# Patient Record
Sex: Female | Born: 1940
Health system: Southern US, Community
[De-identification: ages and names within clinical notes are randomized; demographics above are authoritative.]

## PROBLEM LIST (undated history)

## (undated) DIAGNOSIS — R42 Dizziness and giddiness: Secondary | ICD-10-CM

## (undated) DIAGNOSIS — R634 Abnormal weight loss: Secondary | ICD-10-CM

## (undated) DIAGNOSIS — E875 Hyperkalemia: Secondary | ICD-10-CM

## (undated) DIAGNOSIS — J019 Acute sinusitis, unspecified: Secondary | ICD-10-CM

## (undated) DIAGNOSIS — H9311 Tinnitus, right ear: Secondary | ICD-10-CM

## (undated) DIAGNOSIS — I10 Essential (primary) hypertension: Secondary | ICD-10-CM

## (undated) DIAGNOSIS — D649 Anemia, unspecified: Secondary | ICD-10-CM

## (undated) DIAGNOSIS — N182 Chronic kidney disease, stage 2 (mild): Secondary | ICD-10-CM

## (undated) DIAGNOSIS — Z634 Disappearance and death of family member: Secondary | ICD-10-CM

## (undated) DIAGNOSIS — J189 Pneumonia, unspecified organism: Secondary | ICD-10-CM

## (undated) DIAGNOSIS — I129 Hypertensive chronic kidney disease with stage 1 through stage 4 chronic kidney disease, or unspecified chronic kidney disease: Secondary | ICD-10-CM

## (undated) DIAGNOSIS — T7840XA Allergy, unspecified, initial encounter: Secondary | ICD-10-CM

## (undated) HISTORY — DX: Anemia, unspecified: D64.9

## (undated) HISTORY — DX: Essential (primary) hypertension: I10

## (undated) HISTORY — DX: Abnormal weight loss: R63.4

## (undated) HISTORY — DX: Disappearance and death of family member: Z63.4

## (undated) HISTORY — DX: Tinnitus, right ear: H93.11

## (undated) HISTORY — DX: Chronic kidney disease, stage 2 (mild): N18.2

## (undated) HISTORY — DX: Hypertensive chronic kidney disease with stage 1 through stage 4 chronic kidney disease, or unspecified chronic kidney disease: I12.9

## (undated) HISTORY — DX: Hyperkalemia: E87.5

## (undated) HISTORY — DX: Dizziness and giddiness: R42

## (undated) HISTORY — DX: Acute sinusitis, unspecified: J01.90

## (undated) HISTORY — PX: NO PAST SURGERIES: SHX2092

## (undated) HISTORY — DX: Allergy, unspecified, initial encounter: T78.40XA

## (undated) HISTORY — DX: Pneumonia, unspecified organism: J18.9

---

## 2001-10-22 ENCOUNTER — Emergency Department (HOSPITAL_COMMUNITY): Admission: EM | Admit: 2001-10-22 | Discharge: 2001-10-22 | Payer: Self-pay | Admitting: *Deleted

## 2001-10-22 ENCOUNTER — Encounter: Payer: Self-pay | Admitting: *Deleted

## 2005-01-28 ENCOUNTER — Emergency Department (HOSPITAL_COMMUNITY): Admission: EM | Admit: 2005-01-28 | Discharge: 2005-01-28 | Payer: Self-pay | Admitting: Emergency Medicine

## 2013-03-08 ENCOUNTER — Emergency Department (HOSPITAL_COMMUNITY)
Admission: EM | Admit: 2013-03-08 | Discharge: 2013-03-08 | Disposition: A | Discharge status: Home / Self Care | Payer: Medicare Other | Attending: Emergency Medicine | Admitting: Emergency Medicine

## 2013-03-08 ENCOUNTER — Encounter (HOSPITAL_COMMUNITY): Payer: Self-pay | Admitting: Emergency Medicine

## 2013-03-08 DIAGNOSIS — Z87891 Personal history of nicotine dependence: Secondary | ICD-10-CM | POA: Insufficient documentation

## 2013-03-08 DIAGNOSIS — I10 Essential (primary) hypertension: Secondary | ICD-10-CM | POA: Insufficient documentation

## 2013-03-08 DIAGNOSIS — R42 Dizziness and giddiness: Secondary | ICD-10-CM

## 2013-03-08 HISTORY — DX: Dizziness and giddiness: R42

## 2013-03-08 HISTORY — DX: Essential (primary) hypertension: I10

## 2013-03-08 NOTE — Discharge Instructions (Signed)
Your exam shows you have had an episode of vertigo, which causes a false sense of movement such as a spinning feeling or walls that seem to move.  Most vertigo is caused by a (usually temporary) problem in the inner ear. Rarely, the back part of the brain can cause vertigo (some mini-strokes / TIA's / strokes), but it appears to be a low risk cause for you at this time. It is important to follow-up with your doctor however, to see if you need further testing.  °Do not drive or participate in potentially dangerous activities requiring balance unless off meds (not drowsy) and the vertigo has resolved. °Most of the time benign vertigo is much better after a few days. However, mild unsteadiness may last for up to 3 months in some patients. An MRI scan or other special tests to evaluate your hearing and balance may be needed if the vertigo does not improve or returns in the future. RETURN IMMEDIATELY IF YOU HAVE ANY OF THE FOLLOWING (call 911): °Increasing vertigo, earache, ear drainage, or loss of hearing.  °Severe headache, blurred or double vision, or trouble walking.  °Fainting or poorly responsive, extreme weakness, chest pain, or palpitations.  °Fever, persistent vomiting, or dehydration.  °Numbness, tingling, incoordination, or weakness of the limbs.  °Change in speech, vision, swallowing, understanding, or other concerns. °

## 2013-03-08 NOTE — ED Notes (Signed)
Patient states she had a "lump" behind her right ear that her PCP stated was a swollen gland on 02/10/13. She was given antibiotics for the infection. Patient states she has felt occasional dizziness since this date. Patient has a history of vertigo and hypertension, and states, "It feels the same as my vertigo did before." Patient denies nausea, vomiting, diarrhea, and chest pain.

## 2013-03-08 NOTE — ED Provider Notes (Signed)
CSN: [id number]     Arrival date & time 03/08/13  [id number] History  This chart was scribed for Hurman HornJohn M Negar Sieler, MD by Shari HeritageAisha Amuda, ED Scribe. The patient was seen in room [facility]. Patient's care was started at 9:12 AM.    Chief Complaint  Patient presents with  . Dizziness    The history is provided by the patient. No language interpreter was used.    HPI Comments: Joanne White is a 73 y.o. female with history of vertigo who presents to the Emergency Department complaining of intermittent episodes of sensation of motion for the past 3 weeks. She reports that she has had about 1 episode per week and they typically last a few seconds. Her most recent episode was last night and was accompanied by a rushing sensation in her ears. There was no ear pain or hearing loss. She denies dizziness or lightheadedness, headache, change in speech, vision, swallowing or understanding with these episodes. She denies numbness or weakness on one side of the body and denies incoordination. She has taken meclizine at home with some improvement. Patient states that she was seen by her PCP on 02/10/13 for a "lump" in her right upper neck. She was diagnosed with an infected gland and given antibiotics. She feels that these episodes began after her antibiotic course. She only has an intermittent history of vertigo and this is not a chronic problem. She also has a history of HTN and is treated with lisinopril and atenolol.    Past Medical History  Diagnosis Date  . Vertigo   . Hypertension    History reviewed. No pertinent past surgical history. Family History  Problem Relation Age of Onset  . Hypertension Father   . Heart attack Brother   . Hypertension Brother    History  Substance Use Topics  . Smoking status: Former Games developermoker  . Smokeless tobacco: Never Used  . Alcohol Use: No   OB History   Grav Para Term Preterm Abortions TAB SAB Ect Mult Living                 Review of Systems 10 Systems reviewed  and all are negative for acute change except as noted in the HPI.  Allergies  Review of patient's allergies indicates no known allergies.  Home Medications  No current outpatient prescriptions on file. Triage Vitals: BP 179/65  Pulse 65  Temp(Src) 97.6 F (36.4 C) (Oral)  Resp 16  SpO2 100% Physical Exam  Nursing note and vitals reviewed. Constitutional:  Awake, alert, nontoxic appearance with baseline speech for patient.  HENT:  Head: Atraumatic.  Right Ear: Tympanic membrane normal.  Left Ear: Tympanic membrane normal.  Mouth/Throat: No oropharyngeal exudate.  Eyes: EOM are normal. Pupils are equal, round, and reactive to light. Right eye exhibits no discharge. Left eye exhibits no discharge.  Neck: Neck supple.  Cardiovascular: Normal rate and regular rhythm.   No murmur heard. Pulmonary/Chest: Effort normal and breath sounds normal. No stridor. No respiratory distress. She has no wheezes. She has no rales. She exhibits no tenderness.  Abdominal: Soft. Bowel sounds are normal. She exhibits no mass. There is no tenderness. There is no rebound.  Musculoskeletal: She exhibits no tenderness.  Baseline ROM, moves extremities with no obvious new focal weakness.  Lymphadenopathy:    She has no cervical adenopathy.  Neurological:  Awake, alert, cooperative and aware of situation; motor strength bilaterally; sensation normal to light touch bilaterally; peripheral visual fields full to confrontation; no facial  asymmetry; tongue midline; major cranial nerves appear intact; no pronator drift, normal finger to nose bilaterally, baseline gait without new ataxia. No nystagmus. Negative test of skew.  Skin: No rash noted.  Psychiatric: She has a normal mood and affect.    ED Course  Procedures (including critical care time) ECG: Normal sinus rhythm, ventricular rate 64, normal axis, normal intervals, nonspecific ST abnormality, no comparison ECG immediately available: Muse interface not  working. DIAGNOSTIC STUDIES: Oxygen Saturation is 100% on room air, normal by my interpretation.    COORDINATION OF CARE: 9:23 AM- Pt stable in ED with no significant deterioration in condition. Patient informed of clinical course, understand medical decision-making process, and agree with plan.   MDM   1. Vertigo      I doubt any other EMC precluding discharge at this time including, but not necessarily limited to the following: Stroke  I personally performed the services described in this documentation, which was scribed in my presence. The recorded information has been reviewed and is accurate.    Hurman Horn, MD 03/08/13 [id number]

## 2014-02-24 ENCOUNTER — Encounter (HOSPITAL_COMMUNITY): Payer: Self-pay | Admitting: Cardiology

## 2014-02-24 ENCOUNTER — Emergency Department (HOSPITAL_COMMUNITY)
Admission: EM | Admit: 2014-02-24 | Discharge: 2014-02-24 | Disposition: A | Discharge status: Home / Self Care | Payer: Medicare Other | Attending: Emergency Medicine | Admitting: Emergency Medicine

## 2014-02-24 ENCOUNTER — Emergency Department (HOSPITAL_COMMUNITY): Payer: Medicare Other

## 2014-02-24 DIAGNOSIS — R1011 Right upper quadrant pain: Secondary | ICD-10-CM | POA: Insufficient documentation

## 2014-02-24 DIAGNOSIS — I10 Essential (primary) hypertension: Secondary | ICD-10-CM | POA: Diagnosis not present

## 2014-02-24 DIAGNOSIS — Z87891 Personal history of nicotine dependence: Secondary | ICD-10-CM | POA: Diagnosis not present

## 2014-02-24 DIAGNOSIS — K8020 Calculus of gallbladder without cholecystitis without obstruction: Secondary | ICD-10-CM | POA: Diagnosis not present

## 2014-02-24 DIAGNOSIS — K8080 Other cholelithiasis without obstruction: Secondary | ICD-10-CM | POA: Diagnosis not present

## 2014-02-24 LAB — URINALYSIS, ROUTINE W REFLEX MICROSCOPIC
Glucose, UA: NEGATIVE mg/dL
Leukocytes, UA: NEGATIVE
Nitrite: NEGATIVE
Protein, ur: NEGATIVE mg/dL
Specific Gravity, Urine: 1.025 (ref 1.005–1.030)
Urobilinogen, UA: 0.2 mg/dL (ref 0.0–1.0)
pH: 5.5 (ref 5.0–8.0)

## 2014-02-24 LAB — LIPASE, BLOOD: Lipase: 24 U/L (ref 11–59)

## 2014-02-24 LAB — CBC WITH DIFFERENTIAL/PLATELET
Basophils Absolute: 0 10*3/uL (ref 0.0–0.1)
Basophils Relative: 0 % (ref 0–1)
Eosinophils Absolute: 0.1 10*3/uL (ref 0.0–0.7)
Eosinophils Relative: 2 % (ref 0–5)
HCT: 37 % (ref 36.0–46.0)
Hemoglobin: 13.3 g/dL (ref 12.0–15.0)
Lymphocytes Relative: 24 % (ref 12–46)
Lymphs Abs: 1.1 10*3/uL (ref 0.7–4.0)
MCH: 34.9 pg — ABNORMAL HIGH (ref 26.0–34.0)
MCHC: 35.9 g/dL (ref 30.0–36.0)
MCV: 97.1 fL (ref 78.0–100.0)
Monocytes Absolute: 0.4 10*3/uL (ref 0.1–1.0)
Monocytes Relative: 9 % (ref 3–12)
Neutro Abs: 3.1 10*3/uL (ref 1.7–7.7)
Neutrophils Relative %: 65 % (ref 43–77)
Platelets: 301 10*3/uL (ref 150–400)
RBC: 3.81 MIL/uL — ABNORMAL LOW (ref 3.87–5.11)
RDW: 12.4 % (ref 11.5–15.5)
WBC: 4.8 10*3/uL (ref 4.0–10.5)

## 2014-02-24 LAB — COMPREHENSIVE METABOLIC PANEL
ALT: 17 U/L (ref 0–35)
AST: 17 U/L (ref 0–37)
Albumin: 4.2 g/dL (ref 3.5–5.2)
Alkaline Phosphatase: 48 U/L (ref 39–117)
Anion gap: 7 (ref 5–15)
BUN: 15 mg/dL (ref 6–23)
CO2: 28 mmol/L (ref 19–32)
Calcium: 9.4 mg/dL (ref 8.4–10.5)
Chloride: 95 mEq/L — ABNORMAL LOW (ref 96–112)
Creatinine, Ser: 1.15 mg/dL — ABNORMAL HIGH (ref 0.50–1.10)
GFR calc Af Amer: 54 mL/min — ABNORMAL LOW (ref >90)
GFR calc non Af Amer: 46 mL/min — ABNORMAL LOW (ref >90)
Glucose, Bld: 115 mg/dL — ABNORMAL HIGH (ref 70–99)
Potassium: 4.8 mmol/L (ref 3.5–5.1)
Sodium: 130 mmol/L — ABNORMAL LOW (ref 135–145)
Total Bilirubin: 0.6 mg/dL (ref 0.3–1.2)
Total Protein: 6.7 g/dL (ref 6.0–8.3)

## 2014-02-24 LAB — URINE MICROSCOPIC-ADD ON

## 2014-02-24 NOTE — ED Notes (Signed)
Right sided abdominal  pain off and on times 2 days.  Worse this morning.  Some nausea.

## 2014-02-24 NOTE — ED Provider Notes (Signed)
CSN: [id number]     Arrival date & time 02/24/14  0914 History   First MD Initiated Contact with Patient 02/24/14 [phone]     Chief Complaint  Patient presents with  . Abdominal Pain     (Consider location/radiation/quality/duration/timing/severity/associated sxs/prior Treatment) HPI Comments: Pt c/o right upper abdominal pain that has been intermittent for the last 2 days. Pain was worse this morning. She states that she had a lot of gas and the pain resolved. Has had some nausea, no v/d, no fever.hasn't taken anything for symptoms. No cp or sob. Is not having any symptoms at this time. Didn't have breakfast this morning because of nausea, hasn't noticed it being associated with food. No cough or uri symptoms  The history is provided by the patient.    Past Medical History  Diagnosis Date  . Vertigo   . Hypertension    History reviewed. No pertinent past surgical history. Family History  Problem Relation Age of Onset  . Hypertension Father   . Heart attack Brother   . Hypertension Brother    History  Substance Use Topics  . Smoking status: Former Games developermoker  . Smokeless tobacco: Never Used  . Alcohol Use: No   OB History    No data available     Review of Systems  All other systems reviewed and are negative.     Allergies  Codeine  Home Medications   Prior to Admission medications   Not on File   BP 142/64 mmHg  Pulse 67  Temp(Src) 97.7 F (36.5 C) (Oral)  Resp 16  Ht 5\' 3"  (1.6 m)  Wt 136 lb (61.689 kg)  BMI 24.10 kg/m2  SpO2 100% Physical Exam  Constitutional: She is oriented to person, place, and time. She appears well-developed and well-nourished.  HENT:  Head: Atraumatic.  Neck: Normal range of motion. Neck supple.  Cardiovascular: Normal rate and regular rhythm.   Pulmonary/Chest: Effort normal and breath sounds normal.  Abdominal: Soft. Bowel sounds are normal. There is no tenderness.  Musculoskeletal: Normal range of motion.  Neurological: She is  oriented to person, place, and time.  Skin: Skin is warm and dry.  Psychiatric: She has a normal mood and affect.  Nursing note and vitals reviewed.   ED Course  Procedures (including critical care time) Labs Review Labs Reviewed  COMPREHENSIVE METABOLIC PANEL - Abnormal; Notable for the following:    Sodium 130 (*)    Chloride 95 (*)    Glucose, Bld 115 (*)    Creatinine, Ser 1.15 (*)    GFR calc non Af Amer 46 (*)    GFR calc Af Amer 54 (*)    All other components within normal limits  CBC WITH DIFFERENTIAL - Abnormal; Notable for the following:    RBC 3.81 (*)    MCH 34.9 (*)    All other components within normal limits  URINALYSIS, ROUTINE W REFLEX MICROSCOPIC - Abnormal; Notable for the following:    Hgb urine dipstick TRACE (*)    Bilirubin Urine SMALL (*)    Ketones, ur TRACE (*)    All other components within normal limits  URINE MICROSCOPIC-ADD ON - Abnormal; Notable for the following:    Bacteria, UA FEW (*)    All other components within normal limits  LIPASE, BLOOD    Imaging Review [place] Abdomen Complete  02/24/2014   CLINICAL DATA:  Right upper quadrant pain off and on for 2 days with nausea.  EXAM: ULTRASOUND ABDOMEN COMPLETE  COMPARISON:  None.  FINDINGS: Gallbladder: 2 small mobile gallstones are noted within the gallbladder, the largest measuring 11 mm. No wall thickening. Negative sonographic Murphy's.  Common bile duct: Diameter: Normal caliber, 6 mm.  Liver: No focal lesion identified. Within normal limits in parenchymal echogenicity.  IVC: No abnormality visualized.  Pancreas: Visualized portion unremarkable.  Spleen: Size and appearance within normal limits.  Right Kidney: Length: 10.4 cm. Echogenicity within normal limits. No mass or hydronephrosis visualized.  Left Kidney: Length: 10.1 cm. Echogenicity within normal limits. No mass or hydronephrosis visualized.  Abdominal aorta: No aneurysm visualized.  Other findings: None.  IMPRESSION: Cholelithiasis.  No  sonographic evidence of acute cholecystitis.   Electronically Signed   By: Charlett Nose M.D.   On: 02/24/2014 11:57     EKG Interpretation None      MDM   Final diagnoses:  Right upper quadrant abdominal pain  Cholelithiasis without cholecystitis    Pt continues to be comfortable at this time. Discussed with pt follow up. Pt requesting [facility].     Teressa Lower, NP 02/24/14 1217  Samuel Jester, DO 02/26/14 1347

## 2014-02-24 NOTE — Discharge Instructions (Signed)
Cholelithiasis °Cholelithiasis (also called gallstones) is a form of gallbladder disease in which gallstones form in your gallbladder. The gallbladder is an organ that stores bile made in the liver, which helps digest fats. Gallstones begin as small crystals and slowly grow into stones. Gallstone pain occurs when the gallbladder spasms and a gallstone is blocking the duct. Pain can also occur when a stone passes out of the duct.  °RISK FACTORS °· Being female.   °· Having multiple pregnancies. Health care providers sometimes advise removing diseased gallbladders before future pregnancies.   °· Being obese. °· Eating a diet heavy in fried foods and fat.   °· Being older than 60 years and increasing age.   °· Prolonged use of medicines containing female hormones.   °· Having diabetes mellitus.   °· Rapidly losing weight.   °· Having a family history of gallstones (heredity).   °SYMPTOMS °· Nausea.   °· Vomiting. °· Abdominal pain.   °· Yellowing of the skin (jaundice).   °· Sudden pain. It may persist from several minutes to several hours. °· Fever.   °· Tenderness to the touch.  °In some cases, when gallstones do not move into the bile duct, people have no pain or symptoms. These are called "silent" gallstones.  °TREATMENT °Silent gallstones do not need treatment. In severe cases, emergency surgery may be required. Options for treatment include: °· Surgery to remove the gallbladder. This is the most common treatment. °· Medicines. These do not always work and may take 6-12 months or more to work. °· Shock wave treatment (extracorporeal biliary lithotripsy). In this treatment an ultrasound machine sends shock waves to the gallbladder to break gallstones into smaller pieces that can pass into the intestines or be dissolved by medicine. °HOME CARE INSTRUCTIONS  °· Only take over-the-counter or prescription medicines for pain, discomfort, or fever as directed by your health care provider.   °· Follow a low-fat diet until  seen again by your health care provider. Fat causes the gallbladder to contract, which can result in pain.   °· Follow up with your health care provider as directed. Attacks are almost always recurrent and surgery is usually required for permanent treatment.   °SEEK IMMEDIATE MEDICAL CARE IF:  °· Your pain increases and is not controlled by medicines.   °· You have a fever or persistent symptoms for more than 2-3 days.   °· You have a fever and your symptoms suddenly get worse.   °· You have persistent nausea and vomiting.   °MAKE SURE YOU:  °· Understand these instructions. °· Will watch your condition. °· Will get help right away if you are not doing well or get worse. °Document Released: 01/26/2005 Document Revised: 10/02/2012 Document Reviewed: 07/24/2012 °[employer]® Patient Information ©2015 [employer]. This information is not intended to replace advice given to you by your health care provider. Make sure you discuss any questions you have with your health care provider. ° °

## 2014-03-02 ENCOUNTER — Inpatient Hospital Stay (HOSPITAL_COMMUNITY)
Admission: EM | Admit: 2014-03-02 | Discharge: 2014-03-05 | DRG: 445 | Disposition: A | Discharge status: Home / Self Care | Payer: Medicare Other | Attending: Family Medicine | Admitting: Family Medicine

## 2014-03-02 ENCOUNTER — Encounter (HOSPITAL_COMMUNITY): Payer: Self-pay | Admitting: Emergency Medicine

## 2014-03-02 DIAGNOSIS — R109 Unspecified abdominal pain: Secondary | ICD-10-CM | POA: Diagnosis present

## 2014-03-02 DIAGNOSIS — I10 Essential (primary) hypertension: Secondary | ICD-10-CM

## 2014-03-02 DIAGNOSIS — Z79899 Other long term (current) drug therapy: Secondary | ICD-10-CM

## 2014-03-02 DIAGNOSIS — N182 Chronic kidney disease, stage 2 (mild): Secondary | ICD-10-CM | POA: Diagnosis present

## 2014-03-02 DIAGNOSIS — Z7982 Long term (current) use of aspirin: Secondary | ICD-10-CM | POA: Diagnosis not present

## 2014-03-02 DIAGNOSIS — K8020 Calculus of gallbladder without cholecystitis without obstruction: Secondary | ICD-10-CM | POA: Diagnosis not present

## 2014-03-02 DIAGNOSIS — K8000 Calculus of gallbladder with acute cholecystitis without obstruction: Secondary | ICD-10-CM | POA: Diagnosis not present

## 2014-03-02 DIAGNOSIS — R1011 Right upper quadrant pain: Secondary | ICD-10-CM | POA: Diagnosis present

## 2014-03-02 DIAGNOSIS — K8010 Calculus of gallbladder with chronic cholecystitis without obstruction: Secondary | ICD-10-CM | POA: Diagnosis not present

## 2014-03-02 DIAGNOSIS — I129 Hypertensive chronic kidney disease with stage 1 through stage 4 chronic kidney disease, or unspecified chronic kidney disease: Secondary | ICD-10-CM | POA: Diagnosis not present

## 2014-03-02 DIAGNOSIS — Z87891 Personal history of nicotine dependence: Secondary | ICD-10-CM

## 2014-03-02 DIAGNOSIS — E871 Hypo-osmolality and hyponatremia: Secondary | ICD-10-CM | POA: Diagnosis not present

## 2014-03-02 HISTORY — DX: Essential (primary) hypertension: I10

## 2014-03-02 LAB — COMPREHENSIVE METABOLIC PANEL
ALT: 14 U/L (ref 0–35)
AST: 18 U/L (ref 0–37)
Albumin: 4.4 g/dL (ref 3.5–5.2)
Alkaline Phosphatase: 52 U/L (ref 39–117)
Anion gap: 10 (ref 5–15)
BUN: 16 mg/dL (ref 6–23)
CO2: 24 mmol/L (ref 19–32)
Calcium: 9.4 mg/dL (ref 8.4–10.5)
Chloride: 90 mEq/L — ABNORMAL LOW (ref 96–112)
Creatinine, Ser: 1.07 mg/dL (ref 0.50–1.10)
GFR calc Af Amer: 59 mL/min — ABNORMAL LOW (ref >90)
GFR calc non Af Amer: 51 mL/min — ABNORMAL LOW (ref >90)
Glucose, Bld: 92 mg/dL (ref 70–99)
Potassium: 3.9 mmol/L (ref 3.5–5.1)
Sodium: 124 mmol/L — ABNORMAL LOW (ref 135–145)
Total Bilirubin: 0.7 mg/dL (ref 0.3–1.2)
Total Protein: 7.1 g/dL (ref 6.0–8.3)

## 2014-03-02 LAB — CBC WITH DIFFERENTIAL/PLATELET
Basophils Absolute: 0 10*3/uL (ref 0.0–0.1)
Basophils Relative: 0 % (ref 0–1)
Eosinophils Absolute: 0 10*3/uL (ref 0.0–0.7)
Eosinophils Relative: 1 % (ref 0–5)
HCT: 37.7 % (ref 36.0–46.0)
Hemoglobin: 13.6 g/dL (ref 12.0–15.0)
Lymphocytes Relative: 25 % (ref 12–46)
Lymphs Abs: 1.4 10*3/uL (ref 0.7–4.0)
MCH: 34.2 pg — ABNORMAL HIGH (ref 26.0–34.0)
MCHC: 36.1 g/dL — ABNORMAL HIGH (ref 30.0–36.0)
MCV: 94.7 fL (ref 78.0–100.0)
Monocytes Absolute: 0.5 10*3/uL (ref 0.1–1.0)
Monocytes Relative: 8 % (ref 3–12)
Neutro Abs: 3.8 10*3/uL (ref 1.7–7.7)
Neutrophils Relative %: 66 % (ref 43–77)
Platelets: 307 10*3/uL (ref 150–400)
RBC: 3.98 MIL/uL (ref 3.87–5.11)
RDW: 12 % (ref 11.5–15.5)
WBC: 5.7 10*3/uL (ref 4.0–10.5)

## 2014-03-02 LAB — BASIC METABOLIC PANEL
Anion gap: 8 (ref 5–15)
BUN: 17 mg/dL (ref 6–23)
CO2: 28 mmol/L (ref 19–32)
Calcium: 8.9 mg/dL (ref 8.4–10.5)
Chloride: 89 mEq/L — ABNORMAL LOW (ref 96–112)
Creatinine, Ser: 1.23 mg/dL — ABNORMAL HIGH (ref 0.50–1.10)
GFR calc Af Amer: 50 mL/min — ABNORMAL LOW (ref >90)
GFR calc non Af Amer: 43 mL/min — ABNORMAL LOW (ref >90)
Glucose, Bld: 94 mg/dL (ref 70–99)
Potassium: 3.9 mmol/L (ref 3.5–5.1)
Sodium: 125 mmol/L — ABNORMAL LOW (ref 135–145)

## 2014-03-02 LAB — LIPASE, BLOOD: Lipase: 29 U/L (ref 11–59)

## 2014-03-02 MED ORDER — ONDANSETRON 8 MG PO TBDP
8.0000 mg | ORAL_TABLET | Freq: Once | ORAL | Status: AC
  Administered 2014-03-02: 8 mg via ORAL
  Filled 2014-03-02: qty 1

## 2014-03-02 MED ORDER — HEPARIN SODIUM (PORCINE) 5000 UNIT/ML IJ SOLN
5000.0000 [IU] | Freq: Three times a day (TID) | INTRAMUSCULAR | Status: DC
  Administered 2014-03-02 – 2014-03-05 (×7): 5000 [IU] via SUBCUTANEOUS
  Filled 2014-03-02 (×12): qty 1

## 2014-03-02 MED ORDER — OXYCODONE HCL 5 MG PO TABS
5.0000 mg | ORAL_TABLET | ORAL | Status: DC | PRN
  Administered 2014-03-04 – 2014-03-05 (×3): 5 mg via ORAL
  Filled 2014-03-02 (×3): qty 1

## 2014-03-02 MED ORDER — ONDANSETRON HCL 4 MG PO TABS
4.0000 mg | ORAL_TABLET | Freq: Four times a day (QID) | ORAL | Status: DC | PRN
  Administered 2014-03-05: 4 mg via ORAL
  Filled 2014-03-02: qty 1

## 2014-03-02 MED ORDER — ASPIRIN EC 81 MG PO TBEC
81.0000 mg | DELAYED_RELEASE_TABLET | Freq: Every day | ORAL | Status: DC
  Administered 2014-03-03 – 2014-03-05 (×2): 81 mg via ORAL
  Filled 2014-03-02 (×5): qty 1

## 2014-03-02 MED ORDER — LISINOPRIL 40 MG PO TABS
40.0000 mg | ORAL_TABLET | Freq: Every day | ORAL | Status: DC
  Administered 2014-03-03 – 2014-03-05 (×2): 40 mg via ORAL
  Filled 2014-03-02 (×5): qty 1

## 2014-03-02 MED ORDER — ONDANSETRON HCL 4 MG/2ML IJ SOLN: 4.0000 mg | Freq: Four times a day (QID) | INTRAMUSCULAR | Status: DC | PRN

## 2014-03-02 MED ORDER — SODIUM CHLORIDE 0.9 % IV SOLN
INTRAVENOUS | Status: DC
  Administered 2014-03-02 – 2014-03-03 (×2): via INTRAVENOUS
  Administered 2014-03-03 – 2014-03-04 (×2): 1000 mL via INTRAVENOUS

## 2014-03-02 MED ORDER — OXYCODONE-ACETAMINOPHEN 5-325 MG PO TABS
2.0000 | ORAL_TABLET | Freq: Once | ORAL | Status: AC
Start: 2014-03-02 — End: 2014-03-02
  Administered 2014-03-02: 2 via ORAL
  Filled 2014-03-02: qty 2

## 2014-03-02 NOTE — ED Provider Notes (Signed)
CSN: [id number]     Arrival date & time 03/02/14  1009 History   First MD Initiated Contact with Patient 03/02/14 1014     Chief Complaint  Patient presents with  . Abdominal Pain     (Consider location/radiation/quality/duration/timing/severity/associated sxs/prior Treatment) HPI   74 year old female with abdominal pain and nausea. Patient was seen for similar symptoms in the emergency room on January 12. Diagnosed with cholelithiasis. She reports upcoming surgical appointment with [facility] this Friday. She's had increased nausea and anorexia. No vomiting. Her pain has not significantly changed with regards to character or location. She reports that she was not prescribed any pain or nausea medication. No fever. Urinary complaints. No respiratory complaints. No sick contacts. No diarrhea.   Past Medical History  Diagnosis Date  . Vertigo   . Hypertension    History reviewed. No pertinent past surgical history. Family History  Problem Relation Age of Onset  . Hypertension Father   . Heart attack Brother   . Hypertension Brother    History  Substance Use Topics  . Smoking status: Former Games developermoker  . Smokeless tobacco: Never Used  . Alcohol Use: No   OB History    No data available     Review of Systems  All systems reviewed and negative, other than as noted in HPI.   Allergies  Codeine  Home Medications   Prior to Admission medications   Medication Sig Start Date End Date Taking? Authorizing Provider  aspirin EC 81 MG tablet Take 81 mg by mouth daily.    Historical Provider, MD  hydrochlorothiazide (HYDRODIURIL) 25 MG tablet Take 25 mg by mouth daily. 02/09/14   Historical Provider, MD  lisinopril (PRINIVIL,ZESTRIL) 40 MG tablet Take 40 mg by mouth daily. 02/09/14   Historical Provider, MD  Omega-3 Fatty Acids (FISH OIL) 1000 MG CAPS Take 1,000 mg by mouth 2 (two) times daily.    Historical Provider, MD   BP 150/70 mmHg  Pulse 78  Temp(Src) 97.5 F  (36.4 C) (Oral)  Resp 15  SpO2 99% Physical Exam  Constitutional: She appears well-developed and well-nourished. No distress.  HENT:  Head: Normocephalic and atraumatic.  Eyes: Conjunctivae are normal. Right eye exhibits no discharge. Left eye exhibits no discharge.  Neck: Neck supple.  Cardiovascular: Normal rate, regular rhythm and normal heart sounds.  Exam reveals no gallop and no friction rub.   No murmur heard. Pulmonary/Chest: Effort normal and breath sounds normal. No respiratory distress.  Abdominal: Soft. She exhibits no distension. There is tenderness.  Mild epigastric tenderness and to lesser degree in the right upper quadrant. No rebound or guarding. No distention.  Genitourinary:  No cva tenderness  Musculoskeletal: She exhibits no edema or tenderness.  Neurological: She is alert.  Skin: Skin is warm and dry.  Psychiatric: She has a normal mood and affect. Her behavior is normal. Thought content normal.  Nursing note and vitals reviewed.   ED Course  Procedures (including critical care time) Labs Review Labs Reviewed  COMPREHENSIVE METABOLIC PANEL - Abnormal; Notable for the following:    Sodium 124 (*)    Chloride 90 (*)    GFR calc non Af Amer 51 (*)    GFR calc Af Amer 59 (*)    All other components within normal limits  CBC WITH DIFFERENTIAL - Abnormal; Notable for the following:    MCH 34.2 (*)    MCHC 36.1 (*)    All other components within normal limits  BASIC METABOLIC  PANEL - Abnormal; Notable for the following:    Sodium 125 (*)    Chloride 89 (*)    Creatinine, Ser 1.23 (*)    GFR calc non Af Amer 43 (*)    GFR calc Af Amer 50 (*)    All other components within normal limits  BASIC METABOLIC PANEL - Abnormal; Notable for the following:    Sodium 130 (*)    Creatinine, Ser 1.21 (*)    GFR calc non Af Amer 44 (*)    GFR calc Af Amer 51 (*)    All other components within normal limits  CBC - Abnormal; Notable for the following:    RBC 3.71 (*)     HCT 35.7 (*)    MCH 34.8 (*)    MCHC 36.1 (*)    All other components within normal limits  BASIC METABOLIC PANEL - Abnormal; Notable for the following:    Sodium 132 (*)    Creatinine, Ser 1.21 (*)    GFR calc non Af Amer 44 (*)    GFR calc Af Amer 51 (*)    All other components within normal limits  BASIC METABOLIC PANEL - Abnormal; Notable for the following:    Sodium 132 (*)    Potassium 3.4 (*)    Glucose, Bld 164 (*)    Creatinine, Ser 1.15 (*)    GFR calc non Af Amer 46 (*)    GFR calc Af Amer 54 (*)    All other components within normal limits  LIPASE, BLOOD    Imaging Review No results found.   EKG Interpretation None      MDM   Final diagnoses:  Hyponatremia   74yf with abdominal pain and nausea/anorexia. Recently seen for similar symptoms at [facility] and had US showing cholelithiasis w/o [place] evidence of cholecystitis. Lipase/LFTs were normal. Continued symptoms. Reports upcoming appointment with [facility] on Friday. Has not been taking anything for pain/nausea.   Her exam is fairly benign. Mild epigastric/RUQ tenderness w/o peritoneal signs. Afebrile. Appears well. Hyponatremic on last ED evaluation. Worsened today. Symptoms may be from biliary colic. Hyponatremia can cause nausea/anorexia as well though.    Raeford Razor, MD 03/03/14 1344

## 2014-03-02 NOTE — ED Notes (Signed)
Pt with Dx of gall stones, has appointment with presurgery on Friday, reports to ED for nausea and pain to RUQ.

## 2014-03-02 NOTE — Consult Note (Signed)
Reason for Consult:  Cholelithiasis with abdominal pain, nausea/anorexia Referring Physician: [name] is an 74 y.o. female.  HPI: this is a normally healthy 74 y/o female.  Last week she started having pain in the RUQ, and was seen at [facility].  Work up there showed an NA of 130 and a creatinine of 1.15. Other labs were normal.  US showed 2 mobile gallstones within the GB, the largest was 62m, there was no GB wall thickening or fluid, around the GB.  She was discharged home with recommendations to follow up with surgery for her GB.  She is set up to see Dr. RRosendo Groson Friday 03/06/14.  [facility] she has been doing well until Saturday she had some pain and nausea.  She went to bed and did fine.  She had an egg for breakfast the following Sunday AM, and did well initially then got some pain and nausea again.  No vomiting.  She was able to sleep last PM, but had pain again this AM so her son brought her to the ED for evaluation again.  Work up here now shows her Na down to 124, she has been eating mostly crackers since she got sick on Sunday after breakfast.  She says her pain is better now after medicine.  She is really asking for some food now and she is to be admitted for her hyponatremia.  We are ask to see and discuss need for surgery.  Past Medical History  Diagnosis Date  . Vertigo    Tobacco use Quit 2 years ago  44 years use prior to quitting.  <1PPD  . Hypertension     History reviewed. No pertinent past surgical history.  No surgical history  Family History  Problem Relation Age of Onset  . Hypertension Father   . Heart attack Brother   . Hypertension Brother     Social History:  reports that she has quit smoking. She has never used smokeless tobacco. She reports that she does not drink alcohol. Her drug history is not on file. Etoh:  Social Drugs:  None Tobacco:  < 1 PPD for 44 years, quit 2 years ago. Retired - lives alone     Allergies:   Allergies  Allergen Reactions  . Codeine Nausea And Vomiting   Prior to Admission medications   Medication Sig Start Date End Date Taking? Authorizing Provider  aspirin EC 81 MG tablet Take 81 mg by mouth daily.   Yes Historical Provider, MD  hydrochlorothiazide (HYDRODIURIL) 25 MG tablet Take 25 mg by mouth daily. 02/09/14  Yes Historical Provider, MD  lisinopril (PRINIVIL,ZESTRIL) 40 MG tablet Take 40 mg by mouth daily. 02/09/14  Yes Historical Provider, MD  Omega-3 Fatty Acids (FISH OIL) 1000 MG CAPS Take 1,000 mg by mouth 2 (two) times daily.   Yes Historical Provider, MD     Anti-infectives    None      Results for orders placed or performed during the hospital encounter of 03/02/14 (from the past 48 hour(s))  Comprehensive metabolic panel     Status: Abnormal   Collection Time: 03/02/14 11:25 AM  Result Value Ref Range   Sodium 124 (L) 135 - 145 mmol/L    Comment: Please note change in reference range.   Potassium 3.9 3.5 - 5.1 mmol/L    Comment: Please note change in reference range.   Chloride 90 (L) 96 - 112 mEq/L   CO2 24 19 - 32  mmol/L   Glucose, Bld 92 70 - 99 mg/dL   BUN 16 6 - 23 mg/dL   Creatinine, Ser 1.07 0.50 - 1.10 mg/dL   Calcium 9.4 8.4 - 10.5 mg/dL   Total Protein 7.1 6.0 - 8.3 g/dL   Albumin 4.4 3.5 - 5.2 g/dL   AST 18 0 - 37 U/L   ALT 14 0 - 35 U/L   Alkaline Phosphatase 52 39 - 117 U/L   Total Bilirubin 0.7 0.3 - 1.2 mg/dL   GFR calc non Af Amer 51 (L) >90 mL/min   GFR calc Af Amer 59 (L) >90 mL/min    Comment: (NOTE) The eGFR has been calculated using the CKD EPI equation. This calculation has not been validated in all clinical situations. eGFR's persistently <90 mL/min signify possible Chronic Kidney Disease.    Anion gap 10 5 - 15  Lipase, blood     Status: None   Collection Time: 03/02/14 11:25 AM  Result Value Ref Range   Lipase 29 11 - 59 U/L  CBC with Differential     Status: Abnormal   Collection Time: 03/02/14 11:25 AM  Result  Value Ref Range   WBC 5.7 4.0 - 10.5 K/uL   RBC 3.98 3.87 - 5.11 MIL/uL   Hemoglobin 13.6 12.0 - 15.0 g/dL   HCT 37.7 36.0 - 46.0 %   MCV 94.7 78.0 - 100.0 fL   MCH 34.2 (H) 26.0 - 34.0 pg   MCHC 36.1 (H) 30.0 - 36.0 g/dL   RDW 12.0 11.5 - 15.5 %   Platelets 307 150 - 400 K/uL   Neutrophils Relative % 66 43 - 77 %   Neutro Abs 3.8 1.7 - 7.7 K/uL   Lymphocytes Relative 25 12 - 46 %   Lymphs Abs 1.4 0.7 - 4.0 K/uL   Monocytes Relative 8 3 - 12 %   Monocytes Absolute 0.5 0.1 - 1.0 K/uL   Eosinophils Relative 1 0 - 5 %   Eosinophils Absolute 0.0 0.0 - 0.7 K/uL   Basophils Relative 0 0 - 1 %   Basophils Absolute 0.0 0.0 - 0.1 K/uL    No results found.  Review of Systems  Constitutional: Negative for fever, chills, weight loss, malaise/fatigue and diaphoresis.  HENT: Negative.   Eyes: Negative.   Respiratory: Negative.   Gastrointestinal: Positive for nausea and abdominal pain (ruq ). Negative for heartburn, vomiting, diarrhea, constipation, blood in stool and melena.  Genitourinary: Negative.   Musculoskeletal: Negative.   Neurological: Negative.   Endo/Heme/Allergies: Negative.   Psychiatric/Behavioral: Negative.    Blood pressure 150/70, pulse 78, temperature 97.5 F (36.4 C), temperature source Oral, resp. rate 15, SpO2 99 %. Physical Exam  Constitutional: She is oriented to person, place, and time. She appears well-developed and well-nourished. No distress.  HENT:  Head: Normocephalic and atraumatic.  Nose: Nose normal.  Eyes: Conjunctivae and EOM are normal. Pupils are equal, round, and reactive to light. Right eye exhibits no discharge. Left eye exhibits no discharge. No scleral icterus.  Neck: Normal range of motion. Neck supple. No JVD present. No tracheal deviation present. No thyromegaly present.  Cardiovascular: Normal rate, regular rhythm, normal heart sounds and intact distal pulses.   No murmur heard. Respiratory: Effort normal and breath sounds normal. No  respiratory distress. She has no wheezes. She has no rales. She exhibits no tenderness.  GI: Soft. Bowel sounds are normal. She exhibits no distension and no mass. There is tenderness (some mild tenderness RUQ).  There is no rebound and no guarding.  Musculoskeletal: She exhibits no edema or tenderness.  Lymphadenopathy:    She has no cervical adenopathy.  Neurological: She is alert and oriented to person, place, and time. No cranial nerve deficit.  Skin: Skin is warm and dry. No rash noted. She is not diaphoretic. No erythema. No pallor.  Psychiatric: She has a normal mood and affect. Her behavior is normal. Judgment and thought content normal.    Assessment/Plan: 1.  Hyponatremia 2.  Cholelithiasis with some possible biliary colic 3.  Hypertension. 4.  Hx of tobacco use  Plan:  Currently she is not symptomatic, work up is unremarkable right now.  She is being admitted for hyponatremia, and I will discuss with Dr. Marcello Moores.  Once her hyponatremia is better it may be easier to do her GB while she is here.  Will look at labs, workup and schedule to make final decision.  Ousmane Seeman 03/02/2014, 1:38 PM

## 2014-03-02 NOTE — H&P (Signed)
[facility] History and Physical  Joanne White ZOX:[id number] DOB: [birth year] DOA: 03/02/2014  Referring physician: Dr. Juleen China PCP: Catalina Pizza, MD   Chief Complaint: Abdominal discomfort and low sodium levels  HPI: Joanne White is a 74 y.o. female  Patient states that since last week she has developed abdominal discomfort. Was seen in the emergency department a few days ago and was told she had cholelithiasis. Was discharged home and since then has had abdominal discomfort on and off. Presented again today because of her abdominal discomfort. General surgery contacted and will follow-up on patient while she is in house. The patient reports that within those several days she has not been eating well. She denies any bright red blood per rectum or fevers. Given persistence symptoms were consulted for further medical evaluation recommendations.   Review of Systems:  Constitutional:  No weight loss, night sweats, Fevers, chills, fatigue.  HEENT:  No headaches, Difficulty swallowing,Tooth/dental problems,Sore throat,  No sneezing, itching, ear ache, nasal congestion, post nasal drip,  Cardio-vascular:  No chest pain, Orthopnea, PND, swelling in lower extremities, anasarca, dizziness, palpitations  GI:  No heartburn, indigestion, + abdominal pain, + nausea, vomiting, diarrhea, change in bowel habits, + loss of appetite  Resp:  No shortness of breath with exertion or at rest. No excess mucus, no productive cough, No non-productive cough, No coughing up of blood.No change in color of mucus.No wheezing.No chest wall deformity  Skin:  no rash or lesions.  GU:  no dysuria, change in color of urine, no urgency or frequency. No flank pain.  Musculoskeletal:  No joint pain or swelling. No decreased range of motion. No back pain.  Psych:  No change in mood or affect. No depression or anxiety. No memory loss.   Past Medical History  Diagnosis Date  . Vertigo   . Hypertension    Past  Surgical History  Procedure Laterality Date  . No past surgeries     Social History:  reports that she quit smoking about 3 years ago. She has never used smokeless tobacco. She reports that she drinks alcohol. She reports that she does not use illicit drugs.  Allergies  Allergen Reactions  . Codeine Nausea And Vomiting    Family History  Problem Relation Age of Onset  . Hypertension Father   . Heart attack Brother   . Hypertension Brother      Prior to Admission medications   Medication Sig Start Date End Date Taking? Authorizing Provider  aspirin EC 81 MG tablet Take 81 mg by mouth daily.   Yes Historical Provider, MD  hydrochlorothiazide (HYDRODIURIL) 25 MG tablet Take 25 mg by mouth daily. 02/09/14  Yes Historical Provider, MD  lisinopril (PRINIVIL,ZESTRIL) 40 MG tablet Take 40 mg by mouth daily. 02/09/14  Yes Historical Provider, MD  Omega-3 Fatty Acids (FISH OIL) 1000 MG CAPS Take 1,000 mg by mouth 2 (two) times daily.   Yes Historical Provider, MD   Physical Exam: Filed Vitals:   03/02/14 1018 03/02/14 1339 03/02/14 1506  BP: 150/70 150/75 137/63  Pulse: 78 78 69  Temp: 97.5 F (36.4 C)  98 F (36.7 C)  TempSrc: Oral  Oral  Resp: Height:    (1.6 m)  SpO2: 99% 99% 100%    Wt Readings from Last 3 Encounters:  02/24/14 61.689 kg (136 lb)    General:  Appears calm and comfortable Eyes: PERRL, normal lids, irises & conjunctiva ENT: grossly normal hearing, lips &  tongue Neck: no LAD, masses or thyromegaly Cardiovascular: RRR, no m/r/g. No LE edema. Respiratory: CTA bilaterally, no w/r/r. Normal respiratory effort. Abdomen: soft, nt, nd, negative murphys sign Skin: no rash or induration seen on limited exam Musculoskeletal: grossly normal tone BUE/BLE Psychiatric: grossly normal mood and affect, speech fluent and appropriate Neurologic: No new focal neurological symptoms, moves extremities equally          Labs on Admission:  Basic Metabolic  Panel:  Recent Labs Lab 02/24/14 0950 03/02/14 1125  NA 130* 124*  K 4.8 3.9  CL 95* 90*  CO2 28 24  GLUCOSE 115* 92  BUN 15 16  CREATININE 1.15* 1.07  CALCIUM 9.4 9.4   Liver Function Tests:  Recent Labs Lab 02/24/14 0950 03/02/14 1125  AST 17 18  ALT 17 14  ALKPHOS 48 52  BILITOT 0.6 0.7  PROT 6.7 7.1  ALBUMIN 4.2 4.4    Recent Labs Lab 02/24/14 0950 03/02/14 1125  LIPASE 24 29   No results for input(s): AMMONIA in the last 168 hours. CBC:  Recent Labs Lab 02/24/14 0950 03/02/14 1125  WBC 4.8 5.7  NEUTROABS 3.1 3.8  HGB 13.3 13.6  HCT 37.0 37.7  MCV 97.1 94.7  PLT 301 307   Cardiac Enzymes: No results for input(s): CKTOTAL, CKMB, CKMBINDEX, TROPONINI in the last 168 hours.  BNP (last 3 results) No results for input(s): PROBNP in the last 8760 hours. CBG: No results for input(s): GLUCAP in the last 168 hours.  Radiological Exams on Admission: No results found.   Assessment/Plan Active Problems: Hyponatremia - We'll provide normal saline rehydration and allow patient to eat. We'll hold off on oral intake and make patient nothing by mouth at midnight. - Assess sodium levels every 6 hours  Colicky RUQ abdominal pain - Most likely secondary to cholelithiasis - Low-fat diet and nothing by mouth past midnight  Cholelithiasis - General surgery consulted - Supportive care with antiemetics and opioids  Hypertension - Will hold hctz  - continue lisinopril  Code Status: full DVT Prophylaxis: Heparin Family Communication: discussed with patient and family at bedside Disposition Plan: pending improvement in condition  Time spent: > 45 minutes  Penny PiaVEGA, Manvi Guilliams [facility] Pager [phone]

## 2014-03-03 ENCOUNTER — Encounter (HOSPITAL_COMMUNITY): Payer: Self-pay | Admitting: Internal Medicine

## 2014-03-03 DIAGNOSIS — I10 Essential (primary) hypertension: Secondary | ICD-10-CM

## 2014-03-03 DIAGNOSIS — K8020 Calculus of gallbladder without cholecystitis without obstruction: Secondary | ICD-10-CM | POA: Diagnosis present

## 2014-03-03 HISTORY — DX: Calculus of gallbladder without cholecystitis without obstruction: K80.20

## 2014-03-03 HISTORY — DX: Essential (primary) hypertension: I10

## 2014-03-03 LAB — BASIC METABOLIC PANEL
Anion gap: 7 (ref 5–15)
Anion gap: 8 (ref 5–15)
Anion gap: 8 (ref 5–15)
BUN: 18 mg/dL (ref 6–23)
BUN: 16 mg/dL (ref 6–23)
BUN: 16 mg/dL (ref 6–23)
CO2: 27 mmol/L (ref 19–32)
CO2: 27 mmol/L (ref 19–32)
CO2: 27 mmol/L (ref 19–32)
Calcium: 8.8 mg/dL (ref 8.4–10.5)
Calcium: 9.2 mg/dL (ref 8.4–10.5)
Calcium: 9 mg/dL (ref 8.4–10.5)
Chloride: 96 mEq/L (ref 96–112)
Chloride: 97 mEq/L (ref 96–112)
Chloride: 97 mEq/L (ref 96–112)
Creatinine, Ser: 1.21 mg/dL — ABNORMAL HIGH (ref 0.50–1.10)
Creatinine, Ser: 1.21 mg/dL — ABNORMAL HIGH (ref 0.50–1.10)
Creatinine, Ser: 1.15 mg/dL — ABNORMAL HIGH (ref 0.50–1.10)
GFR calc Af Amer: 51 mL/min — ABNORMAL LOW (ref >90)
GFR calc Af Amer: 51 mL/min — ABNORMAL LOW (ref >90)
GFR calc Af Amer: 54 mL/min — ABNORMAL LOW (ref >90)
GFR calc non Af Amer: 44 mL/min — ABNORMAL LOW (ref >90)
GFR calc non Af Amer: 44 mL/min — ABNORMAL LOW (ref >90)
GFR calc non Af Amer: 46 mL/min — ABNORMAL LOW (ref >90)
Glucose, Bld: 83 mg/dL (ref 70–99)
Glucose, Bld: 89 mg/dL (ref 70–99)
Glucose, Bld: 164 mg/dL — ABNORMAL HIGH (ref 70–99)
Potassium: 3.9 mmol/L (ref 3.5–5.1)
Potassium: 4.5 mmol/L (ref 3.5–5.1)
Potassium: 3.4 mmol/L — ABNORMAL LOW (ref 3.5–5.1)
Sodium: 130 mmol/L — ABNORMAL LOW (ref 135–145)
Sodium: 132 mmol/L — ABNORMAL LOW (ref 135–145)
Sodium: 132 mmol/L — ABNORMAL LOW (ref 135–145)

## 2014-03-03 LAB — CBC
HCT: 35.7 % — ABNORMAL LOW (ref 36.0–46.0)
Hemoglobin: 12.9 g/dL (ref 12.0–15.0)
MCH: 34.8 pg — ABNORMAL HIGH (ref 26.0–34.0)
MCHC: 36.1 g/dL — ABNORMAL HIGH (ref 30.0–36.0)
MCV: 96.2 fL (ref 78.0–100.0)
Platelets: 262 10*3/uL (ref 150–400)
RBC: 3.71 MIL/uL — ABNORMAL LOW (ref 3.87–5.11)
RDW: 12.2 % (ref 11.5–15.5)
WBC: 4.1 10*3/uL (ref 4.0–10.5)

## 2014-03-03 LAB — SURGICAL PCR SCREEN
MRSA, PCR: NEGATIVE
Staphylococcus aureus: NEGATIVE

## 2014-03-03 MED ORDER — POTASSIUM CHLORIDE CRYS ER 20 MEQ PO TBCR
40.0000 meq | EXTENDED_RELEASE_TABLET | Freq: Once | ORAL | Status: AC
  Administered 2014-03-03: 40 meq via ORAL
  Filled 2014-03-03: qty 2

## 2014-03-03 MED ORDER — DEXTROSE 5 % IV SOLN
2.0000 g | INTRAVENOUS | Status: AC
  Administered 2014-03-04: 2 g via INTRAVENOUS
  Filled 2014-03-03: qty 2

## 2014-03-03 NOTE — Progress Notes (Signed)
[facility] PROGRESS NOTE  Eppie GibsonBrenda L Bart ZOX:[id number] DOB: [birth year] DOA: 03/02/2014 PCP: Catalina PizzaHALL, ZACH, MD  Assessment/Plan: #1 hyponatremia Secondary to hypovolemic hyponatremia secondary to poor oral intake in the setting of diuretics. Patient's hyponatremia has improved on IV fluids. Diuretics on hold. Continue hydration. Follow.  #2 cholelithiasis/colicky abdominal pain Currently asymptomatic. Patient has been seen by general surgery and patient for probable laparoscopic cholecystectomy tomorrow. Will make patient nothing by mouth after midnight. General surgery following and appreciate the recommendations.  #3 hypertension Stable. Continue lisinopril. HCTZ on hold.  #4 prophylaxis Heparin for DVT prophylaxis.   Code Status: full Family Communication: updated patient and brother at bedside. Disposition Plan: Remain inpatient.   Consultants:  Gen. surgery: Dr. Maisie Fushomas 03/02/2014  Procedures:  None  Antibiotics:  None  HPI/Subjective: Patient denies any abdominal pain. No nausea, no vomiting.  Objective: Filed Vitals:   03/03/14 1004  BP: 146/64  Pulse: 64  Temp:   Resp:     Intake/Output Summary (Last 24 hours) at 03/03/14 1054 Last data filed at 03/03/14 0528  Gross per 24 hour  Intake 1216.25 ml  Output      0 ml  Net 1216.25 ml   There were no vitals filed for this visit.  Exam:   General:  NAD  Cardiovascular: RRR  Respiratory: CTAB  Abdomen: Soft, nontender, nondistended, positive bowel sounds.  Musculoskeletal: No clubbing cyanosis or edema.  Data Reviewed: Basic Metabolic Panel:  Recent Labs Lab 03/02/14 1125 03/02/14 1843 03/03/14 03/03/14 0705  NA 124* 125* 130* 132*  K 3.9 3.9 3.9 4.5  CL 90* 89* 96 97  CO2 24 28 27 27   GLUCOSE 92 94 83 89  BUN 16 17 18 16   CREATININE 1.07 1.23* 1.21* 1.21*  CALCIUM 9.4 8.9 8.8 9.2   Liver Function Tests:  Recent Labs Lab 03/02/14 1125  AST 18  ALT 14  ALKPHOS 52   BILITOT 0.7  PROT 7.1  ALBUMIN 4.4    Recent Labs Lab 03/02/14 1125  LIPASE 29   No results for input(s): AMMONIA in the last 168 hours. CBC:  Recent Labs Lab 03/02/14 1125 03/03/14 0705  WBC 5.7 4.1  NEUTROABS 3.8  --   HGB 13.6 12.9  HCT 37.7 35.7*  MCV 94.7 96.2  PLT 307 262   Cardiac Enzymes: No results for input(s): CKTOTAL, CKMB, CKMBINDEX, TROPONINI in the last 168 hours. BNP (last 3 results) No results for input(s): PROBNP in the last 8760 hours. CBG: No results for input(s): GLUCAP in the last 168 hours.  No results found for this or any previous visit (from the past 240 hour(s)).   Studies: No results found.  Scheduled Meds: . aspirin EC  81 mg Oral Daily  . heparin  5,000 Units Subcutaneous 3 times per day  . lisinopril  40 mg Oral Daily   Continuous Infusions: . sodium chloride 75 mL/hr at 03/03/14 0515    Principal Problem:   Hyponatremia Active Problems:   Colicky RUQ abdominal pain   Cholelithiasis   HTN (hypertension), benign    Time spent: 35 mins    [facility] Center LtdHOMPSON,Kalayna Noy MD [facility] Pager [phone]. If 7PM-7AM, please contact night-coverage at [email or web address], password [email or web address] 03/03/2014, 10:54 AM  LOS: 1 day

## 2014-03-03 NOTE — Progress Notes (Signed)
Patient's potassium level dropped to 3.4 from 4.5 this am,Dr. Janee Mornhompson notified,ordered potassium 40meq po once.Hulda Marin- Mervil Wacker RN

## 2014-03-03 NOTE — Progress Notes (Signed)
  Subjective: She feels better, no acute issue with GB.  Objective: Vital signs in last 24 hours: Temp:  [97.5 F (36.4 C)-98.2 F (36.8 C)] 98.2 F (36.8 C) (01/19 0528) Pulse Rate:  [65-78] 71 (01/19 0528) Resp:  [15-18] 16 (01/19 0528) BP: (113-150)/(55-80) 142/55 mmHg (01/19 0528) SpO2:  [98 %-100 %] 98 % (01/19 0528) Last BM Date: 03/02/14 Afebrile, VSS Na better, creatinine is up to 1.21 WBC is normal Intake/Output from previous day: 01/18 0701 - 01/19 0700 In: 1216.3 [P.O.:240; I.V.:976.3] Out: -  Intake/Output this shift:    General appearance: alert, cooperative and no distress GI: soft, non-tender; bowel sounds normal; no masses,  no organomegaly  Lab Results:   Recent Labs  03/02/14 1125 03/03/14 0705  WBC 5.7 4.1  HGB 13.6 12.9  HCT 37.7 35.7*  PLT 307 262    BMET  Recent Labs  03/03/14 03/03/14 0705  NA 130* 132*  K 3.9 4.5  CL 96 97  CO2 27 27  GLUCOSE 83 89  BUN 18 16  CREATININE 1.21* 1.21*  CALCIUM 8.8 9.2   PT/INR No results for input(s): LABPROT, INR in the last 72 hours.   Recent Labs Lab 02/24/14 0950 03/02/14 1125  AST 17 18  ALT 17 14  ALKPHOS 48 52  BILITOT 0.6 0.7  PROT 6.7 7.1  ALBUMIN 4.2 4.4     Lipase     Component Value Date/Time   LIPASE 29 03/02/2014 1125     Studies/Results: No results found.  Medications: . aspirin EC  81 mg Oral Daily  . heparin  5,000 Units Subcutaneous 3 times per day  . lisinopril  40 mg Oral Daily    Assessment/Plan 1. Hyponatremia 2. Cholelithiasis with some possible biliary colic 3. Hypertension. 4. Hx of tobacco use  5. Mild renal insuffiencey  Plan:  We cannot get her in the schedule today and would prefer her renal function and Na be normal before we do surgery.  i have talked with Dr. Janee Mornhompson, and he will feed her today and make her NPO after MN for possible surgery tomorrow.  LOS: 1 day    Sonja Manseau 03/03/2014

## 2014-03-04 ENCOUNTER — Encounter (HOSPITAL_COMMUNITY)
Admission: EM | Disposition: A | Discharge status: Home / Self Care | Payer: Medicare Other | Source: Home / Self Care | Attending: Family Medicine

## 2014-03-04 ENCOUNTER — Inpatient Hospital Stay (HOSPITAL_COMMUNITY): Payer: Medicare Other | Admitting: Anesthesiology

## 2014-03-04 ENCOUNTER — Encounter (HOSPITAL_COMMUNITY): Payer: Self-pay

## 2014-03-04 HISTORY — PX: CHOLECYSTECTOMY: SHX55

## 2014-03-04 LAB — CBC
HCT: 33.2 % — ABNORMAL LOW (ref 36.0–46.0)
Hemoglobin: 11.8 g/dL — ABNORMAL LOW (ref 12.0–15.0)
MCH: 34.6 pg — ABNORMAL HIGH (ref 26.0–34.0)
MCHC: 35.5 g/dL (ref 30.0–36.0)
MCV: 97.4 fL (ref 78.0–100.0)
Platelets: 245 10*3/uL (ref 150–400)
RBC: 3.41 MIL/uL — ABNORMAL LOW (ref 3.87–5.11)
RDW: 12.3 % (ref 11.5–15.5)
WBC: 4.6 10*3/uL (ref 4.0–10.5)

## 2014-03-04 LAB — COMPREHENSIVE METABOLIC PANEL
ALT: 11 U/L (ref 0–35)
AST: 14 U/L (ref 0–37)
Albumin: 3.4 g/dL — ABNORMAL LOW (ref 3.5–5.2)
Alkaline Phosphatase: 40 U/L (ref 39–117)
Anion gap: 7 (ref 5–15)
BUN: 14 mg/dL (ref 6–23)
CO2: 24 mmol/L (ref 19–32)
Calcium: 8.7 mg/dL (ref 8.4–10.5)
Chloride: 103 mEq/L (ref 96–112)
Creatinine, Ser: 1.07 mg/dL (ref 0.50–1.10)
GFR calc Af Amer: 59 mL/min — ABNORMAL LOW (ref >90)
GFR calc non Af Amer: 51 mL/min — ABNORMAL LOW (ref >90)
Glucose, Bld: 93 mg/dL (ref 70–99)
Potassium: 4.1 mmol/L (ref 3.5–5.1)
Sodium: 134 mmol/L — ABNORMAL LOW (ref 135–145)
Total Bilirubin: 0.7 mg/dL (ref 0.3–1.2)
Total Protein: 5.5 g/dL — ABNORMAL LOW (ref 6.0–8.3)

## 2014-03-04 LAB — PROTIME-INR
INR: 1.06 (ref 0.00–1.49)
Prothrombin Time: 13.9 seconds (ref 11.6–15.2)

## 2014-03-04 SURGERY — LAPAROSCOPIC CHOLECYSTECTOMY
Anesthesia: General

## 2014-03-04 MED ORDER — LIDOCAINE HCL (CARDIAC) 20 MG/ML IV SOLN
INTRAVENOUS | Status: DC | PRN
  Administered 2014-03-04: 50 mg via INTRAVENOUS

## 2014-03-04 MED ORDER — ROCURONIUM BROMIDE 100 MG/10ML IV SOLN
INTRAVENOUS | Status: AC
  Filled 2014-03-04: qty 1

## 2014-03-04 MED ORDER — LIDOCAINE HCL (CARDIAC) 20 MG/ML IV SOLN
INTRAVENOUS | Status: AC
  Filled 2014-03-04: qty 5

## 2014-03-04 MED ORDER — FENTANYL CITRATE 0.05 MG/ML IJ SOLN
INTRAMUSCULAR | Status: DC | PRN
  Administered 2014-03-04 (×4): 50 ug via INTRAVENOUS

## 2014-03-04 MED ORDER — ONDANSETRON HCL 4 MG/2ML IJ SOLN
INTRAMUSCULAR | Status: DC | PRN
  Administered 2014-03-04: 4 mg via INTRAVENOUS

## 2014-03-04 MED ORDER — CEFOXITIN SODIUM 2 G IV SOLR
INTRAVENOUS | Status: AC
  Filled 2014-03-04: qty 2

## 2014-03-04 MED ORDER — BUPIVACAINE-EPINEPHRINE 0.25% -1:200000 IJ SOLN
INTRAMUSCULAR | Status: AC
  Filled 2014-03-04: qty 1

## 2014-03-04 MED ORDER — MIDAZOLAM HCL 2 MG/2ML IJ SOLN
INTRAMUSCULAR | Status: AC
  Filled 2014-03-04: qty 2

## 2014-03-04 MED ORDER — FENTANYL CITRATE 0.05 MG/ML IJ SOLN
INTRAMUSCULAR | Status: AC
  Filled 2014-03-04: qty 5

## 2014-03-04 MED ORDER — PROPOFOL 10 MG/ML IV BOLUS
INTRAVENOUS | Status: AC
  Filled 2014-03-04: qty 20

## 2014-03-04 MED ORDER — LACTATED RINGERS IV SOLN
INTRAVENOUS | Status: DC | PRN
  Administered 2014-03-04: 10:00:00 via INTRAVENOUS

## 2014-03-04 MED ORDER — ONDANSETRON HCL 4 MG/2ML IJ SOLN
INTRAMUSCULAR | Status: AC
  Filled 2014-03-04: qty 2

## 2014-03-04 MED ORDER — LACTATED RINGERS IV SOLN
INTRAVENOUS | Status: DC | PRN
  Administered 2014-03-04: 1000 mL via INTRAVENOUS

## 2014-03-04 MED ORDER — NEOSTIGMINE METHYLSULFATE 10 MG/10ML IV SOLN
INTRAVENOUS | Status: DC | PRN
Start: 2014-03-04 — End: 2014-03-04
  Administered 2014-03-04: 3 mg via INTRAVENOUS

## 2014-03-04 MED ORDER — GLYCOPYRROLATE 0.2 MG/ML IJ SOLN
INTRAMUSCULAR | Status: DC | PRN
  Administered 2014-03-04: 0.4 mg via INTRAVENOUS

## 2014-03-04 MED ORDER — MORPHINE SULFATE 10 MG/ML IJ SOLN: 2.0000 mg | INTRAMUSCULAR | Status: DC | PRN

## 2014-03-04 MED ORDER — NEOSTIGMINE METHYLSULFATE 10 MG/10ML IV SOLN
INTRAVENOUS | Status: AC
  Filled 2014-03-04: qty 1

## 2014-03-04 MED ORDER — PROPOFOL 10 MG/ML IV BOLUS
INTRAVENOUS | Status: DC | PRN
  Administered 2014-03-04: 110 mg via INTRAVENOUS

## 2014-03-04 MED ORDER — MIDAZOLAM HCL 5 MG/5ML IJ SOLN
INTRAMUSCULAR | Status: DC | PRN
  Administered 2014-03-04: 1 mg via INTRAVENOUS

## 2014-03-04 MED ORDER — LACTATED RINGERS IV SOLN
INTRAVENOUS | Status: DC
  Administered 2014-03-04: 1000 mL via INTRAVENOUS

## 2014-03-04 MED ORDER — LABETALOL HCL 5 MG/ML IV SOLN
INTRAVENOUS | Status: DC | PRN
  Administered 2014-03-04: 2.5 mg via INTRAVENOUS

## 2014-03-04 MED ORDER — BUPIVACAINE-EPINEPHRINE 0.25% -1:200000 IJ SOLN
INTRAMUSCULAR | Status: DC | PRN
  Administered 2014-03-04: 15 mL

## 2014-03-04 MED ORDER — HYDROMORPHONE HCL 1 MG/ML IJ SOLN: 0.2500 mg | INTRAMUSCULAR | Status: DC | PRN

## 2014-03-04 MED ORDER — METOCLOPRAMIDE HCL 5 MG/ML IJ SOLN
INTRAMUSCULAR | Status: DC | PRN
  Administered 2014-03-04: 5 mg via INTRAVENOUS

## 2014-03-04 MED ORDER — ROCURONIUM BROMIDE 100 MG/10ML IV SOLN
INTRAVENOUS | Status: DC | PRN
  Administered 2014-03-04: 40 mg via INTRAVENOUS

## 2014-03-04 MED ORDER — LABETALOL HCL 5 MG/ML IV SOLN
INTRAVENOUS | Status: AC
  Filled 2014-03-04: qty 4

## 2014-03-04 MED ORDER — GLYCOPYRROLATE 0.2 MG/ML IJ SOLN
INTRAMUSCULAR | Status: AC
  Filled 2014-03-04: qty 2

## 2014-03-04 SURGICAL SUPPLY — 29 items
APPLIER CLIP 5 13 M/L LIGAMAX5 (MISCELLANEOUS) ×3
APR CLP MED LRG 5 ANG JAW (MISCELLANEOUS) ×2
BAG SPEC RTRVL LRG 6X4 10 (ENDOMECHANICALS) ×2
CABLE HIGH FREQUENCY MONO STRZ (ELECTRODE) ×3 IMPLANT
CHLORAPREP W/TINT 26ML (MISCELLANEOUS) ×3 IMPLANT
CLIP APPLIE 5 13 M/L LIGAMAX5 (MISCELLANEOUS) ×2 IMPLANT
COVER MAYO STAND STRL (DRAPES) IMPLANT
DRAPE C-ARM 42X120 X-RAY (DRAPES) IMPLANT
DRAPE LAPAROSCOPIC ABDOMINAL (DRAPES) ×3 IMPLANT
ELECT REM PT RETURN 9FT ADLT (ELECTROSURGICAL) ×3
ELECTRODE REM PT RTRN 9FT ADLT (ELECTROSURGICAL) ×2 IMPLANT
GLOVE BIO SURGEON STRL SZ 6.5 (GLOVE) ×3 IMPLANT
GLOVE BIOGEL PI IND STRL 7.0 (GLOVE) ×2 IMPLANT
GLOVE BIOGEL PI INDICATOR 7.0 (GLOVE) ×1
GOWN L4 XXLG W/PAP TWL (GOWN DISPOSABLE) ×3 IMPLANT
GOWN SPEC L4 XLG W/TWL (GOWN DISPOSABLE) ×6 IMPLANT
KIT BASIN OR (CUSTOM PROCEDURE TRAY) ×3 IMPLANT
LIQUID BAND (GAUZE/BANDAGES/DRESSINGS) ×3 IMPLANT
POUCH SPECIMEN RETRIEVAL 10MM (ENDOMECHANICALS) ×3 IMPLANT
SCISSORS LAP 5X35 DISP (ENDOMECHANICALS) ×3 IMPLANT
SET CHOLANGIOGRAPH MIX (MISCELLANEOUS) IMPLANT
SET IRRIG TUBING LAPAROSCOPIC (IRRIGATION / IRRIGATOR) ×3 IMPLANT
SUT VIC AB 4-0 PS2 18 (SUTURE) ×3 IMPLANT
TOWEL OR 17X26 10 PK STRL BLUE (TOWEL DISPOSABLE) ×3 IMPLANT
TOWEL OR NON WOVEN STRL DISP B (DISPOSABLE) ×3 IMPLANT
TRAY LAPAROSCOPIC (CUSTOM PROCEDURE TRAY) ×3 IMPLANT
TROCAR BLADELESS OPT 5 75 (ENDOMECHANICALS) ×3 IMPLANT
TROCAR SLEEVE XCEL 5X75 (ENDOMECHANICALS) ×6 IMPLANT
TROCAR XCEL BLUNT TIP 100MML (ENDOMECHANICALS) ×3 IMPLANT

## 2014-03-04 NOTE — Progress Notes (Signed)
Joanne White ZOX:[id number] DOB: 29-Dec-1940 DOA: 03/02/2014 PCP: Catalina Pizza, MD  Brief narrative: 74 y/o ? 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 sodium/potassium.    Past medical history-As per Problem list Chart reviewed as below-   Consultants:  Gen surg  Procedures:  Lap Chole 1/20  Antibiotics:   none   Subjective   well Sore in abd No pain No n/v Tol Ginger ale so far   Objective    Interim History:   Telemetry:   Objective: Filed Vitals:   03/04/14 1215 03/04/14 1225 03/04/14 1341 03/04/14 1500  BP: 155/59 158/69 140/56 115/54  Pulse: 60 71 60 60  Temp: 97.6 F (36.4 C) 97.8 F (36.6 C) 98 F (36.7 C) 98.4 F (36.9 C)  TempSrc:   Oral Oral  Resp: Height:      SpO2: 100% 100% 100% 100%    Intake/Output Summary (Last 24 hours) at 03/04/14 1513 Last data filed at 03/04/14 1342  Gross per 24 hour  Intake 2502.5 ml  Output     10 ml  Net 2492.5 ml    Exam:  General: eomi, ncat pleasant Cardiovascular: s1 s 2no m/r/g Respiratory: clear no added sound Abdomen: surgical scar, abd scaphoid Skin no ;e edema Neuro none  Data Reviewed: Basic Metabolic Panel:  Recent Labs Lab 03/02/14 1843 03/03/14 03/03/14 0705 03/03/14 1149 03/04/14 0542  NA 125* 130* 132* 132* 134*  K 3.9 3.9 4.5 3.4* 4.1  CL 89* 96 97 97 103  CO2 GLUCOSE 94 83 89 164* 93  BUN CREATININE 1.23* 1.21* 1.21* 1.15* 1.07  CALCIUM 8.9 8.8 9.2 9.0 8.7   Liver Function Tests:  Recent Labs Lab 03/02/14 1125 03/04/14 0542  AST 18 14  ALT 14 11  ALKPHOS 52 40  BILITOT 0.7 0.7  PROT 7.1 5.5*  ALBUMIN 4.4 3.4*    Recent Labs Lab 03/02/14 1125  LIPASE 29   No results for input(s): AMMONIA in the last 168 hours. CBC:  Recent Labs Lab 03/02/14 1125 03/03/14 0705 03/04/14 0542  WBC 5.7 4.1 4.6  NEUTROABS 3.8  --   --   HGB 13.6 12.9  11.8*  HCT 37.7 35.7* 33.2*  MCV 94.7 96.2 97.4  PLT 307 262 245   Cardiac Enzymes: No results for input(s): CKTOTAL, CKMB, CKMBINDEX, TROPONINI in the last 168 hours. BNP: Invalid input(s): POCBNP CBG: No results for input(s): GLUCAP in the last 168 hours.  Recent Results (from the past 240 hour(s))  Surgical pcr screen     Status: None   Collection Time: 03/03/14  5:50 PM  Result Value Ref Range Status   MRSA, PCR NEGATIVE NEGATIVE Final   Staphylococcus aureus NEGATIVE NEGATIVE Final    Comment:        The Xpert SA Assay (FDA approved for NASAL specimens in patients over 63 years of age), is one component of a comprehensive surveillance program.  Test performance has been validated by [facility] for patients greater than or equal to 77 year old. It is not intended to diagnose infection nor to guide or monitor treatment.      Studies:              All Imaging reviewed and is as per above notation   Scheduled Meds: . aspirin EC  81 mg Oral Daily  . heparin  5,000 Units Subcutaneous 3 times per day  . lisinopril  40 mg Oral Daily   Continuous Infusions: . sodium chloride 1,000 mL (03/04/14 0724)     Assessment/Plan: 1. 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 NSL iv 3. Prior tob abuse-quit in 2013 4. Htn-cont Lisinopril 40  And monitor trends  Code Status:  full Family Communication:  None bedside Disposition Plan: likely d/c in am 1/21   Pleas KochJai Lachele Lievanos, MD  [facility] Pager [phone] 03/04/2014, 3:13 PM    LOS: 2 days

## 2014-03-04 NOTE — Anesthesia Postprocedure Evaluation (Signed)
  Anesthesia Post-op Note  Patient: Joanne White  Procedure(s) Performed: Procedure(s): LAPAROSCOPIC CHOLECYSTECTOMY  Patient Location: PACU  Anesthesia Type:General  Level of Consciousness: awake and alert   Airway and Oxygen Therapy: Patient Spontanous Breathing  Post-op Pain: mild  Post-op Assessment: Post-op Vital signs reviewed, Patient's Cardiovascular Status Stable and Respiratory Function Stable  Post-op Vital Signs: Reviewed  Filed Vitals:   03/04/14 1500  BP: 115/54  Pulse: 60  Temp: 36.9 C  Resp: 18    Complications: No apparent anesthesia complications

## 2014-03-04 NOTE — Anesthesia Preprocedure Evaluation (Addendum)
Anesthesia Evaluation  Patient identified by MRN, date of birth, ID band Patient awake    Reviewed: Allergy & Precautions, H&P , NPO status , Patient's Chart, lab work & pertinent test results  Airway Mallampati: II  TM Distance: >3 FB Neck ROM: Full    Dental no notable dental hx. (+) Teeth Intact, Dental Advisory Given   Pulmonary neg pulmonary ROS, former smoker,  breath sounds clear to auscultation  Pulmonary exam normal       Cardiovascular hypertension, Pt. on medications Rhythm:Regular Rate:Normal     Neuro/Psych negative neurological ROS  negative psych ROS   GI/Hepatic negative GI ROS, Neg liver ROS,   Endo/Other  negative endocrine ROS  Renal/GU negative Renal ROS  negative genitourinary   Musculoskeletal   Abdominal   Peds  Hematology negative hematology ROS (+)   Anesthesia Other Findings   Reproductive/Obstetrics negative OB ROS                            Anesthesia Physical Anesthesia Plan  ASA: II  Anesthesia Plan: General   Post-op Pain Management:    Induction: Intravenous  Airway Management Planned: Oral ETT  Additional Equipment:   Intra-op Plan:   Post-operative Plan: Extubation in OR  Informed Consent: I have reviewed the patients History and Physical, chart, labs and discussed the procedure including the risks, benefits and alternatives for the proposed anesthesia with the patient or authorized representative who has indicated his/her understanding and acceptance.   Dental advisory given  Plan Discussed with: CRNA  Anesthesia Plan Comments:         Anesthesia Quick Evaluation

## 2014-03-04 NOTE — Op Note (Signed)
03/02/2014 - 03/04/2014  11:26 AM  PATIENT:  Joanne White  74 y.o. female  Patient Care Team: Catalina Pizza, MD as PCP - General (Internal Medicine)  PRE-OPERATIVE DIAGNOSIS:  chronic cholelithiasis  POST-OPERATIVE DIAGNOSIS:  chronic cholecystitis  PROCEDURE:   LAPAROSCOPIC CHOLECYSTECTOMY   Surgeon(s): Romie Levee, MD  ASSISTANT: none   ANESTHESIA:   local and general  EBL:     DRAINS: none   SPECIMEN:  Source of Specimen:  Gallbladder  DISPOSITION OF SPECIMEN:  PATHOLOGY  COUNTS:  YES  PLAN OF CARE: Discharge to home after PACU  PATIENT DISPOSITION:  PACU - hemodynamically stable.  INDICATION: 74 y.o. F with signs and symptoms consistent with gallbladder disease.  Presented to the hospital with hyponatremia.  This was treated and it was decided to proceed with cholecystectomy.  The anatomy & physiology of hepatobiliary & pancreatic function was discussed.  The pathophysiology of gallbladder dysfunction was discussed.  Natural history risks without surgery was discussed.   I feel the risks of no intervention will lead to serious problems that outweigh the operative risks; therefore, I recommended cholecystectomy to remove the pathology.  I explained laparoscopic techniques with possible need for an open approach.  Probable cholangiogram to evaluate the bilary tract was explained as well.    Risks such as bleeding, infection, abscess, leak, injury to other organs, need for further treatment, heart attack, death, and other risks were discussed.  I noted a good likelihood this will help address the problem.  Possibility that this will not correct all abdominal symptoms was explained.  Goals of post-operative recovery were discussed as well.    OR FINDINGS: chronic cholecystitis appearing gallbladder.  Fibrotic liver with adhesions  DESCRIPTION:   The patient was identified & brought into the operating room. The patient was positioned supine with arms tucked. SCDs were  active during the entire case. The patient underwent general anesthesia without any difficulty.  The abdomen was prepped and draped in a sterile fashion. A Surgical Timeout was performed and confirmed our plan.  We positioned the patient in reverse [place] & right side up.  I placed a Hassan laparoscopic port through the umbilicus using open entry technique.  Entry was clean. There were no adhesions to the anterior abdominal wall supraumbilically.  We induced carbon dioxide insufflation. Camera inspection revealed no injury.    I proceeded to continue with laparoscopic technique. I placed a 5 mm port in mid subcostal region, another 5mm port in the right flank near the anterior axillary line, and a 5mm port in the left subxiphoid region obliquely within the falciform ligament.  I turned attention to the right upper quadrant.  The duodenum was adherent to the medial side of the gallbladder.  This was taken down sharply.  On several inspections there was no injury to duodenum noted.  The gallbladder fundus was elevated cephalad. I used cautery and blunt dissection to free the peritoneal coverings between the gallbladder and the liver on the posteriolateral and anteriomedial walls.   I used careful blunt and cautery dissection with a maryland dissector to help get a good critical view of the cystic artery and cystic duct. I did further dissection to free a few centimeters of the  gallbladder off the liver bed to get a good critical view of the infundibulum and cystic duct. I mobilized the cystic artery.  The cystic artery sat right on top of the cystic duct.  I skeletonized the cystic duct.  After getting a good 360  view, I decided to not to perform a cholangiogram.  I placed a clip on the infundibulum.  I placed clips on the cystic duct x3.  I completed cystic duct transection.   I placed clips on the cystic artery x3 with 2 proximally.  I ligated the cystic artery using scissors. I freed the gallbladder  from its remaining attachments to the liver. I ensured hemostasis on the gallbladder fossa of the liver and elsewhere. I inspected the rest of the abdomen & detected no injury nor bleeding elsewhere.  I irrigated the RUQ with normal saline.  I removed the gallbladder through the umbilical port site.  I closed the umbilical fascia using 0 Vicryl stitches.   I closed the skin using 4-0 vicryl stitch.  Sterile dressings were applied. The patient was extubated & arrived in the PACU in stable condition.  I had discussed postoperative care with the patient in the holding area.   I will discuss  operative findings and postoperative goals / instructions with the patient's family.  Instructions are written in the chart as well.

## 2014-03-04 NOTE — Progress Notes (Signed)
Pt ambulated in hallway with Nurse Tech.  Tolerated well, pt states great pain relief from PO oxy 5mg .

## 2014-03-04 NOTE — Progress Notes (Signed)
Pt tolerating liquids and crackers, requesting diet to be advanced.

## 2014-03-04 NOTE — Interval H&P Note (Signed)
History and Physical Interval Note:  03/04/2014 10:00 AM  Joanne White  has presented today for surgery, with the diagnosis of chronic cholelithiasis  The various methods of treatment have been discussed with the patient and family. After consideration of risks, benefits and other options for treatment, the patient has consented to  Procedure(s): LAPAROSCOPIC CHOLECYSTECTOMY WITH INTRAOPERATIVE CHOLANGIOGRAM (N/A) as a surgical intervention .  The patient's history has been reviewed, patient examined, no change in status, stable for surgery.  I have reviewed the patient's chart and labs.  Questions were answered to the patient's satisfaction.    The anatomy & physiology of hepatobiliary & pancreatic function was discussed.  The pathophysiology of gallbladder dysfunction was discussed.  Natural history risks without surgery was discussed.   I feel the risks of no intervention will lead to serious problems that outweigh the operative risks; therefore, I recommended cholecystectomy to remove the pathology.  I explained laparoscopic techniques with possible need for an open approach.  Probable cholangiogram to evaluate the bilary tract was explained as well.    Risks such as bleeding, infection, abscess, leak, injury to other organs, need for further treatment, heart attack, death, and other risks were discussed.  I noted a good likelihood this will help address the problem.  Possibility that this will not correct all abdominal symptoms was explained.  Goals of post-operative recovery were discussed as well.  We will work to minimize complications.  An educational handout further explaining the pathology and treatment options was given as well.  Questions were answered.  The patient expresses understanding & wishes to proceed with surgery.  Vanita PandaAlicia C Rayjon Wery, MD  Colorectal and General Surgery [facility]

## 2014-03-04 NOTE — Progress Notes (Signed)
[facility]ec[facility]. Patient denies pain. Incision sites to abdominal area,no bleeding noted. Vital signs checked and documented. Will continue to monitor the patient.- Hulda Marinonna Issac Moure RN

## 2014-03-04 NOTE — Transfer of Care (Signed)
Immediate Anesthesia Transfer of Care Note  Patient: Joanne White  Procedure(s) Performed: Procedure(s): LAPAROSCOPIC CHOLECYSTECTOMY  Patient Location: PACU  Anesthesia Type:General  Level of Consciousness: awake, alert , oriented and patient cooperative  Airway & Oxygen Therapy: Patient Spontanous Breathing and Patient connected to face mask oxygen  Post-op Assessment: Report given to PACU RN and Post -op Vital signs reviewed and stable  Post vital signs: Reviewed and stable  Complications: No apparent anesthesia complications

## 2014-03-04 NOTE — H&P (View-Only) (Signed)
Reason for Consult:  Cholelithiasis with abdominal pain, nausea/anorexia Referring Physician: [name] is an 74 y.o. female.  HPI: this is a normally healthy 74 y/o female.  Last week she started having pain in the RUQ, and was seen at [facility].  Work up there showed an NA of 130 and a creatinine of 1.15. Other labs were normal.  US showed 2 mobile gallstones within the GB, the largest was 43m, there was no GB wall thickening or fluid, around the GB.  She was discharged home with recommendations to follow up with surgery for her GB.  She is set up to see Dr. RRosendo Groson Friday 03/06/14.  [facility] well until Saturday she had some pain and nausea.  She went to bed and did fine.  She had an egg for breakfast the following Sunday AM, and did well initially then got some pain and nausea again.  No vomiting.  She was able to sleep last PM, but had pain again this AM so her son brought her to the ED for evaluation again.  Work up here now shows her Na down to 124, she has been eating mostly crackers since she got sick on Sunday after breakfast.  She says her pain is better now after medicine.  She is really asking for some food now and she is to be admitted for her hyponatremia.  We are ask to see and discuss need for surgery.  Past Medical History  Diagnosis Date  . Vertigo    Tobacco use Quit 2 years ago  44 years use prior to quitting.  <1PPD  . Hypertension     History reviewed. No pertinent past surgical history.  No surgical history  Family History  Problem Relation Age of Onset  . Hypertension Father   . Heart attack Brother   . Hypertension Brother     Social History:  reports that she has quit smoking. She has never used smokeless tobacco. She reports that she does not drink alcohol. Her drug history is not on file. Etoh:  Social Drugs:  None Tobacco:  < 1 PPD for 44 years, quit 2 years ago. Retired - lives alone     Allergies:   Allergies  Allergen Reactions  . Codeine Nausea And Vomiting   Prior to Admission medications   Medication Sig Start Date End Date Taking? Authorizing Provider  aspirin EC 81 MG tablet Take 81 mg by mouth daily.   Yes Historical Provider, MD  hydrochlorothiazide (HYDRODIURIL) 25 MG tablet Take 25 mg by mouth daily. 02/09/14  Yes Historical Provider, MD  lisinopril (PRINIVIL,ZESTRIL) 40 MG tablet Take 40 mg by mouth daily. 02/09/14  Yes Historical Provider, MD  Omega-3 Fatty Acids (FISH OIL) 1000 MG CAPS Take 1,000 mg by mouth 2 (two) times daily.   Yes Historical Provider, MD     Anti-infectives    None      Results for orders placed or performed during the hospital encounter of 03/02/14 (from the past 48 hour(s))  Comprehensive metabolic panel     Status: Abnormal   Collection Time: 03/02/14 11:25 AM  Result Value Ref Range   Sodium 124 (L) 135 - 145 mmol/L    Comment: Please note change in reference range.   Potassium 3.9 3.5 - 5.1 mmol/L    Comment: Please note change in reference range.   Chloride 90 (L) 96 - 112 mEq/L   CO2 24 19 - 32  mmol/L   Glucose, Bld 92 70 - 99 mg/dL   BUN 16 6 - 23 mg/dL   Creatinine, Ser 1.07 0.50 - 1.10 mg/dL   Calcium 9.4 8.4 - 10.5 mg/dL   Total Protein 7.1 6.0 - 8.3 g/dL   Albumin 4.4 3.5 - 5.2 g/dL   AST 18 0 - 37 U/L   ALT 14 0 - 35 U/L   Alkaline Phosphatase 52 39 - 117 U/L   Total Bilirubin 0.7 0.3 - 1.2 mg/dL   GFR calc non Af Amer 51 (L) >90 mL/min   GFR calc Af Amer 59 (L) >90 mL/min    Comment: (NOTE) The eGFR has been calculated using the CKD EPI equation. This calculation has not been validated in all clinical situations. eGFR's persistently <90 mL/min signify possible Chronic Kidney Disease.    Anion gap 10 5 - 15  Lipase, blood     Status: None   Collection Time: 03/02/14 11:25 AM  Result Value Ref Range   Lipase 29 11 - 59 U/L  CBC with Differential     Status: Abnormal   Collection Time: 03/02/14 11:25 AM  Result  Value Ref Range   WBC 5.7 4.0 - 10.5 K/uL   RBC 3.98 3.87 - 5.11 MIL/uL   Hemoglobin 13.6 12.0 - 15.0 g/dL   HCT 37.7 36.0 - 46.0 %   MCV 94.7 78.0 - 100.0 fL   MCH 34.2 (H) 26.0 - 34.0 pg   MCHC 36.1 (H) 30.0 - 36.0 g/dL   RDW 12.0 11.5 - 15.5 %   Platelets 307 150 - 400 K/uL   Neutrophils Relative % 66 43 - 77 %   Neutro Abs 3.8 1.7 - 7.7 K/uL   Lymphocytes Relative 25 12 - 46 %   Lymphs Abs 1.4 0.7 - 4.0 K/uL   Monocytes Relative 8 3 - 12 %   Monocytes Absolute 0.5 0.1 - 1.0 K/uL   Eosinophils Relative 1 0 - 5 %   Eosinophils Absolute 0.0 0.0 - 0.7 K/uL   Basophils Relative 0 0 - 1 %   Basophils Absolute 0.0 0.0 - 0.1 K/uL    No results found.  Review of Systems  Constitutional: Negative for fever, chills, weight loss, malaise/fatigue and diaphoresis.  HENT: Negative.   Eyes: Negative.   Respiratory: Negative.   Gastrointestinal: Positive for nausea and abdominal pain (ruq ). Negative for heartburn, vomiting, diarrhea, constipation, blood in stool and melena.  Genitourinary: Negative.   Musculoskeletal: Negative.   Neurological: Negative.   Endo/Heme/Allergies: Negative.   Psychiatric/Behavioral: Negative.    Blood pressure 150/70, pulse 78, temperature 97.5 F (36.4 C), temperature source Oral, resp. rate 15, SpO2 99 %. Physical Exam  Constitutional: She is oriented to person, place, and time. She appears well-developed and well-nourished. No distress.  HENT:  Head: Normocephalic and atraumatic.  Nose: Nose normal.  Eyes: Conjunctivae and EOM are normal. Pupils are equal, round, and reactive to light. Right eye exhibits no discharge. Left eye exhibits no discharge. No scleral icterus.  Neck: Normal range of motion. Neck supple. No JVD present. No tracheal deviation present. No thyromegaly present.  Cardiovascular: Normal rate, regular rhythm, normal heart sounds and intact distal pulses.   No murmur heard. Respiratory: Effort normal and breath sounds normal. No  respiratory distress. She has no wheezes. She has no rales. She exhibits no tenderness.  GI: Soft. Bowel sounds are normal. She exhibits no distension and no mass. There is tenderness (some mild tenderness RUQ).  There is no rebound and no guarding.  Musculoskeletal: She exhibits no edema or tenderness.  Lymphadenopathy:    She has no cervical adenopathy.  Neurological: She is alert and oriented to person, place, and time. No cranial nerve deficit.  Skin: Skin is warm and dry. No rash noted. She is not diaphoretic. No erythema. No pallor.  Psychiatric: She has a normal mood and affect. Her behavior is normal. Judgment and thought content normal.    Assessment/Plan: 1.  Hyponatremia 2.  Cholelithiasis with some possible biliary colic 3.  Hypertension. 4.  Hx of tobacco use  Plan:  Currently she is not symptomatic, work up is unremarkable right now.  She is being admitted for hyponatremia, and I will discuss with Dr. Marcello Moores.  Once her hyponatremia is better it may be easier to do her GB while she is here.  Will look at labs, workup and schedule to make final decision.  Mattson Dayal 03/02/2014, 1:38 PM

## 2014-03-05 ENCOUNTER — Encounter (HOSPITAL_COMMUNITY): Payer: Self-pay | Admitting: General Surgery

## 2014-03-05 LAB — BASIC METABOLIC PANEL
Anion gap: 9 (ref 5–15)
Anion gap: 7 (ref 5–15)
BUN: 8 mg/dL (ref 6–23)
BUN: 8 mg/dL (ref 6–23)
CO2: 24 mmol/L (ref 19–32)
CO2: 26 mmol/L (ref 19–32)
Calcium: 8.5 mg/dL (ref 8.4–10.5)
Calcium: 8.7 mg/dL (ref 8.4–10.5)
Chloride: 97 mEq/L (ref 96–112)
Chloride: 97 mEq/L (ref 96–112)
Creatinine, Ser: 1.01 mg/dL (ref 0.50–1.10)
Creatinine, Ser: 1.06 mg/dL (ref 0.50–1.10)
GFR calc Af Amer: 63 mL/min — ABNORMAL LOW (ref >90)
GFR calc Af Amer: 59 mL/min — ABNORMAL LOW (ref >90)
GFR calc non Af Amer: 54 mL/min — ABNORMAL LOW (ref >90)
GFR calc non Af Amer: 51 mL/min — ABNORMAL LOW (ref >90)
Glucose, Bld: 92 mg/dL (ref 70–99)
Glucose, Bld: 163 mg/dL — ABNORMAL HIGH (ref 70–99)
Potassium: 3.7 mmol/L (ref 3.5–5.1)
Potassium: 4 mmol/L (ref 3.5–5.1)
Sodium: 130 mmol/L — ABNORMAL LOW (ref 135–145)
Sodium: 129 mmol/L — ABNORMAL LOW (ref 135–145)

## 2014-03-05 MED ORDER — OXYCODONE HCL 5 MG PO TABS: 5.0000 mg | ORAL_TABLET | ORAL | Status: DC | PRN

## 2014-03-05 MED ORDER — OXYCODONE HCL 5 MG PO TABS: 5.0000 mg | ORAL_TABLET | Freq: Four times a day (QID) | ORAL | Status: DC | PRN

## 2014-03-05 NOTE — Discharge Instructions (Addendum)
LAPAROSCOPIC SURGERY: POST OP INSTRUCTIONS ° °1. DIET: Follow a light bland diet the first 24 hours after arrival home, such as soup, liquids, crackers, etc.  Be sure to include lots of fluids daily.  Avoid fast food or heavy meals as your are more likely to get nauseated.  Eat a low fat the next few days after surgery.   °2. Take your usually prescribed home medications unless otherwise directed. °3. PAIN CONTROL: °a. Pain is best controlled by a usual combination of three different methods TOGETHER: °i. Ice/Heat °ii. Over the counter pain medication °iii. Prescription pain medication °b. Most patients will experience some swelling and bruising around the incisions.  Ice packs or heating pads (30-60 minutes up to 6 times a day) will help. Use ice for the first few days to help decrease swelling and bruising, then switch to heat to help relax tight/sore spots and speed recovery.  Some people prefer to use ice alone, heat alone, alternating between ice & heat.  Experiment to what works for you.  Swelling and bruising can take several weeks to resolve.   °c. It is helpful to take an over-the-counter pain medication regularly for the first few weeks.  Choose one of the following that works best for you: °i. Naproxen (Aleve, etc)  Two 220mg tabs twice a day °ii. Ibuprofen (Advil, etc) Three 200mg tabs four times a day (every meal & bedtime) °d. A  prescription for pain medication (such as percocet, vicodin, oxycodone, hydrocodone, etc) should be given to you upon discharge.  Take your pain medication as prescribed.  °i. If you are having problems/concerns with the prescription medicine (does not control pain, nausea, vomiting, rash, itching, etc), please call us [phone] to see if we need to switch you to a different pain medicine that will work better for you and/or control your side effect better. °ii. If you need a refill on your pain medication, please contact your pharmacy.  They will contact our office to  request authorization. Prescriptions will not be filled after 5 pm or on week-ends. ° ° °4. Avoid getting constipated.  Between the surgery and the pain medications, it is common to experience some constipation.  Increasing fluid intake and taking a fiber supplement (such as Metamucil, Citrucel, FiberCon, MiraLax, etc) 1-2 times a day regularly will usually help prevent this problem from occurring.  A mild laxative (prune juice, Milk of Magnesia, MiraLax, etc) should be taken according to package directions if there are no bowel movements after 48 hours.   °5. Watch out for diarrhea.  If you have many loose bowel movements, simplify your diet to bland foods & liquids for a few days.  Stop any stool softeners and decrease your fiber supplement.  Switching to mild anti-diarrheal medications (Kayopectate, Pepto Bismol) can help.  If this worsens or does not improve, please call us. °6. Wash / shower every day.  You may shower over the dressings as they are waterproof.  Continue to shower over incision(s) after the dressing is off. °7. Remove your waterproof bandages 5 days after surgery.  You may leave the incision open to air.  You may replace a dressing/Band-Aid to cover the incision for comfort if you wish.  °8. ACTIVITIES as tolerated:   °a. You may resume regular (light) daily activities beginning the next day--such as daily self-care, walking, climbing stairs--gradually increasing activities as tolerated.  If you can walk 30 minutes without difficulty, it is safe to try more intense activity such as   jogging, treadmill, bicycling, low-impact aerobics, swimming, etc. b. Save the most intensive and strenuous activity for last such as sit-ups, heavy lifting, contact sports, etc  Refrain from any heavy lifting or straining until you are off narcotics for pain control.   c. DO NOT PUSH THROUGH PAIN.  Let pain be your guide: If it hurts to do something, don't do it.  Pain is your body warning you to avoid that  activity for another week until the pain goes down. d. You may drive when you are no longer taking prescription pain medication, you can comfortably wear a seatbelt, and you can safely maneuver your car and apply brakes. e. Bonita QuinYou may have sexual intercourse when it is comfortable.  9. FOLLOW UP in our office a. Please call [facility] at [phone] to set up an appointment to see your surgeon in the office for a follow-up appointment approximately 2-3 weeks after your surgery. b. Make sure that you call for this appointment the day you arrive home to insure a convenient appointment time. 10. IF YOU HAVE DISABILITY OR FAMILY LEAVE FORMS, BRING THEM TO THE OFFICE FOR PROCESSING.  DO NOT GIVE THEM TO YOUR DOCTOR.   WHEN TO CALL US [phone]: 1. Poor pain control 2. Reactions / problems with new medications (rash/itching, nausea, etc)  3. Fever over 101.5 F (38.5 C) 4. Inability to urinate 5. Nausea and/or vomiting 6. Worsening swelling or bruising 7. Continued bleeding from incision. 8. Increased pain, redness, or drainage from the incision   The clinic staff is available to answer your questions during regular business hours (8:30am-5pm).  Please dont hesitate to call and ask to speak to one of our nurses for clinical concerns.   If you have a medical emergency, go to the nearest emergency room or call 911.  A [facility] from [facility] is always on call at the [facility]   [facility] [place] [address], [address], [place], [place]  [id number] ? MAIN: [phone] ? TOLL FREE: [phone] ?  FAX [phone] [email or web address]  Laparoscopic Cholecystectomy, Care After Refer to this sheet in the next few weeks. These instructions provide you with information on caring for yourself after your procedure. Your health care provider may also give you more specific instructions. Your treatment has been planned according to current medical practices,  but problems sometimes occur. Call your health care provider if you have any problems or questions after your procedure. WHAT TO EXPECT AFTER THE PROCEDURE After your procedure, it is typical to have the following:  Pain at your incision sites. You will be given pain medicines to control the pain.  Mild nausea or vomiting. This should improve after the first 24 hours.  Bloating and possibly shoulder pain from the gas used during the procedure. This will improve after the first 24 hours. HOME CARE INSTRUCTIONS   Change bandages (dressings) as directed by your health care provider.  Keep the wound dry and clean. You may wash the wound gently with soap and water. Gently blot or dab the area dry.  Do not take baths or use swimming pools or hot tubs for 2 weeks or until your health care provider approves.  Only take over-the-counter or prescription medicines as directed by your health care provider.  Continue your normal diet as directed by your health care provider.  Do not lift anything heavier than 10 pounds (4.5 kg) until your health care provider approves.  Do not play contact sports for  1 week or until your health care provider approves. SEEK MEDICAL CARE IF:   You have redness, swelling, or increasing pain in the wound.  You notice yellowish-white fluid (pus) coming from the wound.  You have drainage from the wound that lasts longer than 1 day.  You notice a bad smell coming from the wound or dressing.  Your surgical cuts (incisions) break open. SEEK IMMEDIATE MEDICAL CARE IF:   You develop a rash.  You have difficulty breathing.  You have chest pain.  You have a fever.  You have increasing pain in the shoulders (shoulder strap areas).  You have dizzy episodes or faint while standing.  You have severe abdominal pain.  You feel sick to your stomach (nauseous) or throw up (vomit) and this lasts for more than 1 day. Document Released: 01/30/2005 Document Revised:  11/20/2012 Document Reviewed: 09/11/2012 [facility] Patient Information 2015 [employer] [place] [place]. This information is not intended to replace advice given to you by your health care provider. Make sure you discuss any questions you have with your health care provider.

## 2014-03-05 NOTE — Care Management Note (Signed)
CARE MANAGEMENT NOTE 03/05/2014  Patient:  Joanne White,Joanne White   Account Number:  [id number]  Date Initiated:  03/05/2014  Documentation initiated by:  Sandford CrazeLEMENTS,Janesa Dockery  Subjective/Objective Assessment:   74 yo admitted with hyponatremia     Action/Plan:   From home alone   Anticipated DC Date:  03/05/2014   Anticipated DC Plan:  HOME/SELF CARE      DC Planning Services  CM consult      Choice offered to / List presented to:             Status of service:  Completed, signed off Medicare Important Message given?  YES (If response is "NO", the following Medicare IM given date fields will be blank) Date Medicare IM given:  03/05/2014 Medicare IM given by:  Sandford CrazeLEMENTS,Tymarion Everard Date Additional Medicare IM given:   Additional Medicare IM given by:    Discharge Disposition:    Per UR Regulation:  Reviewed for med. necessity/level of care/duration of stay  If discussed at Long Length of Stay Meetings, dates discussed:    Comments:  Sandford Crazeora Lovis More RN,BSN,NCM Chart reviewed.  No CM needs noted.

## 2014-03-05 NOTE — Discharge Summary (Signed)
Physician Discharge Summary  Joanne White AVW:[id number] DOB: 04-08-1940 DOA: 03/02/2014  PCP: Catalina Pizza, MD  Admit date: 03/02/2014 Discharge date: 03/05/2014  Time spent: 30 minutes  Recommendations for Outpatient Follow-up:  1. Follow up with Dr Clovis Pu as OP for routine wound care Eat ? salt for 2-3 days Soft diet  Discharge Diagnoses:  Principal Problem:   Hyponatremia Active Problems:   Colicky RUQ abdominal pain   Cholelithiasis   HTN (hypertension), benign   Discharge Condition: good  Diet recommendation: soft regular diet for 1 week  Filed Weights   03/04/14 1624  Weight: 61.689 kg (136 lb)    History of present illness:   74 y/o ? 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 sodium/potassium.  Her electrolyte anomalies were corrected and her HCTZ was d/c. She underwent 03/04/14 Lap chole and did great after Sodium slightly low on d/c but advised regular salt diet and labs 1 week and close f/u gen suregry as OP  Consultations:  Gen surgery  Discharge Exam: Filed Vitals:   03/05/14 0300  BP: 129/70  Pulse: 63  Temp: 98.1 F (36.7 C)  Resp: 19    General: eomi, ncat tol diet well Cardiovascular: s1 s 2no m.r.g Respiratory: clear  Discharge Instructions   Discharge Instructions    Diet general    Complete by:  As directed      Discharge instructions    Complete by:  As directed   Stop HCTZ Eat more salt for the next 2-3 days-warm soups Please get labwork re-checked as an OP-Either with your primary MD, or with your surgeon     Increase activity slowly    Complete by:  As directed           Current Discharge Medication List    START taking these medications   Details  oxyCODONE (OXY IR/ROXICODONE) 5 MG immediate release tablet Take 1 tablet (5 mg total) by mouth every 4 (four) hours as needed for moderate pain. Qty: 30 tablet, Refills: 0      CONTINUE these  medications which have NOT CHANGED   Details  aspirin EC 81 MG tablet Take 81 mg by mouth daily.    lisinopril (PRINIVIL,ZESTRIL) 40 MG tablet Take 40 mg by mouth daily.    Omega-3 Fatty Acids (FISH OIL) 1000 MG CAPS Take 1,000 mg by mouth 2 (two) times daily.      STOP taking these medications     hydrochlorothiazide (HYDRODIURIL) 25 MG tablet        Allergies  Allergen Reactions  . Codeine Nausea And Vomiting   Follow-up Information    Follow up with Vanita Panda., MD. Schedule an appointment as soon as possible for a visit on 03/24/2014.   Specialty:  General Surgery   Contact information:   [address] [place] [address] [phone]        The results of significant diagnostics from this hospitalization (including imaging, microbiology, ancillary and laboratory) are listed below for reference.    Significant Diagnostic Studies: US Abdomen Complete  02/24/2014   CLINICAL DATA:  Right upper quadrant pain off and on for 2 days with nausea.  EXAM: ULTRASOUND ABDOMEN COMPLETE  COMPARISON:  None.  FINDINGS: Gallbladder: 2 small mobile gallstones are noted within the gallbladder, the largest measuring 11 mm. No wall thickening. Negative sonographic Murphy's.  Common bile duct: Diameter: Normal  caliber, 6 mm.  Liver: No focal lesion identified. Within normal limits in parenchymal echogenicity.  IVC: No abnormality visualized.  Pancreas: Visualized portion unremarkable.  Spleen: Size and appearance within normal limits.  Right Kidney: Length: 10.4 cm. Echogenicity within normal limits. No mass or hydronephrosis visualized.  Left Kidney: Length: 10.1 cm. Echogenicity within normal limits. No mass or hydronephrosis visualized.  Abdominal aorta: No aneurysm visualized.  Other findings: None.  IMPRESSION: Cholelithiasis.  No sonographic evidence of acute cholecystitis.   Electronically Signed   By: Charlett NoseKevin  Dover M.D.   On: 02/24/2014 11:57    Microbiology: Recent  Results (from the past 240 hour(s))  Surgical pcr screen     Status: None   Collection Time: 03/03/14  5:50 PM  Result Value Ref Range Status   MRSA, PCR NEGATIVE NEGATIVE Final   Staphylococcus aureus NEGATIVE NEGATIVE Final    Comment:        The Xpert SA Assay (FDA approved for NASAL specimens in patients over 321 years of age), is one component of a comprehensive surveillance program.  Test performance has been validated by [facility] for patients greater than or equal to 73 year old. It is not intended to diagnose infection nor to guide or monitor treatment.      Labs: Basic Metabolic Panel:  Recent Labs Lab 03/03/14 0705 03/03/14 1149 03/04/14 0542 03/05/14 0428 03/05/14 1010  NA 132* 132* 134* 123* 129*  K 4.5 3.4* 4.1 3.7 4.0  CL 97 97 103 95* 97  CO2 27 27 24 24 26   GLUCOSE 89 164* 93 92 163*  BUN 16 16 14 8 8   CREATININE 1.21* 1.15* 1.07 1.01 1.06  CALCIUM 9.2 9.0 8.7 8.1* 8.7   Liver Function Tests:  Recent Labs Lab 03/02/14 1125 03/04/14 0542  AST 18 14  ALT 14 11  ALKPHOS 52 40  BILITOT 0.7 0.7  PROT 7.1 5.5*  ALBUMIN 4.4 3.4*    Recent Labs Lab 03/02/14 1125  LIPASE 29   No results for input(s): AMMONIA in the last 168 hours. CBC:  Recent Labs Lab 03/02/14 1125 03/03/14 0705 03/04/14 0542  WBC 5.7 4.1 4.6  NEUTROABS 3.8  --   --   HGB 13.6 12.9 11.8*  HCT 37.7 35.7* 33.2*  MCV 94.7 96.2 97.4  PLT 307 262 245   Cardiac Enzymes: No results for input(s): CKTOTAL, CKMB, CKMBINDEX, TROPONINI in the last 168 hours. BNP: BNP (last 3 results) No results for input(s): PROBNP in the last 8760 hours. CBG: No results for input(s): GLUCAP in the last 168 hours.     SignedRhetta Mura:  Joanne White, Joanne White  [facility] 03/05/2014, 1:05 PM

## 2014-03-05 NOTE — Progress Notes (Signed)
1 Day Post-Op Lap chole Subjective: She feels better, having a bit of nausea with pain meds but better now.  Tolerating a diet.    Objective: Vital signs in last 24 hours: Temp:  [97.6 F (36.4 C)-98.7 F (37.1 C)] 98.1 F (36.7 C) (01/21 0300) Pulse Rate:  [60-80] 63 (01/21 0300) Resp:  [14-19] 19 (01/21 0300) BP: (113-176)/(54-73) 129/70 mmHg (01/21 0300) SpO2:  [97 %-100 %] 97 % (01/21 0300) Weight:  [136 lb (61.689 kg)] 136 lb (61.689 kg) (01/20 1624) Last BM Date: 03/03/14  Intake/Output from previous day: 01/20 0701 - 01/21 0700 In: 1090 [P.O.:240; I.V.:850] Out: 810 [Urine:800; Blood:10] Intake/Output this shift:    General appearance: alert, cooperative and no distress GI: soft, non-tender; bowel sounds normal; no masses,  no organomegaly Incisions: clean, dry, intact  Lab Results:   Recent Labs  03/03/14 0705 03/04/14 0542  WBC 4.1 4.6  HGB 12.9 11.8*  HCT 35.7* 33.2*  PLT 262 245    BMET  Recent Labs  03/04/14 0542 03/05/14 0428  NA 134* 123*  K 4.1 3.7  CL 103 95*  CO2 24 24  GLUCOSE 93 92  BUN 14 8  CREATININE 1.07 1.01  CALCIUM 8.7 8.1*   PT/INR  Recent Labs  03/04/14 0542  LABPROT 13.9  INR 1.06     Recent Labs Lab 03/02/14 1125 03/04/14 0542  AST 18 14  ALT 14 11  ALKPHOS 52 40  BILITOT 0.7 0.7  PROT 7.1 5.5*  ALBUMIN 4.4 3.4*     Lipase     Component Value Date/Time   LIPASE 29 03/02/2014 1125     Studies/Results: No results found.  Medications: . aspirin EC  81 mg Oral Daily  . heparin  5,000 Units Subcutaneous 3 times per day  . lisinopril  40 mg Oral Daily    Assessment/Plan 1. Hyponatremia 2. Cholelithiasis with some possible biliary colic 3. Hypertension. 4. Hx of tobacco use  5. Mild renal insuffiencey  Plan:  Ok to d/c from surgical standpoint, but Na 123: Dr Mahala MenghiniSamtani has ordered a recheck.  F/u with me in the office.   LOS: 3 days    Daeron Carreno C. 03/05/2014

## 2014-03-09 DIAGNOSIS — E871 Hypo-osmolality and hyponatremia: Secondary | ICD-10-CM | POA: Diagnosis not present

## 2014-03-09 DIAGNOSIS — R0602 Shortness of breath: Secondary | ICD-10-CM | POA: Diagnosis not present

## 2014-03-09 DIAGNOSIS — M25519 Pain in unspecified shoulder: Secondary | ICD-10-CM | POA: Diagnosis not present

## 2014-03-11 DIAGNOSIS — E871 Hypo-osmolality and hyponatremia: Secondary | ICD-10-CM | POA: Diagnosis not present

## 2014-03-11 DIAGNOSIS — M25519 Pain in unspecified shoulder: Secondary | ICD-10-CM | POA: Diagnosis not present

## 2014-03-11 DIAGNOSIS — R4922 Hyponasality: Secondary | ICD-10-CM | POA: Diagnosis not present

## 2014-03-11 DIAGNOSIS — R0602 Shortness of breath: Secondary | ICD-10-CM | POA: Diagnosis not present

## 2014-04-06 DIAGNOSIS — F17200 Nicotine dependence, unspecified, uncomplicated: Secondary | ICD-10-CM | POA: Diagnosis not present

## 2014-04-06 DIAGNOSIS — J069 Acute upper respiratory infection, unspecified: Secondary | ICD-10-CM | POA: Diagnosis not present

## 2014-04-06 DIAGNOSIS — E782 Mixed hyperlipidemia: Secondary | ICD-10-CM | POA: Diagnosis not present

## 2014-04-06 DIAGNOSIS — R05 Cough: Secondary | ICD-10-CM | POA: Diagnosis not present

## 2014-04-06 DIAGNOSIS — E871 Hypo-osmolality and hyponatremia: Secondary | ICD-10-CM | POA: Diagnosis not present

## 2014-04-06 DIAGNOSIS — R5383 Other fatigue: Secondary | ICD-10-CM | POA: Diagnosis not present

## 2014-04-06 DIAGNOSIS — R4922 Hyponasality: Secondary | ICD-10-CM | POA: Diagnosis not present

## 2014-04-10 DIAGNOSIS — F17200 Nicotine dependence, unspecified, uncomplicated: Secondary | ICD-10-CM | POA: Diagnosis not present

## 2014-04-10 DIAGNOSIS — E46 Unspecified protein-calorie malnutrition: Secondary | ICD-10-CM | POA: Diagnosis not present

## 2014-04-10 DIAGNOSIS — J069 Acute upper respiratory infection, unspecified: Secondary | ICD-10-CM | POA: Diagnosis not present

## 2014-04-10 DIAGNOSIS — Z6824 Body mass index (BMI) 24.0-24.9, adult: Secondary | ICD-10-CM | POA: Diagnosis not present

## 2014-04-10 DIAGNOSIS — E871 Hypo-osmolality and hyponatremia: Secondary | ICD-10-CM | POA: Diagnosis not present

## 2014-04-10 DIAGNOSIS — E782 Mixed hyperlipidemia: Secondary | ICD-10-CM | POA: Diagnosis not present

## 2014-05-05 DIAGNOSIS — E871 Hypo-osmolality and hyponatremia: Secondary | ICD-10-CM | POA: Diagnosis not present

## 2014-05-05 DIAGNOSIS — E782 Mixed hyperlipidemia: Secondary | ICD-10-CM | POA: Diagnosis not present

## 2014-05-05 DIAGNOSIS — Z6823 Body mass index (BMI) 23.0-23.9, adult: Secondary | ICD-10-CM | POA: Diagnosis not present

## 2014-05-05 DIAGNOSIS — I10 Essential (primary) hypertension: Secondary | ICD-10-CM | POA: Diagnosis not present

## 2014-06-25 DIAGNOSIS — E871 Hypo-osmolality and hyponatremia: Secondary | ICD-10-CM | POA: Diagnosis not present

## 2014-06-25 DIAGNOSIS — D509 Iron deficiency anemia, unspecified: Secondary | ICD-10-CM | POA: Diagnosis not present

## 2014-07-03 DIAGNOSIS — R944 Abnormal results of kidney function studies: Secondary | ICD-10-CM | POA: Diagnosis not present

## 2014-07-03 DIAGNOSIS — E871 Hypo-osmolality and hyponatremia: Secondary | ICD-10-CM | POA: Diagnosis not present

## 2014-07-03 DIAGNOSIS — D649 Anemia, unspecified: Secondary | ICD-10-CM | POA: Diagnosis not present

## 2014-08-05 ENCOUNTER — Emergency Department (HOSPITAL_COMMUNITY)
Admission: EM | Admit: 2014-08-05 | Discharge: 2014-08-05 | Disposition: A | Discharge status: Home / Self Care | Payer: Medicare Other | Attending: Emergency Medicine | Admitting: Emergency Medicine

## 2014-08-05 ENCOUNTER — Encounter (HOSPITAL_COMMUNITY): Payer: Self-pay | Admitting: Emergency Medicine

## 2014-08-05 DIAGNOSIS — I10 Essential (primary) hypertension: Secondary | ICD-10-CM | POA: Diagnosis not present

## 2014-08-05 DIAGNOSIS — H9319 Tinnitus, unspecified ear: Secondary | ICD-10-CM | POA: Diagnosis not present

## 2014-08-05 DIAGNOSIS — Z87891 Personal history of nicotine dependence: Secondary | ICD-10-CM | POA: Diagnosis not present

## 2014-08-05 DIAGNOSIS — Z79899 Other long term (current) drug therapy: Secondary | ICD-10-CM | POA: Diagnosis not present

## 2014-08-05 DIAGNOSIS — R0989 Other specified symptoms and signs involving the circulatory and respiratory systems: Secondary | ICD-10-CM | POA: Diagnosis not present

## 2014-08-05 DIAGNOSIS — Z7982 Long term (current) use of aspirin: Secondary | ICD-10-CM | POA: Diagnosis not present

## 2014-08-05 DIAGNOSIS — R42 Dizziness and giddiness: Secondary | ICD-10-CM | POA: Diagnosis not present

## 2014-08-05 LAB — URINALYSIS, ROUTINE W REFLEX MICROSCOPIC
Bilirubin Urine: NEGATIVE
Glucose, UA: NEGATIVE mg/dL
Hgb urine dipstick: NEGATIVE
Ketones, ur: NEGATIVE mg/dL
Nitrite: NEGATIVE
Protein, ur: NEGATIVE mg/dL
Specific Gravity, Urine: 1.015 (ref 1.005–1.030)
Urobilinogen, UA: 0.2 mg/dL (ref 0.0–1.0)
pH: 6 (ref 5.0–8.0)

## 2014-08-05 LAB — COMPREHENSIVE METABOLIC PANEL
ALT: 16 U/L (ref 14–54)
AST: 21 U/L (ref 15–41)
Albumin: 4 g/dL (ref 3.5–5.0)
Alkaline Phosphatase: 85 U/L (ref 38–126)
Anion gap: 8 (ref 5–15)
BUN: 15 mg/dL (ref 6–20)
CO2: 27 mmol/L (ref 22–32)
Calcium: 9.1 mg/dL (ref 8.9–10.3)
Chloride: 103 mmol/L (ref 101–111)
Creatinine, Ser: 1.07 mg/dL — ABNORMAL HIGH (ref 0.44–1.00)
GFR calc Af Amer: 58 mL/min — ABNORMAL LOW (ref >60)
GFR calc non Af Amer: 50 mL/min — ABNORMAL LOW (ref >60)
Glucose, Bld: 106 mg/dL — ABNORMAL HIGH (ref 65–99)
Potassium: 4.2 mmol/L (ref 3.5–5.1)
Sodium: 138 mmol/L (ref 135–145)
Total Bilirubin: 0.4 mg/dL (ref 0.3–1.2)
Total Protein: 7.4 g/dL (ref 6.5–8.1)

## 2014-08-05 LAB — CBC WITH DIFFERENTIAL/PLATELET
Basophils Absolute: 0 10*3/uL (ref 0.0–0.1)
Basophils Relative: 1 % (ref 0–1)
Eosinophils Absolute: 0.2 10*3/uL (ref 0.0–0.7)
Eosinophils Relative: 2 % (ref 0–5)
HCT: 41.7 % (ref 36.0–46.0)
Hemoglobin: 14.3 g/dL (ref 12.0–15.0)
Lymphocytes Relative: 31 % (ref 12–46)
Lymphs Abs: 2.5 10*3/uL (ref 0.7–4.0)
MCH: 32.9 pg (ref 26.0–34.0)
MCHC: 34.3 g/dL (ref 30.0–36.0)
MCV: 95.9 fL (ref 78.0–100.0)
Monocytes Absolute: 0.6 10*3/uL (ref 0.1–1.0)
Monocytes Relative: 8 % (ref 3–12)
Neutro Abs: 4.6 10*3/uL (ref 1.7–7.7)
Neutrophils Relative %: 58 % (ref 43–77)
Platelets: 285 10*3/uL (ref 150–400)
RBC: 4.35 MIL/uL (ref 3.87–5.11)
RDW: 13.9 % (ref 11.5–15.5)
WBC: 7.9 10*3/uL (ref 4.0–10.5)

## 2014-08-05 LAB — TROPONIN I: Troponin I: <0.03 ng/mL (ref <0.031)

## 2014-08-05 LAB — URINE MICROSCOPIC-ADD ON

## 2014-08-05 MED ORDER — MECLIZINE HCL 12.5 MG PO TABS: 12.5000 mg | ORAL_TABLET | Freq: Three times a day (TID) | ORAL | Status: DC | PRN

## 2014-08-05 NOTE — Discharge Instructions (Signed)
Dizziness Keep a record of your blood pressure and follow up with Dr. Margo Aye. Return to the ED if you develop new or worsening symptoms. Dizziness is a common problem. It is a feeling of unsteadiness or light-headedness. You may feel like you are about to faint. Dizziness can lead to injury if you stumble or fall. A person of any age group can suffer from dizziness, but dizziness is more common in older adults. CAUSES  Dizziness can be caused by many different things, including:  Middle ear problems.  Standing for too long.  Infections.  An allergic reaction.  Aging.  An emotional response to something, such as the sight of blood.  Side effects of medicines.  Tiredness.  Problems with circulation or blood pressure.  Excessive use of alcohol or medicines, or illegal drug use.  Breathing too fast (hyperventilation).  An irregular heart rhythm (arrhythmia).  A low red blood cell count (anemia).  Pregnancy.  Vomiting, diarrhea, fever, or other illnesses that cause body fluid loss (dehydration).  Diseases or conditions such as Parkinson's disease, high blood pressure (hypertension), diabetes, and thyroid problems.  Exposure to extreme heat. DIAGNOSIS  Your health care provider will ask about your symptoms, perform a physical exam, and perform an electrocardiogram (ECG) to record the electrical activity of your heart. Your health care provider may also perform other heart or blood tests to determine the cause of your dizziness. These may include:  Transthoracic echocardiogram (TTE). During echocardiography, sound waves are used to evaluate how blood flows through your heart.  Transesophageal echocardiogram (TEE).  Cardiac monitoring. This allows your health care provider to monitor your heart rate and rhythm in real time.  Holter monitor. This is a portable device that records your heartbeat and can help diagnose heart arrhythmias. It allows your health care provider to track  your heart activity for several days if needed.  Stress tests by exercise or by giving medicine that makes the heart beat faster. TREATMENT  Treatment of dizziness depends on the cause of your symptoms and can vary greatly. HOME CARE INSTRUCTIONS   Drink enough fluids to keep your urine clear or pale yellow. This is especially important in very hot weather. In older adults, it is also important in cold weather.  Take your medicine exactly as directed if your dizziness is caused by medicines. When taking blood pressure medicines, it is especially important to get up slowly.  Rise slowly from chairs and steady yourself until you feel okay.  In the morning, first sit up on the side of the bed. When you feel okay, stand slowly while holding onto something until you know your balance is fine.  Move your legs often if you need to stand in one place for a long time. Tighten and relax your muscles in your legs while standing.  Have someone stay with you for 1-2 days if dizziness continues to be a problem. Do this until you feel you are well enough to stay alone. Have the person call your health care provider if he or she notices changes in you that are concerning.  Do not drive or use heavy machinery if you feel dizzy.  Do not drink alcohol. SEEK IMMEDIATE MEDICAL CARE IF:   Your dizziness or light-headedness gets worse.  You feel nauseous or vomit.  You have problems talking, walking, or using your arms, hands, or legs.  You feel weak.  You are not thinking clearly or you have trouble forming sentences. It may take a friend or  family member to notice this.  You have chest pain, abdominal pain, shortness of breath, or sweating.  Your vision changes.  You notice any bleeding.  You have side effects from medicine that seems to be getting worse rather than better. MAKE SURE YOU:   Understand these instructions.  Will watch your condition.  Will get help right away if you are not  doing well or get worse. Document Released: 07/26/2000 Document Revised: 02/04/2013 Document Reviewed: 08/19/2010 [facility] Patient Information 2015 [place], [place]. This information is not intended to replace advice given to you by your health care provider. Make sure you discuss any questions you have with your health care provider.

## 2014-08-05 NOTE — ED Notes (Signed)
Pt given sprite to drink. 

## 2014-08-05 NOTE — ED Notes (Signed)
Pt given sprite 

## 2014-08-05 NOTE — ED Notes (Signed)
Pt c/o head congestion and states she turned her head while watching tv and got dizzy. Pt states the dizziness is better.

## 2014-08-05 NOTE — ED Provider Notes (Signed)
CSN: [id number]     Arrival date & time 08/05/14  1905 History   First MD Initiated Contact with Patient 08/05/14 1927     Chief Complaint  Patient presents with  . Dizziness     (Consider location/radiation/quality/duration/timing/severity/associated sxs/prior Treatment) HPI Comments:  Patient presents from home with 3 day history of nasal congestion and intermittent ringing in the ears. She states she was watching TV today when she turned her head all the sudden became dizzy. She denies vertigo but endorses lightheadedness. Symptoms resolved after taking meclizine tablet at home. She does have history of vertigo. No focal weakness, numbness or tingling. No headache. No vision change. No Difficulty speaking or swallowing. No chest pain or shortness of breath. States she's had a runny nose in some head congestion. She feels better now. She was concerned because her blood pressure was elevated 198/79. She states compliance with her blood pressure medications. Denies headache or visual change. Has had chronic tinnitus for months, unchanged today.  The history is provided by the patient and a relative.    Past Medical History  Diagnosis Date  . Vertigo   . Hypertension   . HTN (hypertension), benign 03/03/2014   Past Surgical History  Procedure Laterality Date  . No past surgeries    . Cholecystectomy  03/04/2014    Procedure: LAPAROSCOPIC CHOLECYSTECTOMY;  Surgeon: Romie Levee, MD;  Location: [facility];  Service: General;;   Family History  Problem Relation Age of Onset  . Hypertension Father   . Heart attack Brother   . Hypertension Brother    History  Substance Use Topics  . Smoking status: Former Smoker    Quit date: 03/03/2011  . Smokeless tobacco: Never Used  . Alcohol Use: Yes     Comment: 1 beer once or twice a week and a rare mixed drink   OB History    No data available     Review of Systems  Constitutional: Negative for fever, activity change and appetite change.   HENT: Positive for congestion and tinnitus. Negative for rhinorrhea.   Eyes: Negative for visual disturbance.  Respiratory: Negative for cough, chest tightness and shortness of breath.   Cardiovascular: Negative for chest pain.  Gastrointestinal: Negative for nausea, vomiting and abdominal pain.  Genitourinary: Negative for dysuria, hematuria, vaginal bleeding and vaginal discharge.  Musculoskeletal: Negative for myalgias and arthralgias.  Skin: Negative for wound.  Neurological: Positive for dizziness. Negative for weakness, numbness and headaches.  A complete 10 system review of systems was obtained and all systems are negative except as noted in the HPI and PMH.      Allergies  Codeine  Home Medications   Prior to Admission medications   Medication Sig Start Date End Date Taking? Authorizing Provider  aspirin EC 81 MG tablet Take 81 mg by mouth daily.   Yes Historical Provider, MD  Iron-FA-B Cmp-C-Biot-Probiotic (FUSION PLUS) CAPS Take 1 capsule by mouth daily.   Yes Historical Provider, MD  lisinopril (PRINIVIL,ZESTRIL) 40 MG tablet Take 40 mg by mouth daily. 02/09/14  Yes Historical Provider, MD  Omega-3 Fatty Acids (FISH OIL) 1000 MG CAPS Take 1,000 mg by mouth daily.    Yes Historical Provider, MD  meclizine (ANTIVERT) 12.5 MG tablet Take 1 tablet (12.5 mg total) by mouth 3 (three) times daily as needed for dizziness. 08/05/14   Glynn Octave, MD   BP 187/78 mmHg  Pulse 76  Temp(Src) 97.9 F (36.6 C) (Oral)  Resp 17  Ht 5\' 3"  (1.6  m)  Wt 136 lb (61.689 kg)  BMI 24.10 kg/m2  SpO2 97% Physical Exam  Constitutional: She is oriented to person, place, and time. She appears well-developed and well-nourished. No distress.  HENT:  Head: Normocephalic and atraumatic.  Mouth/Throat: Oropharynx is clear and moist. No oropharyngeal exudate.  Eyes: Conjunctivae and EOM are normal. Pupils are equal, round, and reactive to light.  Neck: Normal range of motion. Neck supple.  No  meningismus.  Cardiovascular: Normal rate, regular rhythm, normal heart sounds and intact distal pulses.   No murmur heard. Pulmonary/Chest: Effort normal and breath sounds normal. No respiratory distress.  Abdominal: Soft. There is no tenderness. There is no rebound and no guarding.  Musculoskeletal: Normal range of motion. She exhibits no edema or tenderness.  Neurological: She is alert and oriented to person, place, and time. No cranial nerve deficit. She exhibits normal muscle tone. Coordination normal.  No ataxia on finger to nose bilaterally. No pronator drift. 5/5 strength throughout. CN 2-12 intact. Negative Romberg. Equal grip strength. Sensation intact. Gait is normal.  No nystagmus.  Test of skew negative. Head impulse testing negative.  Skin: Skin is warm.  Psychiatric: She has a normal mood and affect. Her behavior is normal.  Nursing note and vitals reviewed.   ED Course  Procedures (including critical care time) Labs Review Labs Reviewed  COMPREHENSIVE METABOLIC PANEL - Abnormal; Notable for the following:    Glucose, Bld 106 (*)    Creatinine, Ser 1.07 (*)    GFR calc non Af Amer 50 (*)    GFR calc Af Amer 58 (*)    All other components within normal limits  URINALYSIS, ROUTINE W REFLEX MICROSCOPIC (NOT AT [facility]) - Abnormal; Notable for the following:    Leukocytes, UA SMALL (*)    All other components within normal limits  CBC WITH DIFFERENTIAL/PLATELET  TROPONIN I  URINE MICROSCOPIC-ADD ON    Imaging Review No results found.   EKG Interpretation   Date/Time:  Wednesday August 05 2014 20:01:19 EDT Ventricular Rate:  78 PR Interval:  130 QRS Duration: 93 QT Interval:  378 QTC Calculation: 430 R Axis:   43 Text Interpretation:  Sinus rhythm No significant change was found  Confirmed by Manus Gunning  MD, Yared Barefoot [phone]) on 08/05/2014 8:47:04 PM      MDM   Final diagnoses:  Dizziness  Essential hypertension   Episode of transient lightheadedness that  occurred after turning head.  Denies vertigo.  No focal weakness, chest pain, SOB. Hx vertigo, took meclizine at home which helped.   nonfocal neuro exam.  No ataxia.  Normal gait, no nystagmus.    Suspect peripheral vertigo.  She reports hx of ear problems and chronic tinnitus.   Has had nasal and ear pressure for the past several days.  EKG nsr. Labs at baseline. Orthostatics negative. Tolerating PO in the ED and ambulatory.  BP elevated.  Advised to keep log and address with PCP.  Treat as peripheral vertigo.  Doubt CVA.  Follow up with PCP. Return precautions discussed.   Glynn Octave, MD 08/05/14 2253

## 2014-08-06 DIAGNOSIS — I10 Essential (primary) hypertension: Secondary | ICD-10-CM | POA: Diagnosis not present

## 2014-08-06 DIAGNOSIS — H81399 Other peripheral vertigo, unspecified ear: Secondary | ICD-10-CM | POA: Diagnosis not present

## 2014-08-19 DIAGNOSIS — I10 Essential (primary) hypertension: Secondary | ICD-10-CM | POA: Diagnosis not present

## 2014-08-20 DIAGNOSIS — H81319 Aural vertigo, unspecified ear: Secondary | ICD-10-CM | POA: Diagnosis not present

## 2014-08-20 DIAGNOSIS — I10 Essential (primary) hypertension: Secondary | ICD-10-CM | POA: Diagnosis not present

## 2014-08-20 DIAGNOSIS — B029 Zoster without complications: Secondary | ICD-10-CM | POA: Diagnosis not present

## 2014-09-01 DIAGNOSIS — I10 Essential (primary) hypertension: Secondary | ICD-10-CM | POA: Diagnosis not present

## 2014-09-14 DIAGNOSIS — D649 Anemia, unspecified: Secondary | ICD-10-CM | POA: Diagnosis not present

## 2014-09-14 DIAGNOSIS — I10 Essential (primary) hypertension: Secondary | ICD-10-CM | POA: Diagnosis not present

## 2014-09-14 LAB — IRON,TIBC AND FERRITIN PANEL
%SAT: 20
Iron: 62
TIBC: 304
UIBC: 242

## 2014-10-01 ENCOUNTER — Ambulatory Visit (INDEPENDENT_AMBULATORY_CARE_PROVIDER_SITE_OTHER): Payer: Medicare Other | Admitting: Otolaryngology

## 2014-10-01 DIAGNOSIS — R42 Dizziness and giddiness: Secondary | ICD-10-CM | POA: Diagnosis not present

## 2014-10-01 DIAGNOSIS — H9311 Tinnitus, right ear: Secondary | ICD-10-CM

## 2014-10-01 DIAGNOSIS — H903 Sensorineural hearing loss, bilateral: Secondary | ICD-10-CM

## 2014-10-06 ENCOUNTER — Other Ambulatory Visit (INDEPENDENT_AMBULATORY_CARE_PROVIDER_SITE_OTHER): Payer: Self-pay | Admitting: Otolaryngology

## 2014-10-06 DIAGNOSIS — R42 Dizziness and giddiness: Secondary | ICD-10-CM

## 2014-10-15 ENCOUNTER — Ambulatory Visit (HOSPITAL_COMMUNITY): Payer: Medicare Other

## 2014-10-20 DIAGNOSIS — H9191 Unspecified hearing loss, right ear: Secondary | ICD-10-CM | POA: Diagnosis not present

## 2014-10-20 DIAGNOSIS — H9319 Tinnitus, unspecified ear: Secondary | ICD-10-CM | POA: Diagnosis not present

## 2014-10-20 DIAGNOSIS — R42 Dizziness and giddiness: Secondary | ICD-10-CM | POA: Diagnosis not present

## 2014-10-20 DIAGNOSIS — R2689 Other abnormalities of gait and mobility: Secondary | ICD-10-CM | POA: Diagnosis not present

## 2014-10-28 DIAGNOSIS — Z23 Encounter for immunization: Secondary | ICD-10-CM | POA: Diagnosis not present

## 2014-10-28 DIAGNOSIS — I10 Essential (primary) hypertension: Secondary | ICD-10-CM | POA: Diagnosis not present

## 2014-10-28 DIAGNOSIS — H9311 Tinnitus, right ear: Secondary | ICD-10-CM | POA: Diagnosis not present

## 2014-10-28 DIAGNOSIS — Z634 Disappearance and death of family member: Secondary | ICD-10-CM | POA: Diagnosis not present

## 2015-01-27 DIAGNOSIS — H9311 Tinnitus, right ear: Secondary | ICD-10-CM | POA: Diagnosis not present

## 2015-01-27 DIAGNOSIS — D649 Anemia, unspecified: Secondary | ICD-10-CM | POA: Diagnosis not present

## 2015-01-27 DIAGNOSIS — Z634 Disappearance and death of family member: Secondary | ICD-10-CM | POA: Diagnosis not present

## 2015-01-27 DIAGNOSIS — I10 Essential (primary) hypertension: Secondary | ICD-10-CM | POA: Diagnosis not present

## 2015-07-14 DIAGNOSIS — D649 Anemia, unspecified: Secondary | ICD-10-CM | POA: Diagnosis not present

## 2015-07-14 DIAGNOSIS — I10 Essential (primary) hypertension: Secondary | ICD-10-CM | POA: Diagnosis not present

## 2015-07-21 DIAGNOSIS — D649 Anemia, unspecified: Secondary | ICD-10-CM | POA: Diagnosis not present

## 2015-07-21 DIAGNOSIS — Z634 Disappearance and death of family member: Secondary | ICD-10-CM | POA: Diagnosis not present

## 2015-07-21 DIAGNOSIS — N182 Chronic kidney disease, stage 2 (mild): Secondary | ICD-10-CM | POA: Diagnosis not present

## 2015-07-21 DIAGNOSIS — I129 Hypertensive chronic kidney disease with stage 1 through stage 4 chronic kidney disease, or unspecified chronic kidney disease: Secondary | ICD-10-CM | POA: Diagnosis not present

## 2015-08-11 DIAGNOSIS — M26602 Left temporomandibular joint disorder, unspecified: Secondary | ICD-10-CM | POA: Diagnosis not present

## 2015-08-11 DIAGNOSIS — I10 Essential (primary) hypertension: Secondary | ICD-10-CM | POA: Diagnosis not present

## 2015-08-11 DIAGNOSIS — H9202 Otalgia, left ear: Secondary | ICD-10-CM | POA: Diagnosis not present

## 2015-09-17 IMAGING — US US ABDOMEN COMPLETE
1 series · 14 of 25 positions shown · non-contrast
Comparison: None.

CLINICAL DATA: Right upper quadrant pain off and on for 2 days with
nausea.

EXAM:
ULTRASOUND ABDOMEN COMPLETE

[Series 1: us abdomen complete · 0.18mm/px · 14 of 137 slices shown]
[im 1/137]
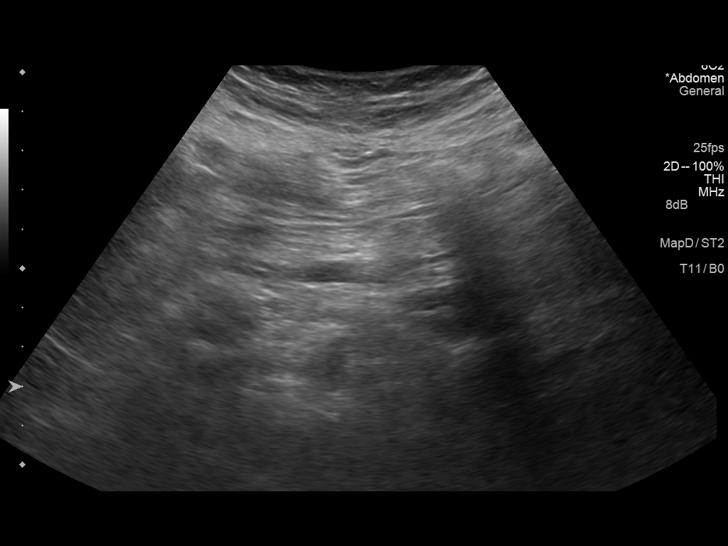
[im 12/137]
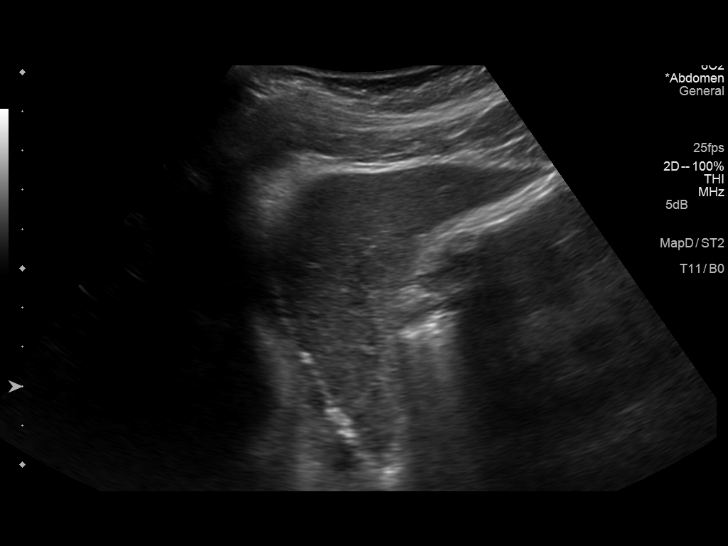
[im 23/137]
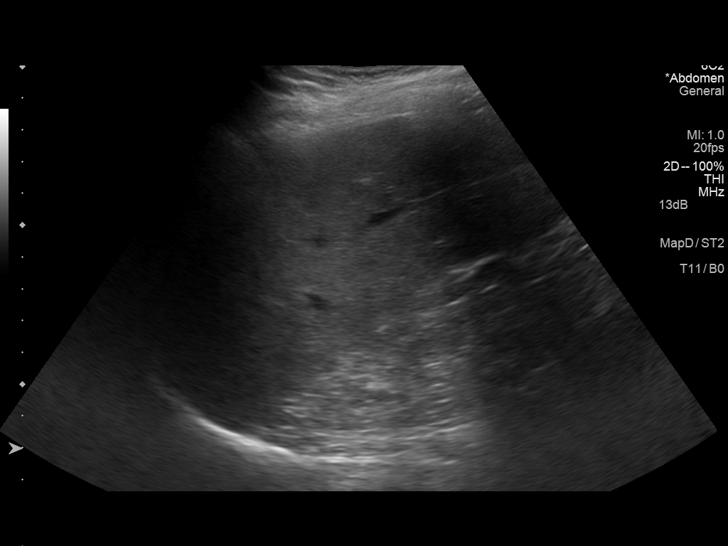
[im 35/137]
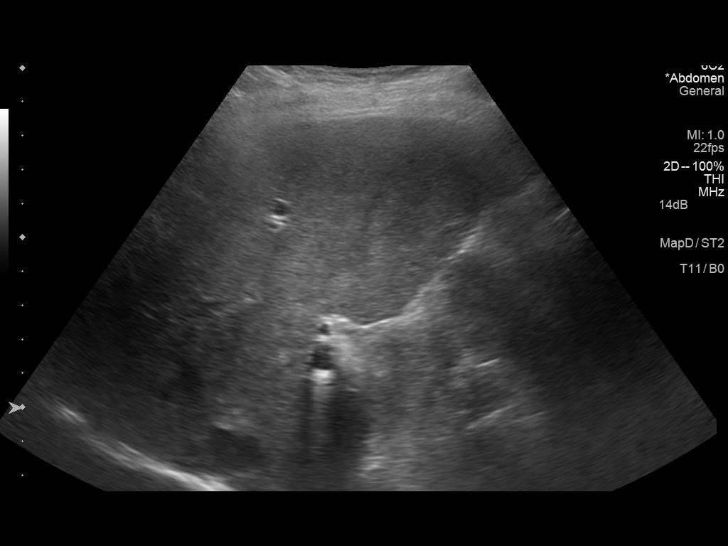
[im 46/137]
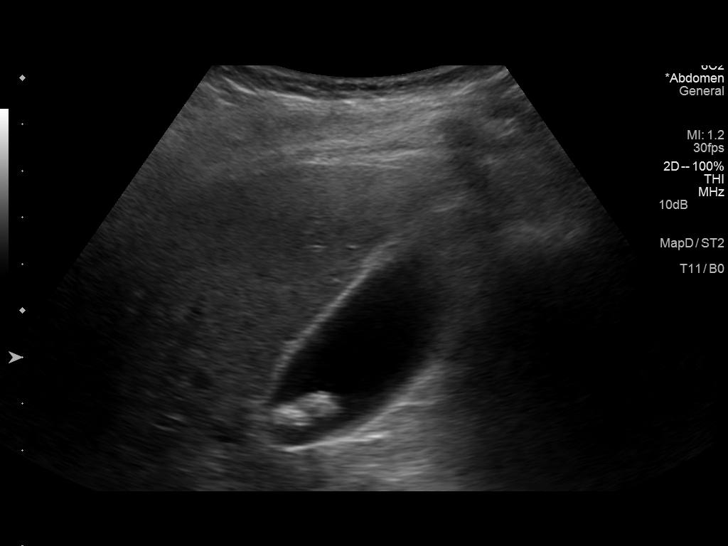
[im 52/137]
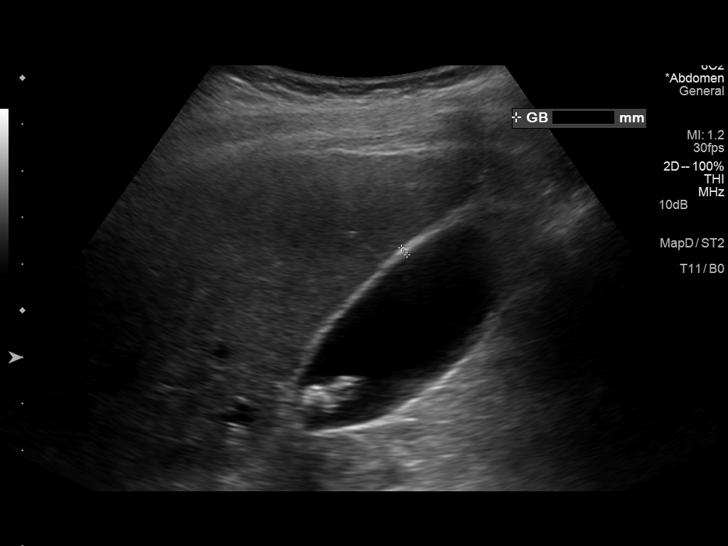
[im 63/137]
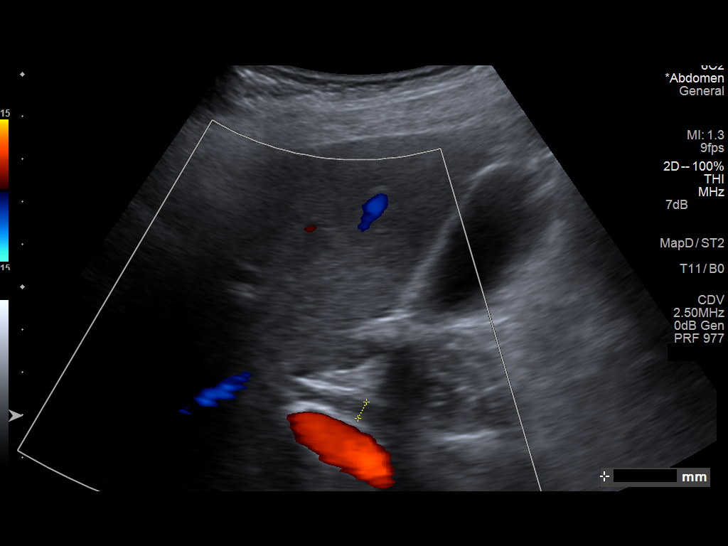
[im 74/137]
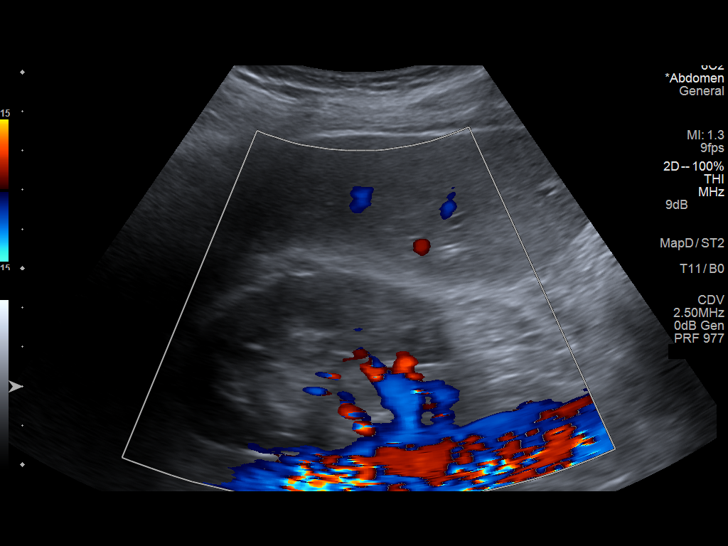
[im 86/137]
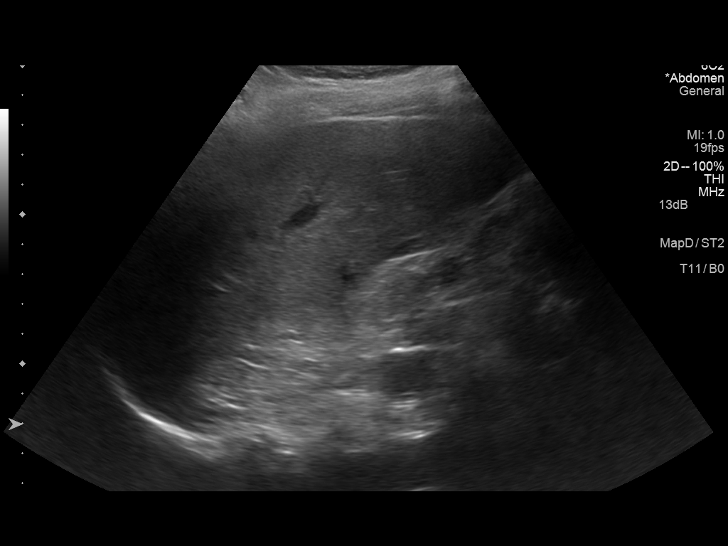
[im 91/137]
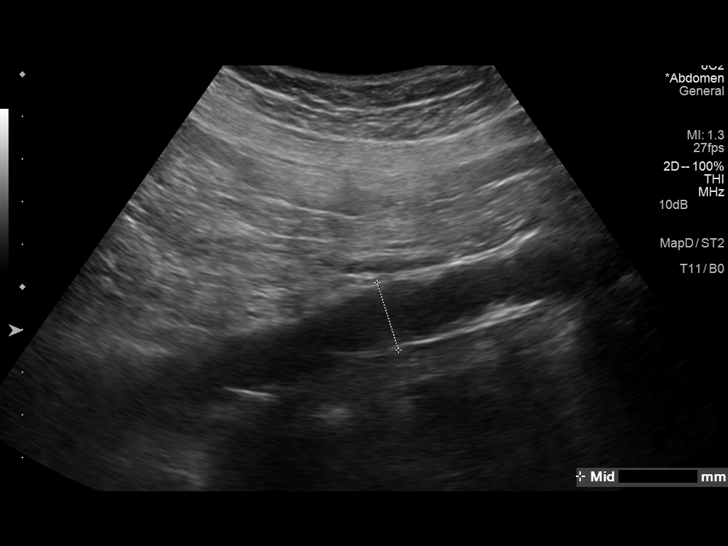
[im 103/137]
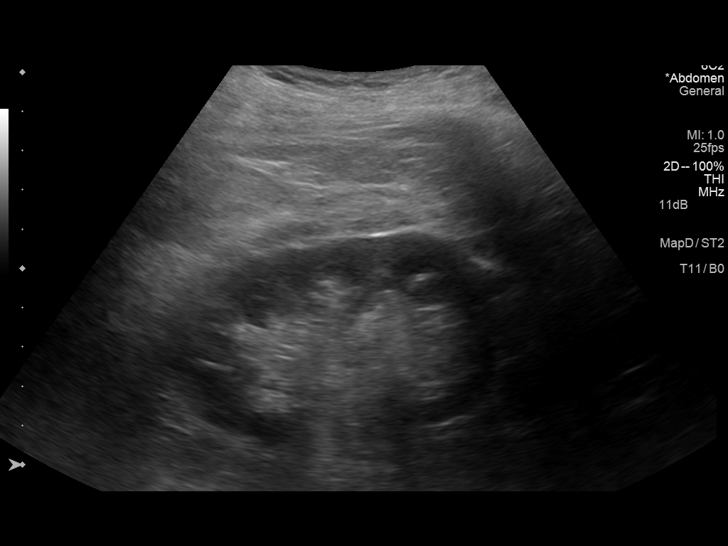
[im 114/137]
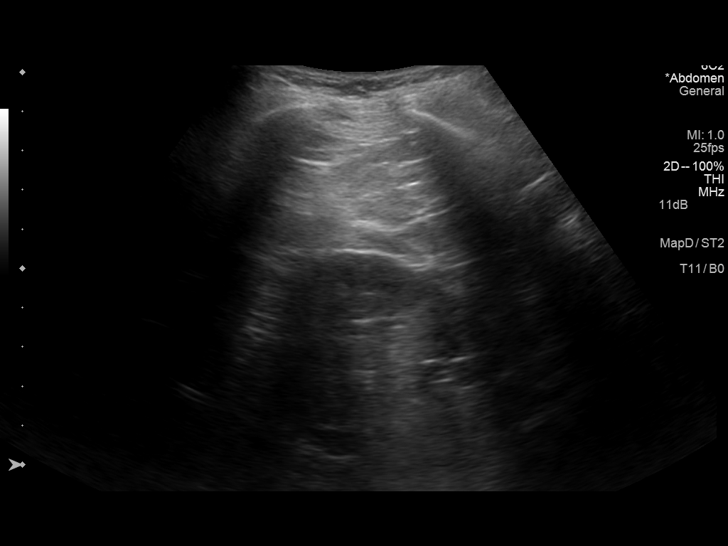
[im 125/137]
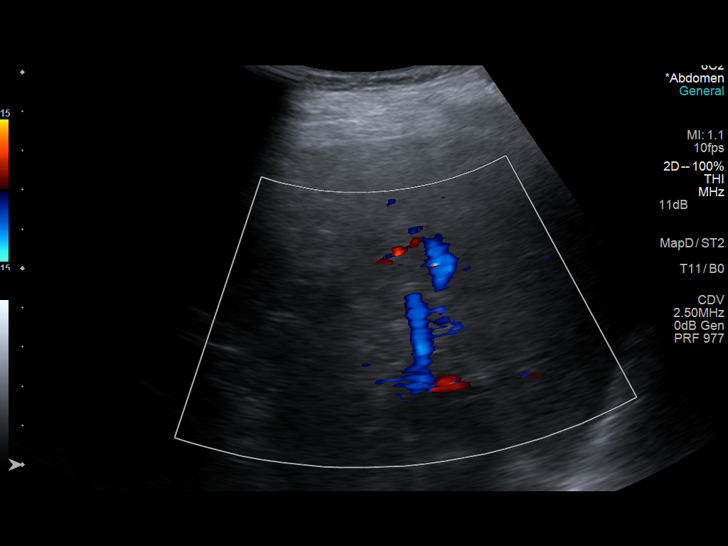
[im 137/137]
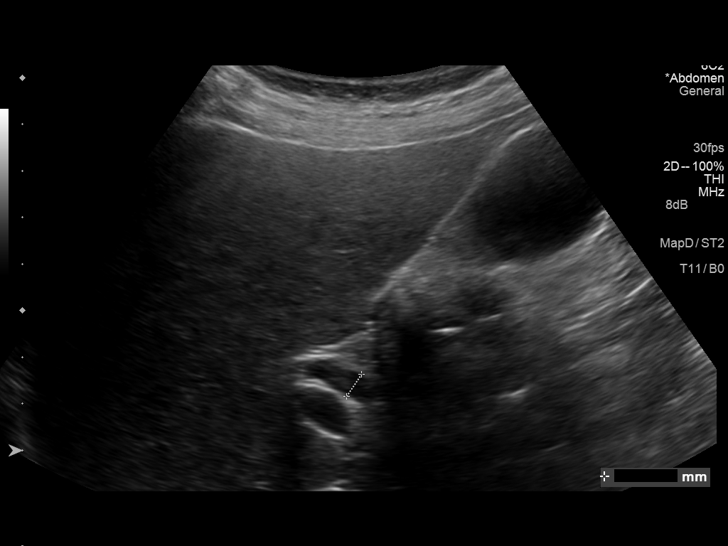

[14 of 25 positions shown; findings below may reference images not displayed]

FINDINGS: Gallbladder: 2 small mobile gallstones are noted within the
gallbladder, the largest measuring 11 mm. No wall thickening.
Negative sonographic Athan.

Common bile duct: Diameter: Normal caliber, 6 mm.

Liver: No focal lesion identified. Within normal limits in
parenchymal echogenicity.

IVC: No abnormality visualized.

Pancreas: Visualized portion unremarkable.

Spleen: Size and appearance within normal limits.

Right Kidney: Length: 10.4 cm. Echogenicity within normal limits. No
mass or hydronephrosis visualized.

Left Kidney: Length: 10.1 cm. Echogenicity within normal limits. No
mass or hydronephrosis visualized.

Abdominal aorta: No aneurysm visualized.

Other findings: None.
IMPRESSION: Cholelithiasis.  No sonographic evidence of acute cholecystitis.

## 2015-11-11 ENCOUNTER — Ambulatory Visit (INDEPENDENT_AMBULATORY_CARE_PROVIDER_SITE_OTHER): Payer: Self-pay | Admitting: Otolaryngology

## 2016-05-29 DIAGNOSIS — Z7689 Persons encountering health services in other specified circumstances: Secondary | ICD-10-CM | POA: Diagnosis not present

## 2016-05-29 DIAGNOSIS — L0390 Cellulitis, unspecified: Secondary | ICD-10-CM | POA: Diagnosis not present

## 2016-05-29 DIAGNOSIS — W57XXXA Bitten or stung by nonvenomous insect and other nonvenomous arthropods, initial encounter: Secondary | ICD-10-CM | POA: Diagnosis not present

## 2016-07-19 DIAGNOSIS — D649 Anemia, unspecified: Secondary | ICD-10-CM | POA: Diagnosis not present

## 2016-07-19 DIAGNOSIS — Z634 Disappearance and death of family member: Secondary | ICD-10-CM | POA: Diagnosis not present

## 2016-07-19 DIAGNOSIS — I10 Essential (primary) hypertension: Secondary | ICD-10-CM | POA: Diagnosis not present

## 2016-07-19 DIAGNOSIS — N182 Chronic kidney disease, stage 2 (mild): Secondary | ICD-10-CM | POA: Diagnosis not present

## 2016-07-19 DIAGNOSIS — I129 Hypertensive chronic kidney disease with stage 1 through stage 4 chronic kidney disease, or unspecified chronic kidney disease: Secondary | ICD-10-CM | POA: Diagnosis not present

## 2016-07-20 DIAGNOSIS — N182 Chronic kidney disease, stage 2 (mild): Secondary | ICD-10-CM | POA: Diagnosis not present

## 2016-07-20 DIAGNOSIS — E785 Hyperlipidemia, unspecified: Secondary | ICD-10-CM | POA: Diagnosis not present

## 2016-07-20 DIAGNOSIS — H9311 Tinnitus, right ear: Secondary | ICD-10-CM | POA: Diagnosis not present

## 2016-07-20 DIAGNOSIS — I10 Essential (primary) hypertension: Secondary | ICD-10-CM | POA: Diagnosis not present

## 2016-10-05 DIAGNOSIS — H52223 Regular astigmatism, bilateral: Secondary | ICD-10-CM | POA: Diagnosis not present

## 2016-10-05 DIAGNOSIS — H524 Presbyopia: Secondary | ICD-10-CM | POA: Diagnosis not present

## 2016-10-05 DIAGNOSIS — H5203 Hypermetropia, bilateral: Secondary | ICD-10-CM | POA: Diagnosis not present

## 2017-01-30 DIAGNOSIS — I10 Essential (primary) hypertension: Secondary | ICD-10-CM | POA: Diagnosis not present

## 2017-01-30 DIAGNOSIS — N182 Chronic kidney disease, stage 2 (mild): Secondary | ICD-10-CM | POA: Diagnosis not present

## 2017-01-30 DIAGNOSIS — I129 Hypertensive chronic kidney disease with stage 1 through stage 4 chronic kidney disease, or unspecified chronic kidney disease: Secondary | ICD-10-CM | POA: Diagnosis not present

## 2017-01-30 DIAGNOSIS — D649 Anemia, unspecified: Secondary | ICD-10-CM | POA: Diagnosis not present

## 2017-02-14 DIAGNOSIS — Z0000 Encounter for general adult medical examination without abnormal findings: Secondary | ICD-10-CM | POA: Diagnosis not present

## 2017-03-06 DIAGNOSIS — Z1211 Encounter for screening for malignant neoplasm of colon: Secondary | ICD-10-CM | POA: Diagnosis not present

## 2017-03-06 LAB — FECAL OCCULT BLOOD, GUAIAC: Fecal Occult Blood: NEGATIVE

## 2017-05-28 DIAGNOSIS — I10 Essential (primary) hypertension: Secondary | ICD-10-CM | POA: Diagnosis not present

## 2017-05-28 DIAGNOSIS — J301 Allergic rhinitis due to pollen: Secondary | ICD-10-CM | POA: Diagnosis not present

## 2017-08-03 DIAGNOSIS — N182 Chronic kidney disease, stage 2 (mild): Secondary | ICD-10-CM | POA: Diagnosis not present

## 2017-08-03 DIAGNOSIS — Z634 Disappearance and death of family member: Secondary | ICD-10-CM | POA: Diagnosis not present

## 2017-08-03 DIAGNOSIS — D649 Anemia, unspecified: Secondary | ICD-10-CM | POA: Diagnosis not present

## 2017-08-03 DIAGNOSIS — I10 Essential (primary) hypertension: Secondary | ICD-10-CM | POA: Diagnosis not present

## 2017-08-03 DIAGNOSIS — I129 Hypertensive chronic kidney disease with stage 1 through stage 4 chronic kidney disease, or unspecified chronic kidney disease: Secondary | ICD-10-CM | POA: Diagnosis not present

## 2017-08-06 DIAGNOSIS — N182 Chronic kidney disease, stage 2 (mild): Secondary | ICD-10-CM | POA: Diagnosis not present

## 2017-08-06 DIAGNOSIS — I129 Hypertensive chronic kidney disease with stage 1 through stage 4 chronic kidney disease, or unspecified chronic kidney disease: Secondary | ICD-10-CM | POA: Diagnosis not present

## 2017-08-06 DIAGNOSIS — J301 Allergic rhinitis due to pollen: Secondary | ICD-10-CM | POA: Diagnosis not present

## 2017-08-06 DIAGNOSIS — I10 Essential (primary) hypertension: Secondary | ICD-10-CM | POA: Diagnosis not present

## 2017-08-27 DIAGNOSIS — L0390 Cellulitis, unspecified: Secondary | ICD-10-CM | POA: Diagnosis not present

## 2017-08-27 DIAGNOSIS — W57XXXA Bitten or stung by nonvenomous insect and other nonvenomous arthropods, initial encounter: Secondary | ICD-10-CM | POA: Diagnosis not present

## 2017-10-02 DIAGNOSIS — H2513 Age-related nuclear cataract, bilateral: Secondary | ICD-10-CM | POA: Diagnosis not present

## 2017-10-02 DIAGNOSIS — H538 Other visual disturbances: Secondary | ICD-10-CM | POA: Diagnosis not present

## 2017-10-02 DIAGNOSIS — H25013 Cortical age-related cataract, bilateral: Secondary | ICD-10-CM | POA: Diagnosis not present

## 2017-10-09 DIAGNOSIS — N182 Chronic kidney disease, stage 2 (mild): Secondary | ICD-10-CM | POA: Diagnosis not present

## 2017-10-09 DIAGNOSIS — D649 Anemia, unspecified: Secondary | ICD-10-CM | POA: Diagnosis not present

## 2017-10-09 DIAGNOSIS — I10 Essential (primary) hypertension: Secondary | ICD-10-CM | POA: Diagnosis not present

## 2017-10-09 DIAGNOSIS — I129 Hypertensive chronic kidney disease with stage 1 through stage 4 chronic kidney disease, or unspecified chronic kidney disease: Secondary | ICD-10-CM | POA: Diagnosis not present

## 2017-11-01 DIAGNOSIS — Z23 Encounter for immunization: Secondary | ICD-10-CM | POA: Diagnosis not present

## 2017-12-20 DIAGNOSIS — H52203 Unspecified astigmatism, bilateral: Secondary | ICD-10-CM | POA: Diagnosis not present

## 2017-12-20 DIAGNOSIS — H25013 Cortical age-related cataract, bilateral: Secondary | ICD-10-CM | POA: Diagnosis not present

## 2017-12-20 DIAGNOSIS — H524 Presbyopia: Secondary | ICD-10-CM | POA: Diagnosis not present

## 2017-12-20 DIAGNOSIS — H5203 Hypermetropia, bilateral: Secondary | ICD-10-CM | POA: Diagnosis not present

## 2017-12-20 DIAGNOSIS — H2513 Age-related nuclear cataract, bilateral: Secondary | ICD-10-CM | POA: Diagnosis not present

## 2018-04-08 DIAGNOSIS — D649 Anemia, unspecified: Secondary | ICD-10-CM | POA: Diagnosis not present

## 2018-04-08 DIAGNOSIS — I10 Essential (primary) hypertension: Secondary | ICD-10-CM | POA: Diagnosis not present

## 2018-04-08 DIAGNOSIS — I129 Hypertensive chronic kidney disease with stage 1 through stage 4 chronic kidney disease, or unspecified chronic kidney disease: Secondary | ICD-10-CM | POA: Diagnosis not present

## 2018-04-08 DIAGNOSIS — N182 Chronic kidney disease, stage 2 (mild): Secondary | ICD-10-CM | POA: Diagnosis not present

## 2018-06-12 DIAGNOSIS — I129 Hypertensive chronic kidney disease with stage 1 through stage 4 chronic kidney disease, or unspecified chronic kidney disease: Secondary | ICD-10-CM | POA: Diagnosis not present

## 2018-06-12 DIAGNOSIS — N182 Chronic kidney disease, stage 2 (mild): Secondary | ICD-10-CM | POA: Diagnosis not present

## 2018-06-12 DIAGNOSIS — I10 Essential (primary) hypertension: Secondary | ICD-10-CM | POA: Diagnosis not present

## 2018-06-12 DIAGNOSIS — J301 Allergic rhinitis due to pollen: Secondary | ICD-10-CM | POA: Diagnosis not present

## 2018-07-29 DIAGNOSIS — J019 Acute sinusitis, unspecified: Secondary | ICD-10-CM | POA: Diagnosis not present

## 2018-07-29 DIAGNOSIS — I10 Essential (primary) hypertension: Secondary | ICD-10-CM | POA: Diagnosis not present

## 2018-09-05 DIAGNOSIS — I10 Essential (primary) hypertension: Secondary | ICD-10-CM | POA: Diagnosis not present

## 2018-09-05 DIAGNOSIS — H9311 Tinnitus, right ear: Secondary | ICD-10-CM | POA: Diagnosis not present

## 2018-09-05 DIAGNOSIS — I129 Hypertensive chronic kidney disease with stage 1 through stage 4 chronic kidney disease, or unspecified chronic kidney disease: Secondary | ICD-10-CM | POA: Diagnosis not present

## 2018-09-05 DIAGNOSIS — D649 Anemia, unspecified: Secondary | ICD-10-CM | POA: Diagnosis not present

## 2018-09-05 LAB — HEPATIC FUNCTION PANEL
ALT: 23 (ref 7–35)
AST: 22 (ref 13–35)
Alkaline Phosphatase: 82 (ref 25–125)
Bilirubin, Total: 0.5

## 2018-09-05 LAB — CBC AND DIFFERENTIAL
HCT: 47 — AB (ref 36–46)
Hemoglobin: 16.4 — AB (ref 12.0–16.0)
Platelets: 247 (ref 150–399)
WBC: 7.2

## 2018-09-05 LAB — TSH: TSH: 3.34 (ref <=5.90)

## 2018-09-05 LAB — CBC: RBC: 4.84 (ref 3.87–5.11)

## 2018-09-05 LAB — LIPID PANEL
Cholesterol: 199 (ref 0–200)
HDL: 63 (ref 35–70)
LDL Cholesterol: 99
Triglycerides: 187 — AB (ref 40–160)

## 2018-09-11 DIAGNOSIS — E875 Hyperkalemia: Secondary | ICD-10-CM | POA: Diagnosis not present

## 2018-09-11 DIAGNOSIS — I129 Hypertensive chronic kidney disease with stage 1 through stage 4 chronic kidney disease, or unspecified chronic kidney disease: Secondary | ICD-10-CM | POA: Diagnosis not present

## 2018-09-11 DIAGNOSIS — I10 Essential (primary) hypertension: Secondary | ICD-10-CM | POA: Diagnosis not present

## 2018-09-13 DIAGNOSIS — I10 Essential (primary) hypertension: Secondary | ICD-10-CM | POA: Diagnosis not present

## 2018-09-25 DIAGNOSIS — E875 Hyperkalemia: Secondary | ICD-10-CM | POA: Diagnosis not present

## 2018-09-25 LAB — BASIC METABOLIC PANEL
BUN: 11 (ref 4–21)
CO2: 24 — AB (ref 13–22)
Chloride: 103 (ref 99–108)
Creatinine: 1.1 (ref 0.5–1.1)
Glucose: 99
Potassium: 4 (ref 3.4–5.3)
Sodium: 142 (ref 137–147)

## 2018-09-25 LAB — COMPREHENSIVE METABOLIC PANEL
Calcium: 9.1 (ref 8.7–10.7)
GFR calc Af Amer: 55
GFR calc non Af Amer: 47

## 2018-09-26 DIAGNOSIS — E875 Hyperkalemia: Secondary | ICD-10-CM | POA: Diagnosis not present

## 2018-09-26 DIAGNOSIS — I10 Essential (primary) hypertension: Secondary | ICD-10-CM | POA: Diagnosis not present

## 2018-09-26 DIAGNOSIS — I129 Hypertensive chronic kidney disease with stage 1 through stage 4 chronic kidney disease, or unspecified chronic kidney disease: Secondary | ICD-10-CM | POA: Diagnosis not present

## 2018-10-16 DIAGNOSIS — Z23 Encounter for immunization: Secondary | ICD-10-CM | POA: Diagnosis not present

## 2018-12-24 DIAGNOSIS — N182 Chronic kidney disease, stage 2 (mild): Secondary | ICD-10-CM | POA: Diagnosis not present

## 2018-12-24 DIAGNOSIS — I10 Essential (primary) hypertension: Secondary | ICD-10-CM | POA: Diagnosis not present

## 2018-12-24 DIAGNOSIS — E875 Hyperkalemia: Secondary | ICD-10-CM | POA: Diagnosis not present

## 2018-12-24 DIAGNOSIS — D649 Anemia, unspecified: Secondary | ICD-10-CM | POA: Diagnosis not present

## 2018-12-26 DIAGNOSIS — I10 Essential (primary) hypertension: Secondary | ICD-10-CM | POA: Diagnosis not present

## 2018-12-26 DIAGNOSIS — E875 Hyperkalemia: Secondary | ICD-10-CM | POA: Diagnosis not present

## 2018-12-26 DIAGNOSIS — I129 Hypertensive chronic kidney disease with stage 1 through stage 4 chronic kidney disease, or unspecified chronic kidney disease: Secondary | ICD-10-CM | POA: Diagnosis not present

## 2019-01-04 DIAGNOSIS — D649 Anemia, unspecified: Secondary | ICD-10-CM | POA: Insufficient documentation

## 2019-01-04 DIAGNOSIS — H9311 Tinnitus, right ear: Secondary | ICD-10-CM

## 2019-01-04 DIAGNOSIS — I129 Hypertensive chronic kidney disease with stage 1 through stage 4 chronic kidney disease, or unspecified chronic kidney disease: Secondary | ICD-10-CM | POA: Insufficient documentation

## 2019-01-04 DIAGNOSIS — N182 Chronic kidney disease, stage 2 (mild): Secondary | ICD-10-CM

## 2019-01-04 DIAGNOSIS — I160 Hypertensive urgency: Secondary | ICD-10-CM | POA: Insufficient documentation

## 2019-01-04 DIAGNOSIS — I10 Essential (primary) hypertension: Secondary | ICD-10-CM | POA: Insufficient documentation

## 2019-01-04 DIAGNOSIS — E875 Hyperkalemia: Secondary | ICD-10-CM | POA: Insufficient documentation

## 2019-01-04 DIAGNOSIS — N1831 Chronic kidney disease, stage 3a: Secondary | ICD-10-CM | POA: Insufficient documentation

## 2019-01-04 DIAGNOSIS — J019 Acute sinusitis, unspecified: Secondary | ICD-10-CM | POA: Insufficient documentation

## 2019-01-13 ENCOUNTER — Other Ambulatory Visit: Payer: Self-pay

## 2019-01-13 NOTE — Patient Outreach (Signed)
[facility]) Care Management  01/13/2019  Joanne White 06-07-1940 [id number]   Medication Adherence call to Joanne White HIPPA Compliant Voice message left with a call back number. Joanne White is showing past due on Olmesartan 40 mg under [place].   [place] Management Direct Dial [phone]  Fax [phone] [email or web address]

## 2019-01-16 ENCOUNTER — Other Ambulatory Visit: Payer: Self-pay

## 2019-01-16 NOTE — Patient Outreach (Signed)
[facility]) Care Management  01/16/2019  Joanne White 1940-06-17 [id number]   Medication Adherence call to [name] spoke with patient she is past due on Olmesartan 40 mg by 61 days under [place] health Care Ins. Patient explain she does not feel comfortable providing any of her information and hung. [name] was showing past due under [place].   [place] Management Direct Dial [phone]  Fax [phone] [email or web address]

## 2019-02-20 ENCOUNTER — Encounter: Payer: Self-pay | Admitting: Family Medicine

## 2019-02-20 ENCOUNTER — Ambulatory Visit (INDEPENDENT_AMBULATORY_CARE_PROVIDER_SITE_OTHER): Payer: Medicare Other | Admitting: Family Medicine

## 2019-02-20 ENCOUNTER — Other Ambulatory Visit: Payer: Self-pay

## 2019-02-20 VITALS — BP 148/78 | HR 72 | Temp 97.9°F | Resp 12 | Ht 64.0 in | Wt 167.6 lb

## 2019-02-20 DIAGNOSIS — Z91018 Allergy to other foods: Secondary | ICD-10-CM | POA: Diagnosis not present

## 2019-02-20 DIAGNOSIS — I10 Essential (primary) hypertension: Secondary | ICD-10-CM

## 2019-02-20 MED ORDER — AMLODIPINE BESYLATE 5 MG PO TABS: 5.0000 mg | ORAL_TABLET | Freq: Every day | ORAL | 1 refills | Status: DC

## 2019-02-20 NOTE — Patient Instructions (Addendum)
Allergy referral Vit D 1000IU

## 2019-02-20 NOTE — Progress Notes (Signed)
New Patient Office Visit  Subjective:  Patient ID: Joanne White, female    DOB: [birth year]  Age: 79 y.o. MRN: [id number]  CC:  Chief Complaint  Patient presents with  . Establish Care    New pt appointment    HPI Joanne White presents for BP-home readings 124 systolic-wrist monitor-no headaches, no chest pain Pt with long term use-no LE edema  Past Medical History:  Diagnosis Date  . Abnormal weight loss   . Acute sinusitis, unspecified   . Allergy   . Anemia, unspecified   . Chronic kidney disease, stage 2 (mild)   . Disappearance and death of family member   . Dizziness and giddiness   . Essential (primary) hypertension   . HTN (hypertension), benign 03/03/2014  . Hyperkalemia   . Hypertension   . Hypertensive chronic kidney disease with stage 1 through stage 4 chronic kidney disease, or unspecified chronic kidney disease   . Pneumonia, unspecified organism   . Tinnitus, right ear   . Vertigo     Past Surgical History:  Procedure Laterality Date  . CHOLECYSTECTOMY  03/04/2014   Procedure: LAPAROSCOPIC CHOLECYSTECTOMY;  Surgeon: Romie Levee, MD;  Location: [facility];  Service: General;;  . NO PAST SURGERIES      Family History  Problem Relation Age of Onset  . Hypertension Father   . Cancer Father        lung  . Heart attack Brother   . Hypertension Brother     Social History   Socioeconomic History  . Marital status: Single    Spouse name: Not on file  . Number of children: Not on file  . Years of education: Not on file  . Highest education level: Not on file  Occupational History  . Not on file  Tobacco Use  . Smoking status: Former Smoker    Quit date: 03/03/2011    Years since quitting: 7.9  . Smokeless tobacco: Never Used  Substance and Sexual Activity  . Alcohol use: Yes    Comment: 1 beer once or twice a week and a rare mixed drink  . Drug use: No  . Sexual activity: Not on file  Other Topics Concern  . Not on file  Social History  Narrative  . Not on file   Social Determinants of Health   Financial Resource Strain:   . Difficulty of Paying Living Expenses: Not on file  Food Insecurity:   . Worried About Programme researcher, broadcasting/film/video in the Last Year: Not on file  . Ran Out of Food in the Last Year: Not on file  Transportation Needs:   . Lack of Transportation (Medical): Not on file  . Lack of Transportation (Non-Medical): Not on file  Physical Activity:   . Days of Exercise per Week: Not on file  . Minutes of Exercise per Session: Not on file  Stress:   . Feeling of Stress : Not on file  Social Connections:   . Frequency of Communication with Friends and Family: Not on file  . Frequency of Social Gatherings with Friends and Family: Not on file  . Attends Religious Services: Not on file  . Active Member of Clubs or Organizations: Not on file  . Attends Banker Meetings: Not on file  . Marital Status: Not on file  Intimate Partner Violence:   . Fear of Current or Ex-Partner: Not on file  . Emotionally Abused: Not on file  . Physically Abused: Not on  file  . Sexually Abused: Not on file    ROS Review of Systems  Constitutional: Negative.   HENT: Positive for tinnitus.   Eyes:       Glasses  Respiratory: Negative.   Cardiovascular: Negative.   Gastrointestinal: Negative.   Genitourinary: Negative.   Musculoskeletal: Negative.   Allergic/Immunologic: Positive for environmental allergies.  Neurological: Negative.   Hematological: Negative.   Psychiatric/Behavioral: Negative.     Objective:   Today's Vitals: BP (!) 148/78   Pulse 72   Temp 97.9 F (36.6 C)   Resp 12   Ht 5\' 4"  (1.626 m)   Wt 167 lb 9.6 oz (76 kg)   BMI 28.77 kg/m   Physical Exam Constitutional:      Appearance: Normal appearance.  HENT:     Head: Normocephalic and atraumatic.     Nose: Nose normal.  Eyes:     Conjunctiva/sclera: Conjunctivae normal.  Cardiovascular:     Rate and Rhythm: Normal rate and regular  rhythm.     Pulses: Normal pulses.     Heart sounds: Normal heart sounds.  Pulmonary:     Effort: Pulmonary effort is normal.     Breath sounds: Normal breath sounds.  Musculoskeletal:        General: Normal range of motion.     Cervical back: Normal range of motion and neck supple.  Neurological:     Mental Status: She is alert and oriented to person, place, and time.  Psychiatric:        Mood and Affect: Mood normal.        Behavior: Behavior normal.     Assessment & Plan:   1. Allergy to alpha-gal - Ambulatory referral to Allergy  2. Essential (primary) hypertension Amlodipine 5mg -rx ecg 2016 Renal function normal Outpatient Encounter Medications as of 02/20/2019  Medication Sig  . amLODipine (NORVASC) 5 MG tablet Take 5 mg by mouth daily.  . meclizine (ANTIVERT) 12.5 MG tablet Take 1 tablet (12.5 mg total) by mouth 3 (three) times daily as needed for dizziness.  . Omega-3 Fatty Acids (FISH OIL) 1000 MG CAPS Take 1,000 mg by mouth daily.   . [DISCONTINUED] aspirin EC 81 MG tablet Take 81 mg by mouth daily.  . [DISCONTINUED] Iron-FA-B Cmp-C-Biot-Probiotic (FUSION PLUS) CAPS Take 1 capsule by mouth daily.  . [DISCONTINUED] lisinopril (PRINIVIL,ZESTRIL) 40 MG tablet Take 40 mg by mouth daily.   No facility-administered encounter medications on file as of 02/20/2019.    Follow-up: 6 months Consider allergy referral Kerri-Anne Haeberle Hannah Beat, MD

## 2019-04-14 ENCOUNTER — Ambulatory Visit (INDEPENDENT_AMBULATORY_CARE_PROVIDER_SITE_OTHER): Payer: Medicare Other | Admitting: Family Medicine

## 2019-04-14 ENCOUNTER — Encounter: Payer: Self-pay | Admitting: Family Medicine

## 2019-04-14 ENCOUNTER — Encounter: Payer: Self-pay | Admitting: Emergency Medicine

## 2019-04-14 ENCOUNTER — Ambulatory Visit (HOSPITAL_COMMUNITY)
Admission: RE | Admit: 2019-04-14 | Discharge: 2019-04-14 | Disposition: A | Discharge status: Home / Self Care | Payer: Medicare Other | Source: Ambulatory Visit | Attending: Family Medicine | Admitting: Family Medicine

## 2019-04-14 ENCOUNTER — Other Ambulatory Visit: Payer: Self-pay

## 2019-04-14 VITALS — BP 140/82 | HR 65 | Temp 97.8°F | Ht 64.0 in | Wt 167.4 lb

## 2019-04-14 DIAGNOSIS — M79672 Pain in left foot: Secondary | ICD-10-CM | POA: Insufficient documentation

## 2019-04-14 DIAGNOSIS — M25572 Pain in left ankle and joints of left foot: Secondary | ICD-10-CM | POA: Diagnosis not present

## 2019-04-14 MED ORDER — MELOXICAM 7.5 MG PO TABS: 7.5000 mg | ORAL_TABLET | Freq: Every day | ORAL | 0 refills | Status: DC

## 2019-04-14 NOTE — Patient Instructions (Addendum)
Elevation, ice, trial of mobic Xray Ankle support    If you have lab work done today you will be contacted with your lab results within the next 2 weeks.  If you have not heard from [place] then please contact us. The fastest way to get your results is to register for My Chart.   IF you received an x-ray today, you will receive an invoice from [facility]. Please contact [facility] at [phone] with questions or concerns regarding your invoice.   IF you received labwork today, you will receive an invoice from [facility]. Please contact [facility] at [phone] with questions or concerns regarding your invoice.   Our billing staff will not be able to assist you with questions regarding bills from these companies.  You will be contacted with the lab results as soon as they are available. The fastest way to get your results is to activate your My Chart account. Instructions are located on the last page of this paperwork. If you have not heard from [place] regarding the results in 2 weeks, please contact this office.

## 2019-04-14 NOTE — Progress Notes (Signed)
Established Patient Office Visit  Subjective:  Patient ID: Joanne White, female    DOB: March 18, 1940  Age: 79 y.o. MRN: [id number]  CC:  Chief Complaint  Patient presents with  . sore ankle    left ankle has been sore and swelling for a week now. Patient stated she do not remember hurting ankle in any way.    HPI ELONI DARIUS presents for left ankle and foot with pain noted for 1 week-worsening. Pt states old injury of the left foot playing softball but no known acute concern. Pt has not noted redness or swelling. Pt states small amount of swelling. Pt able to wear regular shoes.  No h/o gout. Pt taking tylenol at night.     Past Medical History:  Diagnosis Date  . Abnormal weight loss   . Acute sinusitis, unspecified   . Allergy   . Anemia, unspecified   . Chronic kidney disease, stage 2 (mild)   . Disappearance and death of family member   . Dizziness and giddiness   . Essential (primary) hypertension   . HTN (hypertension), benign 03/03/2014  . Hyperkalemia   . Hypertension   . Hypertensive chronic kidney disease with stage 1 through stage 4 chronic kidney disease, or unspecified chronic kidney disease   . Pneumonia, unspecified organism   . Tinnitus, right ear   . Vertigo     Past Surgical History:  Procedure Laterality Date  . CHOLECYSTECTOMY  03/04/2014   Procedure: LAPAROSCOPIC CHOLECYSTECTOMY;  Surgeon: Leighton Ruff, MD;  Location: [facility];  Service: General;;  . NO PAST SURGERIES      Family History  Problem Relation Age of Onset  . Hypertension Father   . Cancer Father        lung  . Heart attack Brother   . Hypertension Brother     Social History   Socioeconomic History  . Marital status: Single    Spouse name: Not on file  . Number of children: Not on file  . Years of education: Not on file  . Highest education level: Not on file  Occupational History  . Not on file  Tobacco Use  . Smoking status: Former Smoker    Quit date: 03/03/2011   Years since quitting: 8.1  . Smokeless tobacco: Never Used  Substance and Sexual Activity  . Alcohol use: Yes    Comment: 1 beer once or twice a week and a rare mixed drink  . Drug use: No  . Sexual activity: Not on file  Other Topics Concern  . Not on file  Social History Narrative  . Not on file   Social Determinants of Health   Financial Resource Strain:   . Difficulty of Paying Living Expenses: Not on file  Food Insecurity:   . Worried About Charity fundraiser in the Last Year: Not on file  . Ran Out of Food in the Last Year: Not on file  Transportation Needs:   . Lack of Transportation (Medical): Not on file  . Lack of Transportation (Non-Medical): Not on file  Physical Activity:   . Days of Exercise per Week: Not on file  . Minutes of Exercise per Session: Not on file  Stress:   . Feeling of Stress : Not on file  Social Connections:   . Frequency of Communication with Friends and Family: Not on file  . Frequency of Social Gatherings with Friends and Family: Not on file  . Attends Religious Services: Not on  file  . Active Member of Clubs or Organizations: Not on file  . Attends Banker Meetings: Not on file  . Marital Status: Not on file  Intimate Partner Violence:   . Fear of Current or Ex-Partner: Not on file  . Emotionally Abused: Not on file  . Physically Abused: Not on file  . Sexually Abused: Not on file    Outpatient Medications Prior to Visit  Medication Sig Dispense Refill  . amLODipine (NORVASC) 5 MG tablet Take 1 tablet (5 mg total) by mouth daily. 90 tablet 1  . meclizine (ANTIVERT) 12.5 MG tablet Take 1 tablet (12.5 mg total) by mouth 3 (three) times daily as needed for dizziness. 30 tablet 0  . Omega-3 Fatty Acids (FISH OIL) 1000 MG CAPS Take 1,000 mg by mouth daily.      No facility-administered medications prior to visit.    Allergies  Allergen Reactions  . Codeine Nausea And Vomiting    ROS Review of Systems    Musculoskeletal: Positive for arthralgias.       Left foot pain      Objective:    Physical Exam  Constitutional: She appears well-developed.  Musculoskeletal:        General: Tenderness and deformity present. No edema.       Feet:    BP 140/82 (BP Location: Right Arm, Patient Position: Sitting, Cuff Size: Normal)   Pulse 65   Temp 97.8 F (36.6 C) (Temporal)   Ht 5\' 4"  (1.626 m)   Wt 167 lb 6.4 oz (75.9 kg)   SpO2 98%   BMI 28.73 kg/m  Wt Readings from Last 3 Encounters:  04/14/19 167 lb 6.4 oz (75.9 kg)  02/20/19 167 lb 9.6 oz (76 kg)  08/05/14 136 lb (61.7 kg)     Health Maintenance Due  Topic Date Due  . TETANUS/TDAP  07/24/1960  . DEXA SCAN  07/25/2006  . PNA vac Low Risk Adult (1 of 2 - PCV13) 07/25/2006    Lab Results  Component Value Date   TSH 3.34 09/05/2018   Lab Results  Component Value Date   WBC 7.2 09/05/2018   HGB 16.4 (A) 09/05/2018   HCT 47 (A) 09/05/2018   MCV 95.9 08/05/2014   PLT 247 09/05/2018   Lab Results  Component Value Date   NA 142 09/25/2018   K 4.0 09/25/2018   CO2 24 (A) 09/25/2018   GLUCOSE 106 (H) 08/05/2014   BUN 11 09/25/2018   CREATININE 1.1 09/25/2018   BILITOT 0.4 08/05/2014   ALKPHOS 82 09/05/2018   AST 22 09/05/2018   ALT 23 09/05/2018   PROT 7.4 08/05/2014   ALBUMIN 4.0 08/05/2014   CALCIUM 9.1 09/25/2018   ANIONGAP 8 08/05/2014   Lab Results  Component Value Date   CHOL 199 09/05/2018   Lab Results  Component Value Date   HDL 63 09/05/2018   Lab Results  Component Value Date   LDLCALC 99 09/05/2018   Lab Results  Component Value Date   TRIG 187 (A) 09/05/2018     Assessment & Plan:  1. Pain of left foot mobic 7.5mg  - DG Ankle 2 Views Left; Future - DG Foot 2 Views Left; Future  Follow-up: xray foot and ankle  Mattea Seger 09/07/2018, MD

## 2019-04-15 ENCOUNTER — Telehealth: Payer: Self-pay | Admitting: Family Medicine

## 2019-04-15 NOTE — Telephone Encounter (Signed)
Pt would like call back with Xray results

## 2019-04-16 NOTE — Telephone Encounter (Signed)
Patient was informed of msg below and will take the calcium 600 mg+ vit d

## 2019-05-20 DIAGNOSIS — H8110 Benign paroxysmal vertigo, unspecified ear: Secondary | ICD-10-CM | POA: Diagnosis not present

## 2019-05-20 DIAGNOSIS — I10 Essential (primary) hypertension: Secondary | ICD-10-CM | POA: Diagnosis not present

## 2019-05-20 DIAGNOSIS — Z0189 Encounter for other specified special examinations: Secondary | ICD-10-CM | POA: Diagnosis not present

## 2019-05-23 DIAGNOSIS — W57XXXA Bitten or stung by nonvenomous insect and other nonvenomous arthropods, initial encounter: Secondary | ICD-10-CM | POA: Diagnosis not present

## 2019-05-23 DIAGNOSIS — Z7689 Persons encountering health services in other specified circumstances: Secondary | ICD-10-CM | POA: Diagnosis not present

## 2019-06-01 ENCOUNTER — Other Ambulatory Visit: Payer: Self-pay

## 2019-06-01 ENCOUNTER — Ambulatory Visit
Admission: EM | Admit: 2019-06-01 | Discharge: 2019-06-01 | Disposition: A | Discharge status: Home / Self Care | Payer: Medicare Other | Attending: Emergency Medicine | Admitting: Emergency Medicine

## 2019-06-01 DIAGNOSIS — M25561 Pain in right knee: Secondary | ICD-10-CM | POA: Diagnosis not present

## 2019-06-01 DIAGNOSIS — R030 Elevated blood-pressure reading, without diagnosis of hypertension: Secondary | ICD-10-CM

## 2019-06-01 NOTE — Discharge Instructions (Addendum)
Continue conservative management of rest, ice, and gentle stretches Take tylenol arthritis for pain Knee brace given Follow up with PCP for recheck next week Return or go to the ER if you have any new or worsening symptoms (fever, chills, redness, swelling, bruising, deformity, worsening symptoms despite, medication, etc...)   Blood pressure elevated in office.  Please recheck in 24 hours.  If it continues to be greater than 140/90 please follow up with PCP for further evaluation and management.

## 2019-06-01 NOTE — ED Triage Notes (Signed)
Pt presents with right knee pain that began about 4 days ago, denies injury

## 2019-06-01 NOTE — ED Provider Notes (Signed)
[facility]   [id number] 06/01/19 Arrival Time: 8546  CC: RT knee pain  SUBJECTIVE: History from: patient. Joanne White is a 79 y.o. female complains of RT knee paini that began 4 days ago.  Denies a precipitating event or specific injury.  States overall her symptoms are improving.  Localizes the pain to the outside of left knee.  Describes the pain as constant and sore in character.  Has tried OTC medications with relief.  Symptoms are made worse with weight-bearing, and walking.  Denies similar symptoms in the past.  Denies fever, chills, erythema, ecchymosis, effusion, weakness, numbness and tingling.  ROS: As per HPI.  All other pertinent ROS negative.     Past Medical History:  Diagnosis Date  . Abnormal weight loss   . Acute sinusitis, unspecified   . Allergy   . Anemia, unspecified   . Chronic kidney disease, stage 2 (mild)   . Disappearance and death of family member   . Dizziness and giddiness   . Essential (primary) hypertension   . HTN (hypertension), benign 03/03/2014  . Hyperkalemia   . Hypertension   . Hypertensive chronic kidney disease with stage 1 through stage 4 chronic kidney disease, or unspecified chronic kidney disease   . Pneumonia, unspecified organism   . Tinnitus, right ear   . Vertigo    Past Surgical History:  Procedure Laterality Date  . CHOLECYSTECTOMY  03/04/2014   Procedure: LAPAROSCOPIC CHOLECYSTECTOMY;  Surgeon: Leighton Ruff, MD;  Location: [facility];  Service: General;;  . NO PAST SURGERIES     Allergies  Allergen Reactions  . Codeine Nausea And Vomiting   No current facility-administered medications on file prior to encounter.   Current Outpatient Medications on File Prior to Encounter  Medication Sig Dispense Refill  . amLODipine (NORVASC) 5 MG tablet Take 1 tablet (5 mg total) by mouth daily. 90 tablet 1  . meclizine (ANTIVERT) 12.5 MG tablet Take 1 tablet (12.5 mg total) by mouth 3 (three) times daily as needed for  dizziness. 30 tablet 0  . meloxicam (MOBIC) 7.5 MG tablet Take 1 tablet (7.5 mg total) by mouth daily. 30 tablet 0   Social History   Socioeconomic History  . Marital status: Single    Spouse name: Not on file  . Number of children: Not on file  . Years of education: Not on file  . Highest education level: Not on file  Occupational History  . Not on file  Tobacco Use  . Smoking status: Former Smoker    Quit date: 03/03/2011    Years since quitting: 8.2  . Smokeless tobacco: Never Used  Substance and Sexual Activity  . Alcohol use: Yes    Comment: 1 beer once or twice a week and a rare mixed drink  . Drug use: No  . Sexual activity: Not on file  Other Topics Concern  . Not on file  Social History Narrative  . Not on file   Social Determinants of Health   Financial Resource Strain:   . Difficulty of Paying Living Expenses:   Food Insecurity:   . Worried About Charity fundraiser in the Last Year:   . Arboriculturist in the Last Year:   Transportation Needs:   . Film/video editor (Medical):   Marland Kitchen Lack of Transportation (Non-Medical):   Physical Activity:   . Days of Exercise per Week:   . Minutes of Exercise per Session:   Stress:   . Feeling  of Stress :   Social Connections:   . Frequency of Communication with Friends and Family:   . Frequency of Social Gatherings with Friends and Family:   . Attends Religious Services:   . Active Member of Clubs or Organizations:   . Attends Banker Meetings:   Marland Kitchen Marital Status:   Intimate Partner Violence:   . Fear of Current or Ex-Partner:   . Emotionally Abused:   Marland Kitchen Physically Abused:   . Sexually Abused:    Family History  Problem Relation Age of Onset  . Hypertension Father   . Cancer Father        lung  . Heart attack Brother   . Hypertension Brother     OBJECTIVE:  Vitals:   06/01/19 0944  BP: (!) 189/100  Pulse: 71  Resp: 18  Temp: 97.8 F (36.6 C)  SpO2: 98%    General appearance:  ALERT; in no acute distress.  Head: NCAT Lungs: Normal respiratory effort Musculoskeletal: RT knee Inspection: Skin warm, dry, clear and intact without obvious erythema, effusion, or ecchymosis.  Palpation: TTP over lateral aspect of knee ROM: FROM active and passive Strength:  5/5 knee flexion, 5/5 knee extension Skin: warm and dry Neurologic: Ambulates without difficulty; Sensation intact about the lower extremities Psychological: alert and cooperative; normal mood and affect  ASSESSMENT & PLAN:  1. Acute pain of right knee   2. Elevated blood pressure reading     Continue conservative management of rest, ice, and gentle stretches Take tylenol arthritis for pain Knee brace given Follow up with PCP for recheck next week Return or go to the ER if you have any new or worsening symptoms (fever, chills, redness, swelling, bruising, deformity, worsening symptoms despite, medication, etc...)   Blood pressure elevated in office.  Please recheck in 24 hours.  If it continues to be greater than 140/90 please follow up with PCP for further evaluation and management.    Reviewed expectations re: course of current medical issues. Questions answered. Outlined signs and symptoms indicating need for more acute intervention. Patient verbalized understanding. After Visit Summary given.    Rennis Harding, PA-C 06/01/19 [phone]

## 2019-06-19 DIAGNOSIS — R7303 Prediabetes: Secondary | ICD-10-CM | POA: Diagnosis not present

## 2019-06-19 DIAGNOSIS — Z0189 Encounter for other specified special examinations: Secondary | ICD-10-CM | POA: Diagnosis not present

## 2019-06-19 DIAGNOSIS — I10 Essential (primary) hypertension: Secondary | ICD-10-CM | POA: Diagnosis not present

## 2019-06-19 DIAGNOSIS — W57XXXA Bitten or stung by nonvenomous insect and other nonvenomous arthropods, initial encounter: Secondary | ICD-10-CM | POA: Diagnosis not present

## 2019-06-24 DIAGNOSIS — I10 Essential (primary) hypertension: Secondary | ICD-10-CM | POA: Diagnosis not present

## 2019-06-24 DIAGNOSIS — H8110 Benign paroxysmal vertigo, unspecified ear: Secondary | ICD-10-CM | POA: Diagnosis not present

## 2019-06-24 DIAGNOSIS — E781 Pure hyperglyceridemia: Secondary | ICD-10-CM | POA: Diagnosis not present

## 2019-06-24 DIAGNOSIS — R718 Other abnormality of red blood cells: Secondary | ICD-10-CM | POA: Diagnosis not present

## 2019-06-24 DIAGNOSIS — N1831 Chronic kidney disease, stage 3a: Secondary | ICD-10-CM | POA: Diagnosis not present

## 2019-09-03 ENCOUNTER — Ambulatory Visit: Payer: Medicare Other | Admitting: Family Medicine

## 2019-09-08 DIAGNOSIS — S61511A Laceration without foreign body of right wrist, initial encounter: Secondary | ICD-10-CM | POA: Diagnosis not present

## 2019-09-08 DIAGNOSIS — Z23 Encounter for immunization: Secondary | ICD-10-CM | POA: Diagnosis not present

## 2019-10-28 DIAGNOSIS — Z23 Encounter for immunization: Secondary | ICD-10-CM | POA: Diagnosis not present

## 2019-12-25 DIAGNOSIS — W57XXXA Bitten or stung by nonvenomous insect and other nonvenomous arthropods, initial encounter: Secondary | ICD-10-CM | POA: Diagnosis not present

## 2019-12-25 DIAGNOSIS — E039 Hypothyroidism, unspecified: Secondary | ICD-10-CM | POA: Diagnosis not present

## 2019-12-25 DIAGNOSIS — Z23 Encounter for immunization: Secondary | ICD-10-CM | POA: Diagnosis not present

## 2019-12-25 DIAGNOSIS — S61511A Laceration without foreign body of right wrist, initial encounter: Secondary | ICD-10-CM | POA: Diagnosis not present

## 2019-12-30 DIAGNOSIS — I10 Essential (primary) hypertension: Secondary | ICD-10-CM | POA: Diagnosis not present

## 2019-12-30 DIAGNOSIS — R718 Other abnormality of red blood cells: Secondary | ICD-10-CM | POA: Diagnosis not present

## 2019-12-30 DIAGNOSIS — N1831 Chronic kidney disease, stage 3a: Secondary | ICD-10-CM | POA: Diagnosis not present

## 2019-12-30 DIAGNOSIS — E781 Pure hyperglyceridemia: Secondary | ICD-10-CM | POA: Diagnosis not present

## 2019-12-30 DIAGNOSIS — H8110 Benign paroxysmal vertigo, unspecified ear: Secondary | ICD-10-CM | POA: Diagnosis not present

## 2020-01-09 ENCOUNTER — Encounter: Payer: Self-pay | Admitting: Emergency Medicine

## 2020-01-09 ENCOUNTER — Ambulatory Visit
Admission: EM | Admit: 2020-01-09 | Discharge: 2020-01-09 | Disposition: A | Discharge status: Home / Self Care | Payer: Medicare Other | Attending: Family Medicine | Admitting: Family Medicine

## 2020-01-09 ENCOUNTER — Other Ambulatory Visit: Payer: Self-pay

## 2020-01-09 DIAGNOSIS — J029 Acute pharyngitis, unspecified: Secondary | ICD-10-CM | POA: Diagnosis not present

## 2020-01-09 DIAGNOSIS — R0982 Postnasal drip: Secondary | ICD-10-CM | POA: Diagnosis not present

## 2020-01-09 DIAGNOSIS — J0100 Acute maxillary sinusitis, unspecified: Secondary | ICD-10-CM | POA: Diagnosis not present

## 2020-01-09 DIAGNOSIS — R059 Cough, unspecified: Secondary | ICD-10-CM

## 2020-01-09 MED ORDER — AMOXICILLIN-POT CLAVULANATE 875-125 MG PO TABS: 1.0000 | ORAL_TABLET | Freq: Two times a day (BID) | ORAL | 0 refills | Status: AC

## 2020-01-09 NOTE — ED Triage Notes (Addendum)
Sore throat at night that started Wednesday and cough that started last night. States she does not want covid/flu test

## 2020-01-09 NOTE — Discharge Instructions (Signed)
I have sent in Augmentin for you to take twice a day for 7 days.  Follow up with this office or with primary care if symptoms are persisting.  Follow up in the ER for high fever, trouble swallowing, trouble breathing, other concerning symptoms.  

## 2020-01-09 NOTE — ED Provider Notes (Signed)
[facility]   [id number] 01/09/20 Arrival Time: 1007  RJ:JOAC THROAT  SUBJECTIVE: History from: patient.  Joanne White is a 79 y.o. female who presents with abrupt onset of sore throat, sinus pain for the last week. Reports cough that started last night. Denies sick exposure to Covid, strep, flu or mono, or precipitating event. Has tried OTC cough and cold without relief. Has negative history of Covid. Has completed Covid vaccines. Has completed flu vaccine this year. Declines Covid/Flu testing today. Symptoms are made worse with swallowing, but tolerating liquids and own secretions without difficulty.  Denies previous symptoms in the past.     Denies fever, chills, fatigue, ear pain, rhinorrhea, nasal congestion, cough, SOB, wheezing, chest pain, nausea, rash, changes in bowel or bladder habits.     ROS: As per HPI.  All other pertinent ROS negative.     Past Medical History:  Diagnosis Date  . Abnormal weight loss   . Acute sinusitis, unspecified   . Allergy   . Anemia, unspecified   . Chronic kidney disease, stage 2 (mild)   . Disappearance and death of family member   . Dizziness and giddiness   . Essential (primary) hypertension   . HTN (hypertension), benign 03/03/2014  . Hyperkalemia   . Hypertension   . Hypertensive chronic kidney disease with stage 1 through stage 4 chronic kidney disease, or unspecified chronic kidney disease   . Pneumonia, unspecified organism   . Tinnitus, right ear   . Vertigo    Past Surgical History:  Procedure Laterality Date  . CHOLECYSTECTOMY  03/04/2014   Procedure: LAPAROSCOPIC CHOLECYSTECTOMY;  Surgeon: Romie Levee, MD;  Location: [facility];  Service: General;;  . NO PAST SURGERIES     Allergies  Allergen Reactions  . Codeine Nausea And Vomiting   No current facility-administered medications on file prior to encounter.   Current Outpatient Medications on File Prior to Encounter  Medication Sig Dispense Refill  .  amLODipine (NORVASC) 5 MG tablet Take 1 tablet (5 mg total) by mouth daily. 90 tablet 1  . meclizine (ANTIVERT) 12.5 MG tablet Take 1 tablet (12.5 mg total) by mouth 3 (three) times daily as needed for dizziness. 30 tablet 0  . meloxicam (MOBIC) 7.5 MG tablet Take 1 tablet (7.5 mg total) by mouth daily. 30 tablet 0   Social History   Socioeconomic History  . Marital status: Single    Spouse name: Not on file  . Number of children: Not on file  . Years of education: Not on file  . Highest education level: Not on file  Occupational History  . Not on file  Tobacco Use  . Smoking status: Former Smoker    Quit date: 03/03/2011    Years since quitting: 8.8  . Smokeless tobacco: Never Used  Substance and Sexual Activity  . Alcohol use: Yes    Comment: 1 beer once or twice a week and a rare mixed drink  . Drug use: No  . Sexual activity: Not on file  Other Topics Concern  . Not on file  Social History Narrative  . Not on file   Social Determinants of Health   Financial Resource Strain:   . Difficulty of Paying Living Expenses: Not on file  Food Insecurity:   . Worried About Programme researcher, broadcasting/film/video in the Last Year: Not on file  . Ran Out of Food in the Last Year: Not on file  Transportation Needs:   . Lack of Transportation (  Medical): Not on file  . Lack of Transportation (Non-Medical): Not on file  Physical Activity:   . Days of Exercise per Week: Not on file  . Minutes of Exercise per Session: Not on file  Stress:   . Feeling of Stress : Not on file  Social Connections:   . Frequency of Communication with Friends and Family: Not on file  . Frequency of Social Gatherings with Friends and Family: Not on file  . Attends Religious Services: Not on file  . Active Member of Clubs or Organizations: Not on file  . Attends Banker Meetings: Not on file  . Marital Status: Not on file  Intimate Partner Violence:   . Fear of Current or Ex-Partner: Not on file  . Emotionally  Abused: Not on file  . Physically Abused: Not on file  . Sexually Abused: Not on file   Family History  Problem Relation Age of Onset  . Hypertension Father   . Cancer Father        lung  . Heart attack Brother   . Hypertension Brother     OBJECTIVE:  Vitals:   01/09/20 1028 01/09/20 1029  BP: (!) 167/79   Pulse: 72   Resp: 18   Temp: 98.8 F (37.1 C)   TempSrc: Oral   SpO2: 96%   Weight:  165 lb 5.5 oz (75 kg)  Height:  5\' 4"  (1.626 m)     General appearance: alert; appears fatigued, but nontoxic, speaking in full sentences and managing own secretions HEENT: NCAT; Ears: EACs clear, TMs pearly gray with visible cone of light, without erythema; Eyes: PERRL, EOMI grossly; Nose: no obvious rhinorrhea; Throat: oropharynx clear, tonsils 1+ and mildly erythematous without white tonsillar exudates, uvula midline Neck: supple with LAD Lungs: CTA bilaterally without adventitious breath sounds; cough absent Heart: regular rate and rhythm.  Radial pulses 2+ symmetrical bilaterally Skin: warm and dry Psychological: alert and cooperative; normal mood and affect  LABS: No results found for this or any previous visit (from the past 24 hour(s)).   ASSESSMENT & PLAN:  1. Acute non-recurrent maxillary sinusitis   2. Cough   3. PND (post-nasal drip)   4. Sore throat     Meds ordered this encounter  Medications  . amoxicillin-clavulanate (AUGMENTIN) 875-125 MG tablet    Sig: Take 1 tablet by mouth 2 (two) times daily for 7 days.    Dispense:  14 tablet    Refill:  0    Order Specific Question:   Supervising Provider    Answer:   Merrilee Jansky    Prescribed Augmentin BID x 7 days Take as directed and to completion Get plenty of rest and push fluids Take OTC Zyrtec and use chloraseptic spray as needed for throat pain. Drink warm or cool liquids, use throat lozenges, or popsicles to help alleviate symptoms Take OTC ibuprofen or tylenol as needed for pain Follow  up with PCP if symptoms persists Return or go to ER if patient has any new or worsening symptoms such as fever, chills, nausea, vomiting, worsening sore throat, cough, abdominal pain, chest pain, changes in bowel or bladder habits  Reviewed expectations re: course of current medical issues. Questions answered. Outlined signs and symptoms indicating need for more acute intervention. Patient verbalized understanding. After Visit Summary given.          [id number], NP 01/11/20 1017

## 2020-02-27 ENCOUNTER — Encounter: Payer: Self-pay | Admitting: Nurse Practitioner

## 2020-02-27 ENCOUNTER — Other Ambulatory Visit: Payer: Self-pay

## 2020-02-27 ENCOUNTER — Ambulatory Visit (INDEPENDENT_AMBULATORY_CARE_PROVIDER_SITE_OTHER): Payer: Medicare Other | Admitting: Nurse Practitioner

## 2020-02-27 VITALS — BP 150/82 | HR 69 | Temp 98.2°F | Resp 18 | Ht 64.0 in | Wt 161.0 lb

## 2020-02-27 DIAGNOSIS — D649 Anemia, unspecified: Secondary | ICD-10-CM

## 2020-02-27 DIAGNOSIS — I10 Essential (primary) hypertension: Secondary | ICD-10-CM | POA: Diagnosis not present

## 2020-02-27 DIAGNOSIS — Z1159 Encounter for screening for other viral diseases: Secondary | ICD-10-CM

## 2020-02-27 DIAGNOSIS — Z7689 Persons encountering health services in other specified circumstances: Secondary | ICD-10-CM

## 2020-02-27 DIAGNOSIS — R7303 Prediabetes: Secondary | ICD-10-CM

## 2020-02-27 DIAGNOSIS — R42 Dizziness and giddiness: Secondary | ICD-10-CM

## 2020-02-27 DIAGNOSIS — Z91018 Allergy to other foods: Secondary | ICD-10-CM

## 2020-02-27 DIAGNOSIS — K8020 Calculus of gallbladder without cholecystitis without obstruction: Secondary | ICD-10-CM | POA: Diagnosis not present

## 2020-02-27 MED ORDER — MECLIZINE HCL 12.5 MG PO TABS: 12.5000 mg | ORAL_TABLET | Freq: Three times a day (TID) | ORAL | 0 refills | Status: DC | PRN

## 2020-02-27 NOTE — Patient Instructions (Signed)
You were seen today to establish care.  For alpha-gal allergy, we will get an alpha-gal panel today to see if you still have sensitivity to lamp, beef, and pork.  For routine labs, we will collect those in May.  I am glad to see you again, and I look forward to seeing you in May. We will get records from Dr. Scharlene Gloss office.

## 2020-02-27 NOTE — Assessment & Plan Note (Addendum)
-  hasn't eaten red meat in several years -has not had sensitivity testing in over a year -will draw alpha-gal panel today

## 2020-02-27 NOTE — Assessment & Plan Note (Signed)
-  status post cholecystectomy

## 2020-02-27 NOTE — Addendum Note (Signed)
Addended by: Dellia Cloud on: 02/27/2020 10:30 AM   Modules accepted: Orders

## 2020-02-27 NOTE — Progress Notes (Signed)
New Patient Office Visit  Subjective:  Patient ID: Joanne White, female    DOB: 05/29/1940  Age: 80 y.o. MRN: [id number]  CC:  Chief Complaint  Patient presents with  . New Patient (Initial Visit)    HPI Joanne White presents for new patient visit. Transferring care from Dr. Durene Cal. Last physical was at Dr. Durene Cal Last labs were drawn about 2 months ago. She was due for routine fasting labs, A1c, microalbumin, and TSH in May 2022.  No acute issues today.  Past Medical History:  Diagnosis Date  . Abnormal weight loss   . Acute sinusitis, unspecified   . Allergy    bananas, codeine; seasonal allergies-tree pollen  . Anemia, unspecified   . Chronic kidney disease, stage 2 (mild)   . Disappearance and death of family member   . Dizziness and giddiness   . Essential (primary) hypertension   . HTN (hypertension), benign 03/03/2014  . Hyperkalemia   . Hypertension   . Hypertensive chronic kidney disease with stage 1 through stage 4 chronic kidney disease, or unspecified chronic kidney disease   . Pneumonia, unspecified organism   . Tinnitus, right ear   . Vertigo     Past Surgical History:  Procedure Laterality Date  . CHOLECYSTECTOMY  03/04/2014   Procedure: LAPAROSCOPIC CHOLECYSTECTOMY;  Surgeon: Leighton Ruff, MD;  Location: [facility];  Service: General;;  . NO PAST SURGERIES      Family History  Problem Relation Age of Onset  . Hypertension Father   . Cancer Father        lung  . Heart attack Brother   . Hypertension Brother     Social History   Socioeconomic History  . Marital status: Single    Spouse name: Not on file  . Number of children: Not on file  . Years of education: Not on file  . Highest education level: Not on file  Occupational History  . Occupation: retired  Tobacco Use  . Smoking status: Former Smoker    Quit date: 03/03/2011    Years since quitting: 8.9  . Smokeless tobacco: Never Used  Vaping Use  . Vaping Use: Never used   Substance and Sexual Activity  . Alcohol use: Yes    Comment: 1 beer once or twice a week and a rare mixed drink  . Drug use: No  . Sexual activity: Not Currently  Other Topics Concern  . Not on file  Social History Narrative  . Not on file   Social Determinants of Health   Financial Resource Strain: Not on file  Food Insecurity: Not on file  Transportation Needs: Not on file  Physical Activity: Not on file  Stress: Not on file  Social Connections: Not on file  Intimate Partner Violence: Not on file    ROS Review of Systems  Constitutional: Negative.   Respiratory: Negative.   Cardiovascular: Negative.   Psychiatric/Behavioral: Negative.     Objective:   Today's Vitals: BP (!) 150/82   Pulse 69   Temp 98.2 F (36.8 C)   Resp 18   Ht '5\' 4"'  (1.626 m)   Wt 161 lb (73 kg)   SpO2 97%   BMI 27.64 kg/m   Physical Exam Constitutional:      Appearance: Normal appearance.  Cardiovascular:     Rate and Rhythm: Normal rate and regular rhythm.     Pulses: Normal pulses.     Heart sounds: Normal heart sounds.  Pulmonary:  Effort: Pulmonary effort is normal.     Breath sounds: Normal breath sounds.  Neurological:     Mental Status: She is alert.  Psychiatric:        Mood and Affect: Mood normal.        Behavior: Behavior normal.        Thought Content: Thought content normal.        Judgment: Judgment normal.     Assessment & Plan:   Problem List Items Addressed This Visit      Cardiovascular and Mediastinum   HTN (hypertension), benign (Chronic)    -BP 150/82 -takes amlodipine 5 mg daily -used ARBs in the past and they caused hyperkalemia      Relevant Orders   CBC with Differential/Platelet   CMP14+EGFR     Digestive   Cholelithiasis    -status post cholecystectomy        Other   Allergy to alpha-gal    -hasn't eaten red meat in several years -has not had sensitivity testing in over a year -will draw alpha-gal panel today      Relevant  Orders   Alpha-Gal Panel   Vertigo    -no acute issues today; needs refill on meclizine -refilled       Other Visit Diagnoses    Encounter to establish care    -  Primary   Relevant Orders   CBC with Differential/Platelet   CMP14+EGFR   Hemoglobin A1c   HCV Ab w/Rflx to Verification   Lipid Panel With LDL/HDL Ratio   Prediabetes       Relevant Orders   Hemoglobin A1c   Lipid Panel With LDL/HDL Ratio   Urine Microalbumin w/creat. ratio      Outpatient Encounter Medications as of 02/27/2020  Medication Sig  . amLODipine (NORVASC) 5 MG tablet Take 1 tablet (5 mg total) by mouth daily.  . meloxicam (MOBIC) 7.5 MG tablet Take 1 tablet (7.5 mg total) by mouth daily.  . [DISCONTINUED] meclizine (ANTIVERT) 12.5 MG tablet Take 1 tablet (12.5 mg total) by mouth 3 (three) times daily as needed for dizziness.  . meclizine (ANTIVERT) 12.5 MG tablet Take 1 tablet (12.5 mg total) by mouth 3 (three) times daily as needed for dizziness.   No facility-administered encounter medications on file as of 02/27/2020.    Follow-up: Return in about 4 months (around 06/26/2020) for Lab follow-up.   Noreene Larsson, NP

## 2020-02-27 NOTE — Assessment & Plan Note (Signed)
-  no acute issues today; needs refill on meclizine -refilled

## 2020-02-27 NOTE — Assessment & Plan Note (Addendum)
-  BP 150/82 -takes amlodipine 5 mg daily -used ARBs in the past and they caused hyperkalemia

## 2020-03-05 LAB — ALPHA-GAL PANEL
Allergen Lamb IgE: 0.12 kU/L — AB
Beef IgE: 0.3 kU/L — AB
IgE (Immunoglobulin E), Serum: 28 IU/mL (ref 6–495)
O215-IgE Alpha-Gal: 0.39 kU/L — AB
Pork IgE: <0.1 kU/L

## 2020-03-05 NOTE — Progress Notes (Signed)
The alpha-gal panel came back. It shows that the Pork IgE levels were negative, so it is likely safe to resume eating pork.  Beef and lamb were in the "equivocal/low" sensitivity range, so you may still have some sensitivity to beef and lamb. The lamb value is very close to the negative range, but beef is closer to the low range and you are more likely to have an issue with eating beef although the likelihood is still low based on these findings.

## 2020-03-05 NOTE — Progress Notes (Signed)
If you do try to restart beef or lamb, make sure you have an epi-pen and/or benadryl nearby in case you have a reaction.  IgE is the substance that causes severe allergic reactions and your IgE levels in response to beef and lamb were low.

## 2020-03-11 ENCOUNTER — Other Ambulatory Visit: Payer: Self-pay

## 2020-03-11 ENCOUNTER — Telehealth (INDEPENDENT_AMBULATORY_CARE_PROVIDER_SITE_OTHER): Payer: Medicare Other

## 2020-03-11 VITALS — Ht 64.0 in | Wt 159.0 lb

## 2020-03-11 DIAGNOSIS — Z0000 Encounter for general adult medical examination without abnormal findings: Secondary | ICD-10-CM | POA: Diagnosis not present

## 2020-03-11 NOTE — Progress Notes (Signed)
Subjective:   Joanne White is a 80 y.o. female who presents for Medicare Annual (Subsequent) preventive examination.  Review of Systems       Objective:    There were no vitals filed for this visit. There is no height or weight on file to calculate BMI.  Advanced Directives 08/05/2014 03/03/2014 03/02/2014 03/02/2014 02/24/2014  Does Patient Have a Medical Advance Directive? No No No No No  Would patient like information on creating a medical advance directive? - No - patient declined information No - patient declined information - No - patient declined information    Current Medications (verified) Outpatient Encounter Medications as of 03/11/2020  Medication Sig  . amLODipine (NORVASC) 5 MG tablet Take 1 tablet (5 mg total) by mouth daily.  . meclizine (ANTIVERT) 12.5 MG tablet Take 1 tablet (12.5 mg total) by mouth 3 (three) times daily as needed for dizziness.  . meloxicam (MOBIC) 7.5 MG tablet Take 1 tablet (7.5 mg total) by mouth daily.   No facility-administered encounter medications on file as of 03/11/2020.    Allergies (verified) Banana and Codeine   History: Past Medical History:  Diagnosis Date  . Abnormal weight loss   . Acute sinusitis, unspecified   . Allergy    bananas, codeine; seasonal allergies-tree pollen  . Anemia, unspecified   . Chronic kidney disease, stage 2 (mild)   . Disappearance and death of family member   . Dizziness and giddiness   . Essential (primary) hypertension   . HTN (hypertension), benign 03/03/2014  . Hyperkalemia   . Hypertension   . Hypertensive chronic kidney disease with stage 1 through stage 4 chronic kidney disease, or unspecified chronic kidney disease   . Pneumonia, unspecified organism   . Tinnitus, right ear   . Vertigo    Past Surgical History:  Procedure Laterality Date  . CHOLECYSTECTOMY  03/04/2014   Procedure: LAPAROSCOPIC CHOLECYSTECTOMY;  Surgeon: Romie Levee, MD;  Location: [facility];  Service: General;;  .  NO PAST SURGERIES     Family History  Problem Relation Age of Onset  . Hypertension Father   . Cancer Father        lung  . Heart attack Brother   . Hypertension Brother    Social History   Socioeconomic History  . Marital status: Single    Spouse name: Not on file  . Number of children: Not on file  . Years of education: Not on file  . Highest education level: Not on file  Occupational History  . Occupation: retired  Tobacco Use  . Smoking status: Former Smoker    Quit date: 03/03/2011    Years since quitting: 9.0  . Smokeless tobacco: Never Used  Vaping Use  . Vaping Use: Never used  Substance and Sexual Activity  . Alcohol use: Yes    Comment: 1 beer once or twice a week and a rare mixed drink  . Drug use: No  . Sexual activity: Not Currently  Other Topics Concern  . Not on file  Social History Narrative  . Not on file   Social Determinants of Health   Financial Resource Strain: Not on file  Food Insecurity: Not on file  Transportation Needs: Not on file  Physical Activity: Not on file  Stress: Not on file  Social Connections: Not on file    Tobacco Counseling Counseling given: Not Answered   Clinical Intake:  Diabetic? no         Activities of Daily Living No flowsheet data found.  Patient Care Team: Heather Roberts, NP as PCP - General (Nurse Practitioner)  Indicate any recent Medical Services you may have received from other than [facility] providers in the past year (date may be approximate).     Assessment:   This is a routine wellness examination for Joanne White.  Hearing/Vision screen No exam data present  Dietary issues and exercise activities discussed:    Goals   None    Depression Screen PHQ 2/9 Scores 02/27/2020 04/14/2019  PHQ - 2 Score 0 0    Fall Risk Fall Risk  02/27/2020 04/14/2019  Falls in the past year? 0 0  Number falls in past yr: 0 0  Injury with Fall? 0 0  Risk for fall due to : No Fall Risks -   Follow up Falls evaluation completed Falls evaluation completed    FALL RISK PREVENTION PERTAINING TO THE HOME:  Any stairs in or around the home? Yes  If so, are there any without handrails? No  Home free of loose throw rugs in walkways, pet beds, electrical cords, etc? Yes  Adequate lighting in your home to reduce risk of falls? Yes   ASSISTIVE DEVICES UTILIZED TO PREVENT FALLS:  Life alert? No  Use of a cane, [place] or w/c? No  Grab bars in the bathroom? Yes  Shower chair or bench in shower? No  Elevated toilet seat or a handicapped toilet? No   TIMED UP AND GO:  Was the test performed? No .  Length of time to ambulate n/a     Cognitive Function:        Immunizations Immunization History  Administered Date(s) Administered  . Influenza, High Dose Seasonal PF 11/08/2015, 11/06/2016, 11/01/2017  . Influenza,inj,Quad PF,6+ Mos 10/16/2018  . Influenza-Unspecified 10/28/2014, 12/15/2019  . Moderna Sars-Covid-2 Vaccination 03/07/2019, 04/09/2019, 12/06/2019    TDAP status: Due, Education has been provided regarding the importance of this vaccine. Advised may receive this vaccine at local pharmacy or Health Dept. Aware to provide a copy of the vaccination record if obtained from local pharmacy or Health Dept. Verbalized acceptance and understanding.  Flu Vaccine status: Up to date  Pneumococcal vaccine status: Declined,  Education has been provided regarding the importance of this vaccine but patient still declined. Advised may receive this vaccine at local pharmacy or Health Dept. Aware to provide a copy of the vaccination record if obtained from local pharmacy or Health Dept. Verbalized acceptance and understanding.   Covid-19 vaccine status: Completed vaccines  Qualifies for Shingles Vaccine? Yes   Zostavax completed No   Shingrix Completed?: No.    Education has been provided regarding the importance of this vaccine. Patient has been advised to call insurance company  to determine out of pocket expense if they have not yet received this vaccine. Advised may also receive vaccine at local pharmacy or Health Dept. Verbalized acceptance and understanding.  Screening Tests Health Maintenance  Topic Date Due  . Hepatitis C Screening  Never done  . TETANUS/TDAP  Never done  . PNA vac Low Risk Adult (1 of 2 - PCV13) Never done  . DEXA SCAN  02/26/2021 (Originally 07/25/2006)  . INFLUENZA VACCINE  Completed  . COVID-19 Vaccine  Completed    Health Maintenance  Health Maintenance Due  Topic Date Due  . Hepatitis C Screening  Never done  . TETANUS/TDAP  Never done  . PNA vac Low  Risk Adult (1 of 2 - PCV13) Never done    Colorectal Screening: Declined at this time  Mammogram: Declined at this time  Bone Density: Declined at this time  Lung Cancer Screening: (Low Dose CT Chest recommended if Age 81-80 years, 30 pack-year currently smoking OR have quit w/in 15years.) does not qualify.   Lung Cancer Screening Referral: n/a  Additional Screening:  Hepatitis C Screening: does not qualify; Completed   Vision Screening: Recommended annual ophthalmology exams for early detection of glaucoma and other disorders of the eye. Is the patient up to date with their annual eye exam?  Yes  Who is the provider or what is the name of the office in which the patient attends annual eye exams? [facility] If pt is not established with a provider, would they like to be referred to a provider to establish care? No .   Dental Screening: Recommended annual dental exams for proper oral hygiene  Community Resource Referral / Chronic Care Management: CRR required this visit?  No   CCM required this visit?  No      Plan:     I have personally reviewed and noted the following in the patient's chart:   . Medical and social history . Use of alcohol, tobacco or illicit drugs  . Current medications and supplements . Functional ability and status . Nutritional  status . Physical activity . Advanced directives . List of other physicians . Hospitalizations, surgeries, and ER visits in previous 12 months . Vitals . Screenings to include cognitive, depression, and falls . Referrals and appointments  In addition, I have reviewed and discussed with patient certain preventive protocols, quality metrics, and best practice recommendations. A written personalized care plan for preventive services as well as general preventive health recommendations were provided to patient.     Jerilynn Mages, [place]   2/70/3500   Nurse Notes: AWV conducted by nurse in office by phone. Patient gave consent to telehealth visit via audio. Patient at home at the time of this visit. Provider in office at the time of this visit. Visit took 30 minutes to complete.

## 2020-03-11 NOTE — Patient Instructions (Signed)
Joanne White , Thank you for taking time to come for your Medicare Wellness Visit. I appreciate your ongoing commitment to your health goals. Please review the following plan we discussed and let me know if I can assist you in the future.   Screening recommendations/referrals: Colonoscopy: Declined at this time. Mammogram: Declined at this time. Bone Density: Complete Recommended yearly ophthalmology/optometry visit for glaucoma screening and checkup Recommended yearly dental visit for hygiene and checkup  Vaccinations: Influenza vaccine: Fall 2022 Pneumococcal vaccine: Declined at this time. Tdap vaccine: Declined at this time. Shingles vaccine: Declined at this time.  Advanced directives: Yes  Conditions/risks identified: None  Next appointment: 06/28/20 @ 10 am   Preventive Care 65 Years and Older, Female Preventive care refers to lifestyle choices and visits with your health care provider that can promote health and wellness. What does preventive care include?  A yearly physical exam. This is also called an annual well check.  Dental exams once or twice a year.  Routine eye exams. Ask your health care provider how often you should have your eyes checked.  Personal lifestyle choices, including:  Daily care of your teeth and gums.  Regular physical activity.  Eating a healthy diet.  Avoiding tobacco and drug use.  Limiting alcohol use.  Practicing safe sex.  Taking low-dose aspirin every day.  Taking vitamin and mineral supplements as recommended by your health care provider. What happens during an annual well check? The services and screenings done by your health care provider during your annual well check will depend on your age, overall health, lifestyle risk factors, and family history of disease. Counseling  Your health care provider may ask you questions about your:  Alcohol use.  Tobacco use.  Drug use.  Emotional well-being.  Home and relationship  well-being.  Sexual activity.  Eating habits.  History of falls.  Memory and ability to understand (cognition).  Work and work Astronomer.  Reproductive health. Screening  You may have the following tests or measurements:  Height, weight, and BMI.  Blood pressure.  Lipid and cholesterol levels. These may be checked every 5 years, or more frequently if you are over 49 years old.  Skin check.  Lung cancer screening. You may have this screening every year starting at age 30 if you have a 30-pack-year history of smoking and currently smoke or have quit within the past 15 years.  Fecal occult blood test (FOBT) of the stool. You may have this test every year starting at age 25.  Flexible sigmoidoscopy or colonoscopy. You may have a sigmoidoscopy every 5 years or a colonoscopy every 10 years starting at age 73.  Hepatitis C blood test.  Hepatitis B blood test.  Sexually transmitted disease (STD) testing.  Diabetes screening. This is done by checking your blood sugar (glucose) after you have not eaten for a while (fasting). You may have this done every 1-3 years.  Bone density scan. This is done to screen for osteoporosis. You may have this done starting at age 67.  Mammogram. This may be done every 1-2 years. Talk to your health care provider about how often you should have regular mammograms. Talk with your health care provider about your test results, treatment options, and if necessary, the need for more tests. Vaccines  Your health care provider may recommend certain vaccines, such as:  Influenza vaccine. This is recommended every year.  Tetanus, diphtheria, and acellular pertussis (Tdap, Td) vaccine. You may need a Td booster every 10 years.  Zoster vaccine. You may need this after age 56.  Pneumococcal 13-valent conjugate (PCV13) vaccine. One dose is recommended after age 76.  Pneumococcal polysaccharide (PPSV23) vaccine. One dose is recommended after age  43. Talk to your health care provider about which screenings and vaccines you need and how often you need them. This information is not intended to replace advice given to you by your health care provider. Make sure you discuss any questions you have with your health care provider. Document Released: 02/26/2015 Document Revised: 10/20/2015 Document Reviewed: 12/01/2014 Elsevier Interactive Patient Education  2017 [employer] Prevention in the Home Falls can cause injuries. They can happen to people of all ages. There are many things you can do to make your home safe and to help prevent falls. What can I do on the outside of my home?  Regularly fix the edges of walkways and driveways and fix any cracks.  Remove anything that might make you trip as you walk through a door, such as a raised step or threshold.  Trim any bushes or trees on the path to your home.  Use bright outdoor lighting.  Clear any walking paths of anything that might make someone trip, such as rocks or tools.  Regularly check to see if handrails are loose or broken. Make sure that both sides of any steps have handrails.  Any raised decks and porches should have guardrails on the edges.  Have any leaves, snow, or ice cleared regularly.  Use sand or salt on walking paths during winter.  Clean up any spills in your garage right away. This includes oil or grease spills. What can I do in the bathroom?  Use night lights.  Install grab bars by the toilet and in the tub and shower. Do not use towel bars as grab bars.  Use non-skid mats or decals in the tub or shower.  If you need to sit down in the shower, use a plastic, non-slip stool.  Keep the floor dry. Clean up any water that spills on the floor as soon as it happens.  Remove soap buildup in the tub or shower regularly.  Attach bath mats securely with double-sided non-slip rug tape.  Do not have throw rugs and other things on the floor that can make  you trip. What can I do in the bedroom?  Use night lights.  Make sure that you have a light by your bed that is easy to reach.  Do not use any sheets or blankets that are too big for your bed. They should not hang down onto the floor.  Have a firm chair that has side arms. You can use this for support while you get dressed.  Do not have throw rugs and other things on the floor that can make you trip. What can I do in the kitchen?  Clean up any spills right away.  Avoid walking on wet floors.  Keep items that you use a lot in easy-to-reach places.  If you need to reach something above you, use a strong step stool that has a grab bar.  Keep electrical cords out of the way.  Do not use floor polish or wax that makes floors slippery. If you must use wax, use non-skid floor wax.  Do not have throw rugs and other things on the floor that can make you trip. What can I do with my stairs?  Do not leave any items on the stairs.  Make sure that there are  handrails on both sides of the stairs and use them. Fix handrails that are broken or loose. Make sure that handrails are as long as the stairways.  Check any carpeting to make sure that it is firmly attached to the stairs. Fix any carpet that is loose or worn.  Avoid having throw rugs at the top or bottom of the stairs. If you do have throw rugs, attach them to the floor with carpet tape.  Make sure that you have a light switch at the top of the stairs and the bottom of the stairs. If you do not have them, ask someone to add them for you. What else can I do to help prevent falls?  Wear shoes that:  Do not have high heels.  Have rubber bottoms.  Are comfortable and fit you well.  Are closed at the toe. Do not wear sandals.  If you use a stepladder:  Make sure that it is fully opened. Do not climb a closed stepladder.  Make sure that both sides of the stepladder are locked into place.  Ask someone to hold it for you, if  possible.  Clearly mark and make sure that you can see:  Any grab bars or handrails.  First and last steps.  Where the edge of each step is.  Use tools that help you move around (mobility aids) if they are needed. These include:  Canes.  Walkers.  Scooters.  Crutches.  Turn on the lights when you go into a dark area. Replace any light bulbs as soon as they burn out.  Set up your furniture so you have a clear path. Avoid moving your furniture around.  If any of your floors are uneven, fix them.  If there are any pets around you, be aware of where they are.  Review your medicines with your doctor. Some medicines can make you feel dizzy. This can increase your chance of falling. Ask your doctor what other things that you can do to help prevent falls. This information is not intended to replace advice given to you by your health care provider. Make sure you discuss any questions you have with your health care provider. Document Released: 11/26/2008 Document Revised: 07/08/2015 Document Reviewed: 03/06/2014 Elsevier Interactive Patient Education  2017 [employer].

## 2020-04-12 DIAGNOSIS — M722 Plantar fascial fibromatosis: Secondary | ICD-10-CM | POA: Diagnosis not present

## 2020-04-12 DIAGNOSIS — M7731 Calcaneal spur, right foot: Secondary | ICD-10-CM | POA: Diagnosis not present

## 2020-04-12 DIAGNOSIS — M79671 Pain in right foot: Secondary | ICD-10-CM | POA: Diagnosis not present

## 2020-05-03 DIAGNOSIS — M722 Plantar fascial fibromatosis: Secondary | ICD-10-CM | POA: Diagnosis not present

## 2020-05-03 DIAGNOSIS — M79671 Pain in right foot: Secondary | ICD-10-CM | POA: Diagnosis not present

## 2020-05-03 DIAGNOSIS — M7731 Calcaneal spur, right foot: Secondary | ICD-10-CM | POA: Diagnosis not present

## 2020-06-15 ENCOUNTER — Ambulatory Visit (INDEPENDENT_AMBULATORY_CARE_PROVIDER_SITE_OTHER): Payer: Medicare Other

## 2020-06-15 ENCOUNTER — Ambulatory Visit
Admission: EM | Admit: 2020-06-15 | Discharge: 2020-06-15 | Disposition: A | Discharge status: Home / Self Care | Payer: Medicare Other | Attending: Family Medicine | Admitting: Family Medicine

## 2020-06-15 ENCOUNTER — Other Ambulatory Visit: Payer: Self-pay

## 2020-06-15 ENCOUNTER — Encounter: Payer: Self-pay | Admitting: Emergency Medicine

## 2020-06-15 DIAGNOSIS — W010XXA Fall on same level from slipping, tripping and stumbling without subsequent striking against object, initial encounter: Secondary | ICD-10-CM

## 2020-06-15 DIAGNOSIS — M79672 Pain in left foot: Secondary | ICD-10-CM | POA: Diagnosis not present

## 2020-06-15 DIAGNOSIS — I10 Essential (primary) hypertension: Secondary | ICD-10-CM

## 2020-06-15 NOTE — Discharge Instructions (Addendum)
No broken bones were seen on your x-rays today.  Your blood pressure was noted to be elevated during your visit today. If you are currently taking medication for high blood pressure, please ensure you are taking this as directed. If you do not have a history of high blood pressure and your blood pressure remains persistently elevated, you may need to begin taking a medication at some point. You may return here within the next few days to recheck if unable to see your primary care provider or if you do not have a one.  BP (!) 187/81 (BP Location: Left Arm)   Pulse 71   Temp 98.2 F (36.8 C) (Oral)   Resp 17   SpO2 98%   BP Readings from Last 3 Encounters:  06/15/20 (!) 187/81  02/27/20 (!) 150/82  01/09/20 (!) 167/79

## 2020-06-15 NOTE — ED Triage Notes (Signed)
Got pulled down by her dog this morning. Pain to top of LT foot and had some loss of feeling to foot immediately after incident but that has gotten better.

## 2020-06-15 NOTE — ED Provider Notes (Signed)
[facility]   [id number] 06/15/20 Arrival Time: 1147  ASSESSMENT & PLAN:  1. Left foot pain   2. Elevated blood pressure reading in office with diagnosis of hypertension     I have personally viewed the imaging studies ordered this visit. No fractures appreciated.  Having trouble with weight bearing secondary to foot pain. Fitted for CAM boot. Able to bear weight well while wearing. Prefers OTC Tylenol as needed.    Discharge Instructions      No broken bones were seen on your x-rays today.  Your blood pressure was noted to be elevated during your visit today. If you are currently taking medication for high blood pressure, please ensure you are taking this as directed. If you do not have a history of high blood pressure and your blood pressure remains persistently elevated, you may need to begin taking a medication at some point. You may return here within the next few days to recheck if unable to see your primary care provider or if you do not have a one.  BP (!) 187/81 (BP Location: Left Arm)   Pulse 71   Temp 98.2 F (36.8 C) (Oral)   Resp 17   SpO2 98%   BP Readings from Last 3 Encounters:  06/15/20 (!) 187/81  02/27/20 (!) 150/82  01/09/20 (!) 167/79        Recommend:  Follow-up Information    Heather Roberts, NP.   Specialty: Nurse Practitioner Why: To recheck your blood pressure. Contact information: [address]  [address] [place] [address] [phone]        [facility] [place].   Specialty: Urgent Care Why: If your foot does not continue to improve. Contact information: [address], [address] [place] [address] [phone]               Reviewed expectations re: course of current medical issues. Questions answered. Outlined signs and symptoms indicating need for more acute intervention. Patient verbalized understanding. After Visit Summary  given.  SUBJECTIVE: History from: patient. Joanne White is a 80 y.o. female who reports fairly persistent moderate pain of her left dorsal foot; described as aching; without radiation. Onset: abrupt. First noted: today. Injury/trama: reports begin pulled over by her dog; 'think I twisted my foot'; immed pain; unable to initially bear any weight but able to do so not but with pain. Extremity sensation changes or weakness: none. Self treatment: has not tried OTC therapies PTA.  Past Surgical History:  Procedure Laterality Date  . CHOLECYSTECTOMY  03/04/2014   Procedure: LAPAROSCOPIC CHOLECYSTECTOMY;  Surgeon: Romie Levee, MD;  Location: [facility];  Service: General;;  . NO PAST SURGERIES      Increased blood pressure noted today. Reports that she is treated for HTN. She reports no chest pain on exertion, no dyspnea on exertion, no swelling of ankles, no orthostatic dizziness or lightheadedness, no orthopnea or paroxysmal nocturnal dyspnea and no palpitations.   OBJECTIVE:  Vitals:   06/15/20 1204  BP: (!) 187/81  Pulse: 71  Resp: 17  Temp: 98.2 F (36.8 C)  TempSrc: Oral  SpO2: 98%    General appearance: alert; no distress HEENT: [place]; AT Neck: supple with FROM Resp: unlabored respirations Extremities: . LLE: warm with well perfused appearance; poorly localized moderate tenderness over left dorsal foot; without gross deformities; swelling: minimal; bruising: none; ankle and all toes with FROM. CV: brisk extremity capillary refill of LLE; 2+ DP pulse of  LLE. Skin: warm and dry; no visible rashes Neurologic: normal sensation and strength of LLE Psychological: alert and cooperative; normal mood and affect  Imaging: DG Foot Complete Left  Result Date: 06/15/2020 CLINICAL DATA:  Medial foot pain.  Tripped today. EXAM: LEFT FOOT - COMPLETE 3+ VIEW COMPARISON:  04/14/2019. FINDINGS: Diffuse osteopenia again noted. No acute or focal bony or joint abnormality. No evidence of fracture  or dislocation. No radiopaque foreign body. IMPRESSION: Diffuse osteopenia.  No acute abnormality. Electronically Signed   By: Maisie Fus  Register   On: 06/15/2020 12:36      Allergies  Allergen Reactions  . Banana     Stomach cramping and pain  . Codeine Nausea And Vomiting    Past Medical History:  Diagnosis Date  . Abnormal weight loss   . Acute sinusitis, unspecified   . Allergy    bananas, codeine; seasonal allergies-tree pollen  . Anemia, unspecified   . Chronic kidney disease, stage 2 (mild)   . Disappearance and death of family member   . Dizziness and giddiness   . Essential (primary) hypertension   . HTN (hypertension), benign 03/03/2014  . Hyperkalemia   . Hypertension   . Hypertensive chronic kidney disease with stage 1 through stage 4 chronic kidney disease, or unspecified chronic kidney disease   . Pneumonia, unspecified organism   . Tinnitus, right ear   . Vertigo    Social History   Socioeconomic History  . Marital status: Single    Spouse name: Not on file  . Number of children: Not on file  . Years of education: Not on file  . Highest education level: Not on file  Occupational History  . Occupation: retired  Tobacco Use  . Smoking status: Former Smoker    Quit date: 03/03/2011    Years since quitting: 9.2  . Smokeless tobacco: Never Used  Vaping Use  . Vaping Use: Never used  Substance and Sexual Activity  . Alcohol use: Yes    Comment: 1 beer once or twice a week and a rare mixed drink  . Drug use: No  . Sexual activity: Not Currently  Other Topics Concern  . Not on file  Social History Narrative  . Not on file   Social Determinants of Health   Financial Resource Strain: Low Risk   . Difficulty of Paying Living Expenses: Not hard at all  Food Insecurity: No Food Insecurity  . Worried About Programme researcher, broadcasting/film/video in the Last Year: Never true  . Ran Out of Food in the Last Year: Never true  Transportation Needs: No Transportation Needs  .  Lack of Transportation (Medical): No  . Lack of Transportation (Non-Medical): No  Physical Activity: Sufficiently Active  . Days of Exercise per Week: 7 days  . Minutes of Exercise per Session: 60 min  Stress: No Stress Concern Present  . Feeling of Stress : Not at all  Social Connections: Socially Isolated  . Frequency of Communication with Friends and Family: More than three times a week  . Frequency of Social Gatherings with Friends and Family: More than three times a week  . Attends Religious Services: Never  . Active Member of Clubs or Organizations: No  . Attends Banker Meetings: Never  . Marital Status: Never married   Family History  Problem Relation Age of Onset  . Hypertension Father   . Cancer Father        lung  . Heart attack Brother   .  Hypertension Brother    Past Surgical History:  Procedure Laterality Date  . CHOLECYSTECTOMY  03/04/2014   Procedure: LAPAROSCOPIC CHOLECYSTECTOMY;  Surgeon: Romie Levee, MD;  Location: [facility];  Service: General;;  . NO PAST SURGERIES        Mardella Layman, MD 06/15/20 1358

## 2020-06-21 ENCOUNTER — Other Ambulatory Visit: Payer: Self-pay

## 2020-06-21 ENCOUNTER — Telehealth: Payer: Self-pay

## 2020-06-21 DIAGNOSIS — I10 Essential (primary) hypertension: Secondary | ICD-10-CM

## 2020-06-21 MED ORDER — AMLODIPINE BESYLATE 5 MG PO TABS: 5.0000 mg | ORAL_TABLET | Freq: Every day | ORAL | 1 refills | Status: DC

## 2020-06-21 NOTE — Telephone Encounter (Signed)
Patient called needing refills on her blood pressure medication she states it was last filled thru dr halls office but she would like a 90 day supply if possible ph# [phone]

## 2020-06-21 NOTE — Telephone Encounter (Signed)
Rx refilled.

## 2020-06-23 NOTE — Progress Notes (Signed)
Labs look good. We will discuss them on 5/16.

## 2020-06-24 LAB — CMP14+EGFR
ALT: 17 IU/L (ref 0–32)
AST: 18 IU/L (ref 0–40)
Albumin/Globulin Ratio: 2 (ref 1.2–2.2)
Albumin: 4.5 g/dL (ref 3.7–4.7)
Alkaline Phosphatase: 110 IU/L (ref 44–121)
BUN/Creatinine Ratio: 13 (ref 12–28)
BUN: 13 mg/dL (ref 8–27)
Bilirubin Total: 0.5 mg/dL (ref 0.0–1.2)
CO2: 24 mmol/L (ref 20–29)
Calcium: 9.5 mg/dL (ref 8.7–10.3)
Chloride: 101 mmol/L (ref 96–106)
Creatinine, Ser: 0.97 mg/dL (ref 0.57–1.00)
Globulin, Total: 2.2 g/dL (ref 1.5–4.5)
Glucose: 92 mg/dL (ref 65–99)
Potassium: 4.9 mmol/L (ref 3.5–5.2)
Sodium: 139 mmol/L (ref 134–144)
Total Protein: 6.7 g/dL (ref 6.0–8.5)
eGFR: 60 mL/min/{1.73_m2} (ref >59)

## 2020-06-24 LAB — LIPID PANEL
Chol/HDL Ratio: 2.8 ratio (ref 0.0–4.4)
Cholesterol, Total: 199 mg/dL (ref 100–199)
HDL: 72 mg/dL (ref >39)
LDL Chol Calc (NIH): 104 mg/dL — ABNORMAL HIGH (ref 0–99)
Triglycerides: 133 mg/dL (ref 0–149)
VLDL Cholesterol Cal: 23 mg/dL (ref 5–40)

## 2020-06-24 LAB — CBC
Hematocrit: 44 % (ref 34.0–46.6)
Hemoglobin: 14.9 g/dL (ref 11.1–15.9)
MCH: 33.6 pg — ABNORMAL HIGH (ref 26.6–33.0)
MCHC: 33.9 g/dL (ref 31.5–35.7)
MCV: 99 fL — ABNORMAL HIGH (ref 79–97)
Platelets: 286 10*3/uL (ref 150–450)
RBC: 4.44 x10E6/uL (ref 3.77–5.28)
RDW: 12.3 % (ref 11.7–15.4)
WBC: 6.4 10*3/uL (ref 3.4–10.8)

## 2020-06-24 LAB — HEPATITIS C ANTIBODY: Hep C Virus Ab: <0.1 s/co ratio (ref 0.0–0.9)

## 2020-06-24 LAB — HEMOGLOBIN A1C
Est. average glucose Bld gHb Est-mCnc: 111 mg/dL
Hgb A1c MFr Bld: 5.5 % (ref 4.8–5.6)

## 2020-06-24 LAB — MICROALBUMIN / CREATININE URINE RATIO
Creatinine, Urine: 112.1 mg/dL
Microalb/Creat Ratio: 17 mg/g creat (ref 0–29)
Microalbumin, Urine: 18.6 ug/mL

## 2020-06-28 ENCOUNTER — Ambulatory Visit: Payer: Medicare Other | Admitting: Nurse Practitioner

## 2020-07-06 ENCOUNTER — Other Ambulatory Visit: Payer: Self-pay

## 2020-07-06 ENCOUNTER — Encounter: Payer: Self-pay | Admitting: Nurse Practitioner

## 2020-07-06 ENCOUNTER — Ambulatory Visit (INDEPENDENT_AMBULATORY_CARE_PROVIDER_SITE_OTHER): Payer: Medicare Other | Admitting: Nurse Practitioner

## 2020-07-06 DIAGNOSIS — M79672 Pain in left foot: Secondary | ICD-10-CM

## 2020-07-06 DIAGNOSIS — N182 Chronic kidney disease, stage 2 (mild): Secondary | ICD-10-CM | POA: Diagnosis not present

## 2020-07-06 DIAGNOSIS — I10 Essential (primary) hypertension: Secondary | ICD-10-CM | POA: Diagnosis not present

## 2020-07-06 NOTE — Assessment & Plan Note (Signed)
-  BP borderline today -no change in BP meds since home readings have been good

## 2020-07-06 NOTE — Assessment & Plan Note (Addendum)
-  she states that she has been treated at [facility] for left foot pain and that she had a fracture -no longer in boot, and doing better -records requested

## 2020-07-06 NOTE — Progress Notes (Signed)
Established Patient Office Visit  Subjective:  Patient ID: Joanne White, female    DOB: 1940/12/02  Age: 80 y.o. MRN: [id number]  CC:  Chief Complaint  Patient presents with  . Hypertension    Go over labs     HPI Joanne White presents for lab follow-up.  She has been seeing Dr. Caryl Comes for foot pain- plantar fasciitis and bone spur, but that is feeling better.  She went to [facility] for fall with left ankle sprain.  She went to [facility] for 2nd opinion, and she is out of the boot now.  She has been checking her BP at home, and she states that it has been great.  Past Medical History:  Diagnosis Date  . Abnormal weight loss   . Acute sinusitis, unspecified   . Allergy    bananas, codeine; seasonal allergies-tree pollen  . Anemia, unspecified   . Chronic kidney disease, stage 2 (mild)   . Disappearance and death of family member   . Dizziness and giddiness   . Essential (primary) hypertension   . HTN (hypertension), benign 03/03/2014  . Hyperkalemia   . Hypertension   . Hypertensive chronic kidney disease with stage 1 through stage 4 chronic kidney disease, or unspecified chronic kidney disease   . Pneumonia, unspecified organism   . Tinnitus, right ear   . Vertigo     Past Surgical History:  Procedure Laterality Date  . CHOLECYSTECTOMY  03/04/2014   Procedure: LAPAROSCOPIC CHOLECYSTECTOMY;  Surgeon: Leighton Ruff, MD;  Location: [facility];  Service: General;;  . NO PAST SURGERIES      Family History  Problem Relation Age of Onset  . Hypertension Father   . Cancer Father        lung  . Heart attack Brother   . Hypertension Brother     Social History   Socioeconomic History  . Marital status: Single    Spouse name: Not on file  . Number of children: Not on file  . Years of education: Not on file  . Highest education level: Not on file  Occupational History  . Occupation: retired  Tobacco Use  . Smoking status: Former Smoker    Quit date: 03/03/2011     Years since quitting: 9.3  . Smokeless tobacco: Never Used  Vaping Use  . Vaping Use: Never used  Substance and Sexual Activity  . Alcohol use: Yes    Comment: 1 beer once or twice a week and a rare mixed drink  . Drug use: No  . Sexual activity: Not Currently  Other Topics Concern  . Not on file  Social History Narrative  . Not on file   Social Determinants of Health   Financial Resource Strain: Low Risk   . Difficulty of Paying Living Expenses: Not hard at all  Food Insecurity: No Food Insecurity  . Worried About Charity fundraiser in the Last Year: Never true  . Ran Out of Food in the Last Year: Never true  Transportation Needs: No Transportation Needs  . Lack of Transportation (Medical): No  . Lack of Transportation (Non-Medical): No  Physical Activity: Sufficiently Active  . Days of Exercise per Week: 7 days  . Minutes of Exercise per Session: 60 min  Stress: No Stress Concern Present  . Feeling of Stress : Not at all  Social Connections: Socially Isolated  . Frequency of Communication with Friends and Family: More than three times a week  . Frequency of Social Gatherings  with Friends and Family: More than three times a week  . Attends Religious Services: Never  . Active Member of Clubs or Organizations: No  . Attends Archivist Meetings: Never  . Marital Status: Never married  Intimate Partner Violence: Not At Risk  . Fear of Current or Ex-Partner: No  . Emotionally Abused: No  . Physically Abused: No  . Sexually Abused: No    Outpatient Medications Prior to Visit  Medication Sig Dispense Refill  . amLODipine (NORVASC) 5 MG tablet Take 1 tablet (5 mg total) by mouth daily. 90 tablet 1  . meclizine (ANTIVERT) 12.5 MG tablet Take 1 tablet (12.5 mg total) by mouth 3 (three) times daily as needed for dizziness. 30 tablet 0  . meloxicam (MOBIC) 7.5 MG tablet Take 1 tablet (7.5 mg total) by mouth daily. 30 tablet 0   No facility-administered medications  prior to visit.    Allergies  Allergen Reactions  . Banana     Stomach cramping and pain  . Codeine Nausea And Vomiting    ROS Review of Systems  Constitutional: Negative.   Respiratory: Negative.   Cardiovascular: Negative.   Musculoskeletal:       Foot pain is improving  Psychiatric/Behavioral: Negative.       Objective:    Physical Exam Constitutional:      Appearance: Normal appearance.  Cardiovascular:     Rate and Rhythm: Normal rate and regular rhythm.     Pulses: Normal pulses.     Heart sounds: Normal heart sounds.  Pulmonary:     Effort: Pulmonary effort is normal.     Breath sounds: Normal breath sounds.  Neurological:     Mental Status: She is alert.  Psychiatric:        Mood and Affect: Mood normal.        Behavior: Behavior normal.        Thought Content: Thought content normal.        Judgment: Judgment normal.     BP 140/70   Pulse 74   Temp 98.2 F (36.8 C)   Resp 20   Ht '5\' 4"'  (1.626 m)   Wt 159 lb (72.1 kg)   SpO2 98%   BMI 27.29 kg/m  Wt Readings from Last 3 Encounters:  07/06/20 159 lb (72.1 kg)  03/11/20 159 lb (72.1 kg)  02/27/20 161 lb (73 kg)     Health Maintenance Due  Topic Date Due  . TETANUS/TDAP  Never done  . PNA vac Low Risk Adult (1 of 2 - PCV13) Never done    There are no preventive care reminders to display for this patient.  Lab Results  Component Value Date   TSH 3.34 09/05/2018   Lab Results  Component Value Date   WBC 6.4 06/22/2020   HGB 14.9 06/22/2020   HCT 44.0 06/22/2020   MCV 99 (H) 06/22/2020   PLT 286 06/22/2020   Lab Results  Component Value Date   NA 139 06/22/2020   K 4.9 06/22/2020   CO2 24 06/22/2020   GLUCOSE 92 06/22/2020   BUN 13 06/22/2020   CREATININE 0.97 06/22/2020   BILITOT 0.5 06/22/2020   ALKPHOS 110 06/22/2020   AST 18 06/22/2020   ALT 17 06/22/2020   PROT 6.7 06/22/2020   ALBUMIN 4.5 06/22/2020   CALCIUM 9.5 06/22/2020   ANIONGAP 8 08/05/2014   EGFR 60  06/22/2020   Lab Results  Component Value Date   CHOL 199 06/22/2020  Lab Results  Component Value Date   HDL 72 06/22/2020   Lab Results  Component Value Date   LDLCALC 104 (H) 06/22/2020   Lab Results  Component Value Date   TRIG 133 06/22/2020   Lab Results  Component Value Date   CHOLHDL 2.8 06/22/2020   Lab Results  Component Value Date   HGBA1C 5.5 06/22/2020      Assessment & Plan:   Problem List Items Addressed This Visit      Cardiovascular and Mediastinum   HTN (hypertension), benign (Chronic)    -BP borderline today -no change in BP meds since home readings have been good      Relevant Orders   CBC with Differential/Platelet   CMP14+EGFR   Lipid Panel With LDL/HDL Ratio     Genitourinary   Chronic kidney disease, stage 2 (mild)    Lab Results  Component Value Date   CREATININE 0.97 06/22/2020   BUN 13 06/22/2020   NA 139 06/22/2020   K 4.9 06/22/2020   CL 101 06/22/2020   CO2 24 06/22/2020   -renal function has improved -maintain adequate hydration      Relevant Orders   CMP14+EGFR     Other   Pain of left foot    -she states that she has been treated at [facility] for left foot pain and that she had a fracture -no longer in boot, and doing better -records requested         No orders of the defined types were placed in this encounter.   Follow-up: Return in about 6 months (around 01/06/2021) for Lab follow-up (HTN, CKD).    Noreene Larsson, NP

## 2020-07-06 NOTE — Assessment & Plan Note (Signed)
Lab Results  Component Value Date   CREATININE 0.97 06/22/2020   BUN 13 06/22/2020   NA 139 06/22/2020   K 4.9 06/22/2020   CL 101 06/22/2020   CO2 24 06/22/2020   -renal function has improved -maintain adequate hydration

## 2020-07-06 NOTE — Patient Instructions (Signed)
Please have fasting labs drawn 2-3 days prior to your appointment so we can discuss the results during your office visit.  

## 2020-10-25 ENCOUNTER — Ambulatory Visit (INDEPENDENT_AMBULATORY_CARE_PROVIDER_SITE_OTHER): Payer: Medicare Other

## 2020-10-25 ENCOUNTER — Other Ambulatory Visit: Payer: Self-pay

## 2020-10-25 DIAGNOSIS — Z23 Encounter for immunization: Secondary | ICD-10-CM

## 2020-11-09 ENCOUNTER — Encounter: Payer: Self-pay | Admitting: Emergency Medicine

## 2020-11-09 ENCOUNTER — Other Ambulatory Visit: Payer: Self-pay

## 2020-11-09 ENCOUNTER — Ambulatory Visit
Admission: EM | Admit: 2020-11-09 | Discharge: 2020-11-09 | Disposition: A | Discharge status: Home / Self Care | Payer: Medicare Other | Attending: Internal Medicine | Admitting: Internal Medicine

## 2020-11-09 DIAGNOSIS — J302 Other seasonal allergic rhinitis: Secondary | ICD-10-CM

## 2020-11-09 MED ORDER — CETIRIZINE HCL 10 MG PO TABS: 10.0000 mg | ORAL_TABLET | Freq: Every day | ORAL | 0 refills | Status: DC

## 2020-11-09 MED ORDER — FLUTICASONE PROPIONATE 50 MCG/ACT NA SUSP: 1.0000 | Freq: Every day | NASAL | 0 refills | Status: DC

## 2020-11-09 NOTE — ED Provider Notes (Signed)
[facility]    CSN: [id number] Arrival date & time: 11/09/20  1001      History   Chief Complaint No chief complaint on file.   HPI Joanne White is a 80 y.o. female comes to the urgent care with 5-day history of nasal congestion, itchy nose and sneezing.  Symptoms started after she mowed her grass last Thursday.  Patient denies any fever or chills.  No generalized body aches.  No shortness of breath, wheezing or sputum production.   HPI  Past Medical History:  Diagnosis Date   Abnormal weight loss    Acute sinusitis, unspecified    Allergy    bananas, codeine; seasonal allergies-tree pollen   Anemia, unspecified    Chronic kidney disease, stage 2 (mild)    Disappearance and death of family member    Dizziness and giddiness    Essential (primary) hypertension    HTN (hypertension), benign 03/03/2014   Hyperkalemia    Hypertension    Hypertensive chronic kidney disease with stage 1 through stage 4 chronic kidney disease, or unspecified chronic kidney disease    Pneumonia, unspecified organism    Tinnitus, right ear    Vertigo     Patient Active Problem List   Diagnosis Date Noted   Vertigo 02/27/2020   Pain of left foot 04/14/2019   Allergy to alpha-gal 02/20/2019   Anemia, unspecified    Chronic kidney disease, stage 2 (mild)    Hypertensive chronic kidney disease with stage 1 through stage 4 chronic kidney disease, or unspecified chronic kidney disease    Cholelithiasis 03/03/2014   HTN (hypertension), benign 03/03/2014    Past Surgical History:  Procedure Laterality Date   CHOLECYSTECTOMY  03/04/2014   Procedure: LAPAROSCOPIC CHOLECYSTECTOMY;  Surgeon: Leighton Ruff, MD;  Location: [facility];  Service: General;;   NO PAST SURGERIES      OB History   No obstetric history on file.      Home Medications    Prior to Admission medications   Medication Sig Start Date End Date Taking? Authorizing Provider  cetirizine (ZYRTEC ALLERGY) 10 MG  tablet Take 1 tablet (10 mg total) by mouth daily. 11/09/20  Yes Fionna Merriott, Myrene Galas, MD  fluticasone (FLONASE) 50 MCG/ACT nasal spray Place 1 spray into both nostrils daily. 11/09/20  Yes Roark Rufo, Myrene Galas, MD  amLODipine (NORVASC) 5 MG tablet Take 1 tablet (5 mg total) by mouth daily. 06/21/20   Noreene Larsson, NP  meclizine (ANTIVERT) 12.5 MG tablet Take 1 tablet (12.5 mg total) by mouth 3 (three) times daily as needed for dizziness. 02/27/20   Noreene Larsson, NP  meloxicam (MOBIC) 7.5 MG tablet Take 1 tablet (7.5 mg total) by mouth daily. 04/14/19   Corum, Rex Kras, MD    Family History Family History  Problem Relation Age of Onset   Hypertension Father    Cancer Father        lung   Heart attack Brother    Hypertension Brother     Social History Social History   Tobacco Use   Smoking status: Former    Types: Cigarettes    Quit date: 03/03/2011    Years since quitting: 9.6   Smokeless tobacco: Never  Vaping Use   Vaping Use: Never used  Substance Use Topics   Alcohol use: Yes    Comment: 1 beer once or twice a week and a rare mixed drink   Drug use: No     Allergies  Banana and Codeine   Review of Systems Review of Systems  Constitutional: Negative.  Negative for activity change.  HENT:  Positive for sore throat. Negative for ear discharge, ear pain and postnasal drip.   Respiratory: Negative.    Cardiovascular: Negative.   Neurological: Negative.  Negative for headaches.    Physical Exam Triage Vital Signs ED Triage Vitals  Enc Vitals Group     BP 11/09/20 1142 (!) 166/81     Pulse Rate 11/09/20 1142 63     Resp 11/09/20 1142 18     Temp 11/09/20 1142 98.1 F (36.7 C)     Temp Source 11/09/20 1142 Oral     SpO2 11/09/20 1142 98 %     Weight --      Height --      Head Circumference --      Peak Flow --      Pain Score 11/09/20 1143 0     Pain Loc --      Pain Edu? --      Excl. in [place]? --    No data found.  Updated Vital Signs BP (!) 166/81 (BP  Location: Right Arm)   Pulse 63   Temp 98.1 F (36.7 C) (Oral)   Resp 18   SpO2 98%   Visual Acuity Right Eye Distance:   Left Eye Distance:   Bilateral Distance:    Right Eye Near:   Left Eye Near:    Bilateral Near:     Physical Exam Vitals and nursing note reviewed.  Constitutional:      General: She is not in acute distress.    Appearance: She is not ill-appearing.  HENT:     Right Ear: Tympanic membrane normal.     Left Ear: Tympanic membrane normal.     Nose: Congestion present.     Mouth/Throat:     Pharynx: Posterior oropharyngeal erythema present.  Cardiovascular:     Rate and Rhythm: Normal rate and regular rhythm.     Pulses: Normal pulses.     Heart sounds: Normal heart sounds.  Pulmonary:     Effort: Pulmonary effort is normal.     Breath sounds: Normal breath sounds.  Neurological:     Mental Status: She is alert.     UC Treatments / Results  Labs (all labs ordered are listed, but only abnormal results are displayed) Labs Reviewed - No data to display  EKG   Radiology No results found.  Procedures Procedures (including critical care time)  Medications Ordered in UC Medications - No data to display  Initial Impression / Assessment and Plan / UC Course  I have reviewed the triage vital signs and the nursing notes.  Pertinent labs & imaging results that were available during my care of the patient were reviewed by me and considered in my medical decision making (see chart for details).     1.  Seasonal allergies: Fluticasone nasal spray Zyrtec 10 mg orally daily Patient is advised to use the home COVID test if you have symptoms worsen. Return to urgent care if symptoms worsen. Final Clinical Impressions(s) / UC Diagnoses   Final diagnoses:  Seasonal allergies     Discharge Instructions      Please take medications as prescribed If you have worsening symptoms please return to urgent care to be reevaluated. If your symptoms  worsen, you could test yourself for COVID at home using your at home COVID test kit.   ED Prescriptions  Medication Sig Dispense Auth. Provider   fluticasone (FLONASE) 50 MCG/ACT nasal spray Place 1 spray into both nostrils daily. 16 g Chase Picket, MD   cetirizine (ZYRTEC ALLERGY) 10 MG tablet Take 1 tablet (10 mg total) by mouth daily. 30 tablet Kamrynn Melott, Myrene Galas, MD      PDMP not reviewed this encounter.   Chase Picket, MD 11/09/20 1245

## 2020-11-09 NOTE — ED Triage Notes (Signed)
Mowed grass on Thursday and a lot of dirt was stirred up.  Started sneezing on Friday.

## 2020-11-09 NOTE — Discharge Instructions (Signed)
Please take medications as prescribed If you have worsening symptoms please return to urgent care to be reevaluated. If your symptoms worsen, you could test yourself for COVID at home using your at home COVID test kit.

## 2020-11-10 ENCOUNTER — Ambulatory Visit: Payer: Medicare Other

## 2020-11-10 DIAGNOSIS — R059 Cough, unspecified: Secondary | ICD-10-CM

## 2020-11-11 ENCOUNTER — Telehealth: Payer: Self-pay | Admitting: Nurse Practitioner

## 2020-11-11 ENCOUNTER — Ambulatory Visit (INDEPENDENT_AMBULATORY_CARE_PROVIDER_SITE_OTHER): Payer: Medicare Other | Admitting: Nurse Practitioner

## 2020-11-11 ENCOUNTER — Encounter: Payer: Self-pay | Admitting: Nurse Practitioner

## 2020-11-11 ENCOUNTER — Other Ambulatory Visit: Payer: Self-pay

## 2020-11-11 DIAGNOSIS — R059 Cough, unspecified: Secondary | ICD-10-CM | POA: Diagnosis not present

## 2020-11-11 MED ORDER — BENZONATATE 100 MG PO CAPS: 200.0000 mg | ORAL_CAPSULE | Freq: Two times a day (BID) | ORAL | 0 refills | Status: DC | PRN

## 2020-11-11 NOTE — Telephone Encounter (Signed)
Patient called in reference to covid test results

## 2020-11-11 NOTE — Telephone Encounter (Signed)
Patient aware results not back yet 

## 2020-11-11 NOTE — Progress Notes (Signed)
Acute Office Visit  Subjective:    Patient ID: Joanne White, female    DOB: 16-Jun-1940, 80 y.o.   MRN: [id number]  Chief Complaint  Patient presents with   Cough    Cough Pertinent negatives include no chills, fever, rhinorrhea, shortness of breath or wheezing.  Patient is in today for sick visit. Symptoms started 11/07/20. She took a COVID test yesterday.  Past Medical History:  Diagnosis Date   Abnormal weight loss    Acute sinusitis, unspecified    Allergy    bananas, codeine; seasonal allergies-tree pollen   Anemia, unspecified    Chronic kidney disease, stage 2 (mild)    Disappearance and death of family member    Dizziness and giddiness    Essential (primary) hypertension    HTN (hypertension), benign 03/03/2014   Hyperkalemia    Hypertension    Hypertensive chronic kidney disease with stage 1 through stage 4 chronic kidney disease, or unspecified chronic kidney disease    Pneumonia, unspecified organism    Tinnitus, right ear    Vertigo     Past Surgical History:  Procedure Laterality Date   CHOLECYSTECTOMY  03/04/2014   Procedure: LAPAROSCOPIC CHOLECYSTECTOMY;  Surgeon: Leighton Ruff, MD;  Location: [facility];  Service: General;;   NO PAST SURGERIES      Family History  Problem Relation Age of Onset   Hypertension Father    Cancer Father        lung   Heart attack Brother    Hypertension Brother     Social History   Socioeconomic History   Marital status: Single    Spouse name: Not on file   Number of children: Not on file   Years of education: Not on file   Highest education level: Not on file  Occupational History   Occupation: retired  Tobacco Use   Smoking status: Former    Types: Cigarettes    Quit date: 03/03/2011    Years since quitting: 9.7   Smokeless tobacco: Never  Vaping Use   Vaping Use: Never used  Substance and Sexual Activity   Alcohol use: Yes    Comment: 1 beer once or twice a week and a rare mixed drink   Drug use: No    Sexual activity: Not Currently  Other Topics Concern   Not on file  Social History Narrative   Not on file   Social Determinants of Health   Financial Resource Strain: Low Risk    Difficulty of Paying Living Expenses: Not hard at all  Food Insecurity: No Food Insecurity   Worried About Charity fundraiser in the Last Year: Never true   [place] in the Last Year: Never true  Transportation Needs: No Transportation Needs   Lack of Transportation (Medical): No   Lack of Transportation (Non-Medical): No  Physical Activity: Sufficiently Active   Days of Exercise per Week: 7 days   Minutes of Exercise per Session: 60 min  Stress: No Stress Concern Present   Feeling of Stress : Not at all  Social Connections: Socially Isolated   Frequency of Communication with Friends and Family: More than three times a week   Frequency of Social Gatherings with Friends and Family: More than three times a week   Attends Religious Services: Never   Marine scientist or Organizations: No   Attends Archivist Meetings: Never   Marital Status: Never married  Intimate Partner Violence: Not At Risk  Fear of Current or Ex-Partner: No   Emotionally Abused: No   Physically Abused: No   Sexually Abused: No    Outpatient Medications Prior to Visit  Medication Sig Dispense Refill   amLODipine (NORVASC) 5 MG tablet Take 1 tablet (5 mg total) by mouth daily. 90 tablet 1   cetirizine (ZYRTEC ALLERGY) 10 MG tablet Take 1 tablet (10 mg total) by mouth daily. 30 tablet 0   fluticasone (FLONASE) 50 MCG/ACT nasal spray Place 1 spray into both nostrils daily. 16 g 0   meclizine (ANTIVERT) 12.5 MG tablet Take 1 tablet (12.5 mg total) by mouth 3 (three) times daily as needed for dizziness. 30 tablet 0   meloxicam (MOBIC) 7.5 MG tablet Take 1 tablet (7.5 mg total) by mouth daily. 30 tablet 0   No facility-administered medications prior to visit.    Allergies  Allergen Reactions   Banana      Stomach cramping and pain   Codeine Nausea And Vomiting    Review of Systems  Constitutional:  Negative for chills, fatigue and fever.  HENT:  Negative for congestion, rhinorrhea, sinus pressure and sinus pain.   Respiratory:  Positive for cough. Negative for shortness of breath and wheezing.       Objective:    Physical Exam  There were no vitals taken for this visit. Wt Readings from Last 3 Encounters:  07/06/20 159 lb (72.1 kg)  03/11/20 159 lb (72.1 kg)  02/27/20 161 lb (73 kg)    Health Maintenance Due  Topic Date Due   TETANUS/TDAP  Never done   Zoster Vaccines- Shingrix (1 of 2) Never done   COVID-19 Vaccine (4 - Booster for Moderna series) 04/07/2020    There are no preventive care reminders to display for this patient.   Lab Results  Component Value Date   TSH 3.34 09/05/2018   Lab Results  Component Value Date   WBC 6.4 06/22/2020   HGB 14.9 06/22/2020   HCT 44.0 06/22/2020   MCV 99 (H) 06/22/2020   PLT 286 06/22/2020   Lab Results  Component Value Date   NA 139 06/22/2020   K 4.9 06/22/2020   CO2 24 06/22/2020   GLUCOSE 92 06/22/2020   BUN 13 06/22/2020   CREATININE 0.97 06/22/2020   BILITOT 0.5 06/22/2020   ALKPHOS 110 06/22/2020   AST 18 06/22/2020   ALT 17 06/22/2020   PROT 6.7 06/22/2020   ALBUMIN 4.5 06/22/2020   CALCIUM 9.5 06/22/2020   ANIONGAP 8 08/05/2014   EGFR 60 06/22/2020   Lab Results  Component Value Date   CHOL 199 06/22/2020   Lab Results  Component Value Date   HDL 72 06/22/2020   Lab Results  Component Value Date   LDLCALC 104 (H) 06/22/2020   Lab Results  Component Value Date   TRIG 133 06/22/2020   Lab Results  Component Value Date   CHOLHDL 2.8 06/22/2020   Lab Results  Component Value Date   HGBA1C 5.5 06/22/2020       Assessment & Plan:   Problem List Items Addressed This Visit       Other   Cough    -mild and occasional -Rx. Tessalon perles -she had COVID test yesterday, but no results  back yet -she was treated with flonase and zyrtec by urgent care on 9/27, and she was mainly concerned with her COVID results        Meds ordered this encounter  Medications   benzonatate (TESSALON)  100 MG capsule    Sig: Take 2 capsules (200 mg total) by mouth 2 (two) times daily as needed for cough.    Dispense:  30 capsule    Refill:  0   Date:  11/11/2020   Location of Patient: Home Location of Provider: Office Consent was obtain for visit to be over via telehealth. I verified that I am speaking with the correct person using two identifiers.  I connected with  [name] on 11/11/20 via telephone and verified that I am speaking with the correct person using two identifiers.   I discussed the limitations of evaluation and management by telemedicine. The patient expressed understanding and agreed to proceed.  Time spent: 6 min     Noreene Larsson, NP

## 2020-11-11 NOTE — Assessment & Plan Note (Signed)
-  mild and occasional -Rx. Tessalon perles -she had COVID test yesterday, but no results back yet -she was treated with flonase and zyrtec by urgent care on 9/27, and she was mainly concerned with her COVID results

## 2020-11-12 ENCOUNTER — Other Ambulatory Visit: Payer: Self-pay | Admitting: Nurse Practitioner

## 2020-11-12 LAB — NOVEL CORONAVIRUS, NAA: SARS-CoV-2, NAA: DETECTED — AB

## 2020-11-12 LAB — SARS-COV-2, NAA 2 DAY TAT

## 2020-11-12 MED ORDER — NIRMATRELVIR/RITONAVIR (PAXLOVID)TABLET: 3.0000 | ORAL_TABLET | Freq: Two times a day (BID) | ORAL | 0 refills | Status: AC

## 2020-11-12 NOTE — Progress Notes (Signed)
She is COVID positive. I sent in paxlovid, the COVID medicine to [facility].

## 2020-11-24 ENCOUNTER — Other Ambulatory Visit: Payer: Self-pay

## 2020-11-24 ENCOUNTER — Other Ambulatory Visit: Payer: Self-pay | Admitting: Nurse Practitioner

## 2020-11-24 MED ORDER — MECLIZINE HCL 12.5 MG PO TABS: 12.5000 mg | ORAL_TABLET | Freq: Three times a day (TID) | ORAL | 0 refills | Status: DC | PRN

## 2020-11-24 NOTE — Telephone Encounter (Signed)
Filled meclizine 12.5, meclizxine 25 was prescribed at [facility]

## 2020-12-24 ENCOUNTER — Other Ambulatory Visit: Payer: Self-pay | Admitting: Nurse Practitioner

## 2020-12-24 DIAGNOSIS — I10 Essential (primary) hypertension: Secondary | ICD-10-CM

## 2021-01-04 DIAGNOSIS — I10 Essential (primary) hypertension: Secondary | ICD-10-CM | POA: Diagnosis not present

## 2021-01-04 DIAGNOSIS — N182 Chronic kidney disease, stage 2 (mild): Secondary | ICD-10-CM | POA: Diagnosis not present

## 2021-01-05 LAB — CBC WITH DIFFERENTIAL/PLATELET
Basophils Absolute: 0.1 10*3/uL (ref 0.0–0.2)
Basos: 1 %
EOS (ABSOLUTE): 0.2 10*3/uL (ref 0.0–0.4)
Eos: 3 %
Hematocrit: 47.7 % — ABNORMAL HIGH (ref 34.0–46.6)
Hemoglobin: 16.5 g/dL — ABNORMAL HIGH (ref 11.1–15.9)
Immature Grans (Abs): 0 10*3/uL (ref 0.0–0.1)
Immature Granulocytes: 1 %
Lymphocytes Absolute: 2.5 10*3/uL (ref 0.7–3.1)
Lymphs: 34 %
MCH: 33.7 pg — ABNORMAL HIGH (ref 26.6–33.0)
MCHC: 34.6 g/dL (ref 31.5–35.7)
MCV: 97 fL (ref 79–97)
Monocytes Absolute: 0.6 10*3/uL (ref 0.1–0.9)
Monocytes: 8 %
Neutrophils Absolute: 4 10*3/uL (ref 1.4–7.0)
Neutrophils: 53 %
Platelets: 251 10*3/uL (ref 150–450)
RBC: 4.9 x10E6/uL (ref 3.77–5.28)
RDW: 12.1 % (ref 11.7–15.4)
WBC: 7.4 10*3/uL (ref 3.4–10.8)

## 2021-01-05 LAB — LIPID PANEL WITH LDL/HDL RATIO
Cholesterol, Total: 207 mg/dL — ABNORMAL HIGH (ref 100–199)
HDL: 63 mg/dL (ref >39)
LDL Chol Calc (NIH): 122 mg/dL — ABNORMAL HIGH (ref 0–99)
LDL/HDL Ratio: 1.9 ratio (ref 0.0–3.2)
Triglycerides: 123 mg/dL (ref 0–149)
VLDL Cholesterol Cal: 22 mg/dL (ref 5–40)

## 2021-01-05 LAB — CMP14+EGFR
ALT: 17 IU/L (ref 0–32)
AST: 16 IU/L (ref 0–40)
Albumin/Globulin Ratio: 1.9 (ref 1.2–2.2)
Albumin: 4.5 g/dL (ref 3.7–4.7)
Alkaline Phosphatase: 111 IU/L (ref 44–121)
BUN/Creatinine Ratio: 14 (ref 12–28)
BUN: 15 mg/dL (ref 8–27)
Bilirubin Total: 0.3 mg/dL (ref 0.0–1.2)
CO2: 26 mmol/L (ref 20–29)
Calcium: 9.5 mg/dL (ref 8.7–10.3)
Chloride: 103 mmol/L (ref 96–106)
Creatinine, Ser: 1.11 mg/dL — ABNORMAL HIGH (ref 0.57–1.00)
Globulin, Total: 2.4 g/dL (ref 1.5–4.5)
Glucose: 91 mg/dL (ref 70–99)
Potassium: 4.6 mmol/L (ref 3.5–5.2)
Sodium: 141 mmol/L (ref 134–144)
Total Protein: 6.9 g/dL (ref 6.0–8.5)
eGFR: 51 mL/min/{1.73_m2} — ABNORMAL LOW (ref >59)

## 2021-01-11 ENCOUNTER — Ambulatory Visit: Payer: Medicare Other | Admitting: Nurse Practitioner

## 2021-01-11 DIAGNOSIS — H04123 Dry eye syndrome of bilateral lacrimal glands: Secondary | ICD-10-CM | POA: Diagnosis not present

## 2021-01-17 ENCOUNTER — Ambulatory Visit: Payer: Medicare Other | Admitting: Nurse Practitioner

## 2021-01-19 NOTE — Progress Notes (Signed)
You will see her on 02/14/21. Hgb is elevated, but she has had this in the past.

## 2021-02-14 ENCOUNTER — Encounter: Payer: Self-pay | Admitting: Internal Medicine

## 2021-02-14 ENCOUNTER — Other Ambulatory Visit: Payer: Self-pay

## 2021-02-14 ENCOUNTER — Ambulatory Visit (INDEPENDENT_AMBULATORY_CARE_PROVIDER_SITE_OTHER): Payer: Medicare Other | Admitting: Internal Medicine

## 2021-02-14 VITALS — BP 152/80 | HR 70 | Resp 18 | Ht 64.0 in | Wt 166.1 lb

## 2021-02-14 DIAGNOSIS — I10 Essential (primary) hypertension: Secondary | ICD-10-CM | POA: Diagnosis not present

## 2021-02-14 DIAGNOSIS — E782 Mixed hyperlipidemia: Secondary | ICD-10-CM | POA: Diagnosis not present

## 2021-02-14 DIAGNOSIS — N1831 Chronic kidney disease, stage 3a: Secondary | ICD-10-CM | POA: Diagnosis not present

## 2021-02-14 NOTE — Patient Instructions (Signed)
Please continue to take medications as prescribed.  Please continue to follow low salt diet and ambulate as tolerated.  Please consider getting Shingrix vaccine at your local pharmacy. Please ask your Pharmacy about dates of Pneumonia vaccines.  Please get fasting blood tests done before next visit.

## 2021-02-14 NOTE — Assessment & Plan Note (Signed)
Lipid profile reviewed Advised to follow DASH diet for now 

## 2021-02-14 NOTE — Assessment & Plan Note (Signed)
BP Readings from Last 1 Encounters:  02/14/21 (!) 152/80   Elevated today, but remains well-controlled at home around 120s-130s/80s mostly Counseled for compliance with the medications Advised DASH diet and moderate exercise/walking as tolerated

## 2021-02-14 NOTE — Progress Notes (Signed)
Established Patient Office Visit  Subjective:  Patient ID: Joanne White, female    DOB: 1941/01/20  Age: 81 y.o. MRN: [id number]  CC:  Chief Complaint  Patient presents with   Follow-up    6 month follow up     HPI Joanne White is a 81 y.o. female with past medical history of HTN who presents for f/u of her chronic medical conditions.  HTN: Her BP was elevated in the office today, which improved at the later part of the visit.  She states that her BP remains well controlled at home.  She denies any headache, dizziness, chest pain, dyspnea or palpitations.  Blood test where reviewed and discussed with the patient in detail.  She agrees to improve hydration as her kidney function has declined compared to prior.  Past Medical History:  Diagnosis Date   Abnormal weight loss    Acute sinusitis, unspecified    Allergy    bananas, codeine; seasonal allergies-tree pollen   Anemia, unspecified    Cholelithiasis 03/03/2014   -status post cholecystectomy   Chronic kidney disease, stage 2 (mild)    Disappearance and death of family member    Dizziness and giddiness    Essential (primary) hypertension    HTN (hypertension), benign 03/03/2014   Hyperkalemia    Hypertension    Hypertensive chronic kidney disease with stage 1 through stage 4 chronic kidney disease, or unspecified chronic kidney disease    Pneumonia, unspecified organism    Tinnitus, right ear    Vertigo     Past Surgical History:  Procedure Laterality Date   CHOLECYSTECTOMY  03/04/2014   Procedure: LAPAROSCOPIC CHOLECYSTECTOMY;  Surgeon: Leighton Ruff, MD;  Location: [facility];  Service: General;;   NO PAST SURGERIES      Family History  Problem Relation Age of Onset   Hypertension Father    Cancer Father        lung   Heart attack Brother    Hypertension Brother     Social History   Socioeconomic History   Marital status: Single    Spouse name: Not on file   Number of children: Not on file   Years of  education: Not on file   Highest education level: Not on file  Occupational History   Occupation: retired  Tobacco Use   Smoking status: Former    Types: Cigarettes    Quit date: 03/03/2011    Years since quitting: 9.9   Smokeless tobacco: Never  Vaping Use   Vaping Use: Never used  Substance and Sexual Activity   Alcohol use: Yes    Comment: 1 beer once or twice a week and a rare mixed drink   Drug use: No   Sexual activity: Not Currently  Other Topics Concern   Not on file  Social History Narrative   Not on file   Social Determinants of Health   Financial Resource Strain: Low Risk    Difficulty of Paying Living Expenses: Not hard at all  Food Insecurity: No Food Insecurity   Worried About Charity fundraiser in the Last Year: Never true   Ran Out of Food in the Last Year: Never true  Transportation Needs: No Transportation Needs   Lack of Transportation (Medical): No   Lack of Transportation (Non-Medical): No  Physical Activity: Sufficiently Active   Days of Exercise per Week: 7 days   Minutes of Exercise per Session: 60 min  Stress: No Stress Concern Present  Feeling of Stress : Not at all  Social Connections: Socially Isolated   Frequency of Communication with Friends and Family: More than three times a week   Frequency of Social Gatherings with Friends and Family: More than three times a week   Attends Religious Services: Never   Marine scientist or Organizations: No   Attends Music therapist: Never   Marital Status: Never married  Human resources officer Violence: Not At Risk   Fear of Current or Ex-Partner: No   Emotionally Abused: No   Physically Abused: No   Sexually Abused: No    Outpatient Medications Prior to Visit  Medication Sig Dispense Refill   amLODipine (NORVASC) 5 MG tablet TAKE (1) TABLET BY MOUTH ONCE DAILY. 90 tablet 0   meclizine (ANTIVERT) 12.5 MG tablet Take 1 tablet (12.5 mg total) by mouth 3 (three) times daily as needed  for dizziness. (Patient not taking: Reported on 02/14/2021) 30 tablet 0   benzonatate (TESSALON) 100 MG capsule Take 2 capsules (200 mg total) by mouth 2 (two) times daily as needed for cough. (Patient not taking: Reported on 02/14/2021) 30 capsule 0   cetirizine (ZYRTEC ALLERGY) 10 MG tablet Take 1 tablet (10 mg total) by mouth daily. (Patient not taking: Reported on 02/14/2021) 30 tablet 0   fluticasone (FLONASE) 50 MCG/ACT nasal spray Place 1 spray into both nostrils daily. (Patient not taking: Reported on 02/14/2021) 16 g 0   meloxicam (MOBIC) 7.5 MG tablet Take 1 tablet (7.5 mg total) by mouth daily. (Patient not taking: Reported on 02/14/2021) 30 tablet 0   No facility-administered medications prior to visit.    Allergies  Allergen Reactions   Banana     Stomach cramping and pain   Codeine Nausea And Vomiting    ROS Review of Systems  Constitutional:  Negative for chills and fever.  HENT:  Negative for congestion, sinus pressure, sinus pain and sore throat.   Eyes:  Negative for pain and discharge.  Respiratory:  Negative for cough and shortness of breath.   Cardiovascular:  Negative for chest pain and palpitations.  Gastrointestinal:  Negative for abdominal pain, constipation, diarrhea, nausea and vomiting.  Endocrine: Negative for polydipsia and polyuria.  Genitourinary:  Negative for dysuria and hematuria.  Musculoskeletal:  Negative for neck pain and neck stiffness.  Skin:  Negative for rash.  Neurological:  Negative for dizziness and weakness.  Psychiatric/Behavioral:  Negative for agitation and behavioral problems.      Objective:    Physical Exam Vitals reviewed.  Constitutional:      General: She is not in acute distress.    Appearance: She is not diaphoretic.  HENT:     Head: Normocephalic and atraumatic.     Nose: Nose normal. No congestion.     Mouth/Throat:     Mouth: Mucous membranes are moist.     Pharynx: No posterior oropharyngeal erythema.  Eyes:      General: No scleral icterus.    Extraocular Movements: Extraocular movements intact.  Cardiovascular:     Rate and Rhythm: Normal rate and regular rhythm.     Pulses: Normal pulses.     Heart sounds: Normal heart sounds. No murmur heard. Pulmonary:     Breath sounds: Normal breath sounds. No wheezing or rales.  Musculoskeletal:     Cervical back: Neck supple. No tenderness.     Right lower leg: No edema.     Left lower leg: No edema.  Skin:    General:  Skin is warm.     Findings: No rash.  Neurological:     General: No focal deficit present.     Mental Status: She is alert and oriented to person, place, and time.     Sensory: No sensory deficit.     Motor: No weakness.  Psychiatric:        Mood and Affect: Mood normal.        Behavior: Behavior normal.    BP (!) 152/80 (BP Location: Right Arm)    Pulse 70    Resp 18    Ht '5\' 4"'  (1.626 m)    Wt 166 lb 1.3 oz (75.3 kg)    SpO2 99%    BMI 28.51 kg/m  Wt Readings from Last 3 Encounters:  02/14/21 166 lb 1.3 oz (75.3 kg)  07/06/20 159 lb (72.1 kg)  03/11/20 159 lb (72.1 kg)    Lab Results  Component Value Date   TSH 3.34 09/05/2018   Lab Results  Component Value Date   WBC 7.4 01/04/2021   HGB 16.5 (H) 01/04/2021   HCT 47.7 (H) 01/04/2021   MCV 97 01/04/2021   PLT 251 01/04/2021   Lab Results  Component Value Date   NA 141 01/04/2021   K 4.6 01/04/2021   CO2 26 01/04/2021   GLUCOSE 91 01/04/2021   BUN 15 01/04/2021   CREATININE 1.11 (H) 01/04/2021   BILITOT 0.3 01/04/2021   ALKPHOS 111 01/04/2021   AST 16 01/04/2021   ALT 17 01/04/2021   PROT 6.9 01/04/2021   ALBUMIN 4.5 01/04/2021   CALCIUM 9.5 01/04/2021   ANIONGAP 8 08/05/2014   EGFR 51 (L) 01/04/2021   Lab Results  Component Value Date   CHOL 207 (H) 01/04/2021   Lab Results  Component Value Date   HDL 63 01/04/2021   Lab Results  Component Value Date   LDLCALC 122 (H) 01/04/2021   Lab Results  Component Value Date   TRIG 123 01/04/2021    Lab Results  Component Value Date   CHOLHDL 2.8 06/22/2020   Lab Results  Component Value Date   HGBA1C 5.5 06/22/2020      Assessment & Plan:   Problem List Items Addressed This Visit       Cardiovascular and Mediastinum   HTN (hypertension), benign - Primary (Chronic)    BP Readings from Last 1 Encounters:  02/14/21 (!) 152/80  Elevated today, but remains well-controlled at home around 120s-130s/80s mostly Counseled for compliance with the medications Advised DASH diet and moderate exercise/walking as tolerated       Relevant Orders   CBC with Differential/Platelet     Genitourinary   Chronic kidney disease, stage 3a ([place])    Decline in renal function Advised to improve hydration Avoid nephrotoxic agents      Relevant Orders   Basic Metabolic Panel (BMET)     Other   Mixed hyperlipidemia    Lipid profile reviewed Advised to follow DASH diet for now       No orders of the defined types were placed in this encounter.   Follow-up: Return in about 4 months (around 06/14/2021) for Annual physical.    Lindell Spar, MD

## 2021-02-14 NOTE — Assessment & Plan Note (Signed)
Decline in renal function Advised to improve hydration Avoid nephrotoxic agents

## 2021-03-23 ENCOUNTER — Other Ambulatory Visit: Payer: Self-pay | Admitting: Family Medicine

## 2021-03-23 DIAGNOSIS — I10 Essential (primary) hypertension: Secondary | ICD-10-CM

## 2021-04-11 ENCOUNTER — Other Ambulatory Visit: Payer: Self-pay

## 2021-04-11 ENCOUNTER — Ambulatory Visit (INDEPENDENT_AMBULATORY_CARE_PROVIDER_SITE_OTHER): Payer: Medicare Other | Admitting: Family Medicine

## 2021-04-11 DIAGNOSIS — Z0000 Encounter for general adult medical examination without abnormal findings: Secondary | ICD-10-CM

## 2021-04-11 NOTE — Patient Instructions (Signed)
°  Joanne White , Thank you for taking time to come for your Medicare Wellness Visit. I appreciate your ongoing commitment to your health goals. Please review the following plan we discussed and let me know if I can assist you in the future.   These are the goals we discussed:  Goals   None     This is a list of the screening recommended for you and due dates:  Health Maintenance  Topic Date Due   Tetanus Vaccine  Never done   Zoster (Shingles) Vaccine (1 of 2) Never done   Pneumonia Vaccine (1 - PCV) Never done   DEXA scan (bone density measurement)  Never done   COVID-19 Vaccine (4 - Booster for Moderna series) 01/31/2020   Flu Shot  Completed   Hepatitis C Screening: USPSTF Recommendation to screen - Ages 18-79 yo.  Completed   HPV Vaccine  Aged Out

## 2021-04-11 NOTE — Progress Notes (Addendum)
Subjective:   Joanne White is a 81 y.o. female who presents for Medicare Annual (Subsequent) preventive examination. I connected with  Joanne White on 04/11/21 by a audio enabled telemedicine application and verified that I am speaking with the correct person using two identifiers.  Patient Location: Home  Provider Location: Office/Clinic  I discussed the limitations of evaluation and management by telemedicine. The patient expressed understanding and agreed to proceed.  Review of Systems     Cardiac Risk Factors include: advanced age (>33men, >3 women);hypertension     Objective:    Today's Vitals   04/11/21 0939  PainSc: 0-No pain   There is no height or weight on file to calculate BMI.  Advanced Directives 04/11/2021 03/11/2020 08/05/2014 03/03/2014 03/02/2014 03/02/2014 02/24/2014  Does Patient Have a Medical Advance Directive? No Yes No No No No No  Type of Advance Directive - Living will - - - - -  Would patient like information on creating a medical advance directive? No - Patient declined No - Patient declined - No - patient declined information No - patient declined information - No - patient declined information    Current Medications (verified) Outpatient Encounter Medications as of 04/11/2021  Medication Sig   amLODipine (NORVASC) 5 MG tablet TAKE (1) TABLET BY MOUTH ONCE DAILY.   meclizine (ANTIVERT) 12.5 MG tablet Take 1 tablet (12.5 mg total) by mouth 3 (three) times daily as needed for dizziness. (Patient not taking: Reported on 02/14/2021)   No facility-administered encounter medications on file as of 04/11/2021.    Allergies (verified) Banana and Codeine   History: Past Medical History:  Diagnosis Date   Abnormal weight loss    Acute sinusitis, unspecified    Allergy    bananas, codeine; seasonal allergies-tree pollen   Anemia, unspecified    Cholelithiasis 03/03/2014   -status post cholecystectomy   Chronic kidney disease, stage 2 (mild)     Disappearance and death of family member    Dizziness and giddiness    Essential (primary) hypertension    HTN (hypertension), benign 03/03/2014   Hyperkalemia    Hypertension    Hypertensive chronic kidney disease with stage 1 through stage 4 chronic kidney disease, or unspecified chronic kidney disease    Pneumonia, unspecified organism    Tinnitus, right ear    Vertigo    Past Surgical History:  Procedure Laterality Date   CHOLECYSTECTOMY  03/04/2014   Procedure: LAPAROSCOPIC CHOLECYSTECTOMY;  Surgeon: Leighton Ruff, MD;  Location: [facility];  Service: General;;   NO PAST SURGERIES     Family History  Problem Relation Age of Onset   Hypertension Father    Cancer Father        lung   Heart attack Brother    Hypertension Brother    Social History   Socioeconomic History   Marital status: Single    Spouse name: Not on file   Number of children: Not on file   Years of education: Not on file   Highest education level: Not on file  Occupational History   Occupation: retired  Tobacco Use   Smoking status: Former    Types: Cigarettes    Quit date: 03/03/2011    Years since quitting: 10.1   Smokeless tobacco: Never  Vaping Use   Vaping Use: Never used  Substance and Sexual Activity   Alcohol use: Yes    Comment: 1 beer once or twice a week and a rare mixed drink   Drug use:  No   Sexual activity: Not Currently  Other Topics Concern   Not on file  Social History Narrative   Not on file   Social Determinants of Health   Financial Resource Strain: Low Risk    Difficulty of Paying Living Expenses: Not hard at all  Food Insecurity: No Food Insecurity   Worried About Running Out of Food in the Last Year: Never true   [place] in the Last Year: Never true  Transportation Needs: No Transportation Needs   Lack of Transportation (Medical): No   Lack of Transportation (Non-Medical): No  Physical Activity: Insufficiently Active   Days of Exercise per Week: 3 days    Minutes of Exercise per Session: 40 min  Stress: No Stress Concern Present   Feeling of Stress : Not at all  Social Connections: Moderately Isolated   Frequency of Communication with Friends and Family: More than three times a week   Frequency of Social Gatherings with Friends and Family: Three times a week   Attends Religious Services: 1 to 4 times per year   Active Member of Clubs or Organizations: No   Attends Archivist Meetings: Never   Marital Status: Never married    Tobacco Counseling Counseling given: Not Answered   Clinical Intake:     Pain : No/denies pain Pain Score: 0-No pain     Diabetes: No  How often do you need to have someone help you when you read instructions, pamphlets, or other written materials from your doctor or pharmacy?: 1 - Never What is the last grade level you completed in school?: 12th  Diabetic? No  Interpreter Needed?: No      Activities of Daily Living In your present state of health, do you have any difficulty performing the following activities: 04/11/2021  Hearing? N  Vision? Y  Comment glasses  Difficulty concentrating or making decisions? N  Walking or climbing stairs? N  Dressing or bathing? N  Doing errands, shopping? N  Preparing Food and eating ? N  Using the Toilet? N  In the past six months, have you accidently leaked urine? N  Do you have problems with loss of bowel control? N  Managing your Medications? N  Managing your Finances? N  Housekeeping or managing your Housekeeping? N  Some recent data might be hidden    Patient Care Team: Lindell Spar, MD as PCP - General (Internal Medicine)  Indicate any recent Medical Services you may have received from other than [facility] providers in the past year (date may be approximate).     Assessment:   This is a routine wellness examination for Joanne White.  Hearing/Vision screen No results found.  Dietary issues and exercise activities discussed: Current  Exercise Habits: Home exercise routine, Type of exercise: walking, Time (Minutes): 30, Frequency (Times/Week): 2, Weekly Exercise (Minutes/Week): 60, Intensity: Moderate, Exercise limited by: None identified   Goals Addressed   None    Depression Screen PHQ 2/9 Scores 04/11/2021 04/11/2021 02/14/2021 11/11/2020 07/06/2020 03/11/2020 02/27/2020  PHQ - 2 Score 0 0 0 0 0 0 0    Fall Risk Fall Risk  04/11/2021 02/14/2021 11/11/2020 07/06/2020 03/11/2020  Falls in the past year? 1 0 0 1 0  Comment 12/2020 dog pulled her down - - - -  Number falls in past yr: 0 0 0 0 0  Injury with Fall? 0 0 0 1 0  Risk for fall due to : No Fall Risks No Fall Risks No  Fall Risks Impaired balance/gait No Fall Risks  Risk for fall due to: Comment - - - dog pulled her down, chipped bones in L foot. -  Follow up Falls prevention discussed Falls evaluation completed Falls evaluation completed Falls evaluation completed Falls evaluation completed    [place]:  Any stairs in or around the home? No  If so, are there any without handrails? No  Home free of loose throw rugs in walkways, pet beds, electrical cords, etc? No  Adequate lighting in your home to reduce risk of falls? Yes   ASSISTIVE DEVICES UTILIZED TO PREVENT FALLS:  Life alert? No  Use of a cane, [place] or w/c? No  Grab bars in the bathroom? Yes  Shower chair or bench in shower? Yes  Elevated toilet seat or a handicapped toilet? No     Cognitive Function:     6CIT Screen 04/11/2021 03/11/2020  What Year? 0 points 0 points  What month? 0 points 0 points  What time? 0 points 0 points  Count back from 20 0 points 0 points  Months in reverse 0 points 0 points  Repeat phrase 0 points -  Total Score 0 -    Immunizations Immunization History  Administered Date(s) Administered   Fluad Quad(high Dose 65+) 10/25/2020   Influenza, High Dose Seasonal PF 11/08/2015, 11/06/2016, 11/01/2017   Influenza,inj,Quad PF,6+ Mos  10/16/2018   Influenza-Unspecified 10/28/2014, 12/15/2019   Moderna Sars-Covid-2 Vaccination 03/07/2019, 04/09/2019, 12/06/2019    TDAP status: Up to date  Flu Vaccine status: Up to date  Pneumococcal vaccine status: Up to date  Covid-19 vaccine status: Completed vaccines  Qualifies for Shingles Vaccine? Yes   Zostavax completed Yes   Shingrix Completed?: No.    Education has been provided regarding the importance of this vaccine. Patient has been advised to call insurance company to determine out of pocket expense if they have not yet received this vaccine. Advised may also receive vaccine at local pharmacy or Health Dept. Verbalized acceptance and understanding.  Screening Tests Health Maintenance  Topic Date Due   TETANUS/TDAP  Never done   Zoster Vaccines- Shingrix (1 of 2) Never done   Pneumonia Vaccine 52+ Years old (1 - PCV) Never done   DEXA SCAN  Never done   COVID-19 Vaccine (4 - Booster for Moderna series) 01/31/2020   INFLUENZA VACCINE  Completed   Hepatitis C Screening  Completed   HPV VACCINES  Aged Out    Health Maintenance  Health Maintenance Due  Topic Date Due   TETANUS/TDAP  Never done   Zoster Vaccines- Shingrix (1 of 2) Never done   Pneumonia Vaccine 69+ Years old (1 - PCV) Never done   DEXA SCAN  Never done   COVID-19 Vaccine (4 - Booster for Moderna series) 01/31/2020    Colorectal cancer screening: No longer required.   Mammogram status: No longer required due to patient age.  Bone Density status: Ordered (declined). Pt provided with contact info and advised to call to schedule appt.  Lung Cancer Screening: (Low Dose CT Chest recommended if Age 61-80 years, 30 pack-year currently smoking OR have quit w/in 15years.) does not qualify.   Lung Cancer Screening Referral: n/a  Additional Screening:  Hepatitis C Screening: does qualify; Completed 06/22/2020  Vision Screening: Recommended annual ophthalmology exams for early detection of glaucoma  and other disorders of the eye. Is the patient up to date with their annual eye exam?  No  Who is the  provider or what is the name of the office in which the patient attends annual eye exams? N/a If pt is not established with a provider, would they like to be referred to a provider to establish care?  Dr. Posey Pronto is her PCP .   Dental Screening: Recommended annual dental exams for proper oral hygiene  Community Resource Referral / Chronic Care Management: CRR required this visit?  No   CCM required this visit?  No      Plan:     I have personally reviewed and noted the following in the patients chart:   Medical and social history Use of alcohol, tobacco or illicit drugs  Current medications and supplements including opioid prescriptions.  Functional ability and status Nutritional status Physical activity Advanced directives List of other physicians Hospitalizations, surgeries, and ER visits in previous 12 months Vitals Screenings to include cognitive, depression, and falls Referrals and appointments  In addition, I have reviewed and discussed with patient certain preventive protocols, quality metrics, and best practice recommendations. A written personalized care plan for preventive services as well as general preventive health recommendations were provided to patient.     Ms. Pistone , Thank you for taking time to come for your Medicare Wellness Visit. I appreciate your ongoing commitment to your health goals. Please review the following plan we discussed and let me know if I can assist you in the future.   These are the goals we discussed:  Goals   None     This is a list of the screening recommended for you and due dates:  Health Maintenance  Topic Date Due   Tetanus Vaccine  Never done   Zoster (Shingles) Vaccine (1 of 2) Never done   Pneumonia Vaccine (1 - PCV) Never done   DEXA scan (bone density measurement)  Never done   COVID-19 Vaccine (4 - Booster for  Moderna series) 01/31/2020   Flu Shot  Completed   Hepatitis C Screening: USPSTF Recommendation to screen - Ages 18-79 yo.  Completed   HPV Vaccine  Aged [address], [place]   04/11/2021   Nurse Notes:

## 2021-04-14 ENCOUNTER — Telehealth: Payer: Self-pay

## 2021-04-14 NOTE — Telephone Encounter (Signed)
Spoke with pt will request records from halls office  ?

## 2021-04-14 NOTE — Telephone Encounter (Signed)
Patient returning call, call back [phone]. ?

## 2021-04-14 NOTE — Telephone Encounter (Signed)
Patient called to get information to nurse from her last visit she has had her tetna shot and pneumonia shot. ?

## 2021-04-14 NOTE — Telephone Encounter (Signed)
LVM for pt to call the office.

## 2021-06-06 DIAGNOSIS — N1831 Chronic kidney disease, stage 3a: Secondary | ICD-10-CM | POA: Diagnosis not present

## 2021-06-06 DIAGNOSIS — I10 Essential (primary) hypertension: Secondary | ICD-10-CM | POA: Diagnosis not present

## 2021-06-07 LAB — CBC WITH DIFFERENTIAL/PLATELET
Basophils Absolute: 0.1 10*3/uL (ref 0.0–0.2)
Basos: 1 %
EOS (ABSOLUTE): 0.3 10*3/uL (ref 0.0–0.4)
Eos: 4 %
Hematocrit: 44.4 % (ref 34.0–46.6)
Hemoglobin: 16.1 g/dL — ABNORMAL HIGH (ref 11.1–15.9)
Immature Grans (Abs): 0 10*3/uL (ref 0.0–0.1)
Immature Granulocytes: 0 %
Lymphocytes Absolute: 3 10*3/uL (ref 0.7–3.1)
Lymphs: 40 %
MCH: 35 pg — ABNORMAL HIGH (ref 26.6–33.0)
MCHC: 36.3 g/dL — ABNORMAL HIGH (ref 31.5–35.7)
MCV: 97 fL (ref 79–97)
Monocytes Absolute: 0.6 10*3/uL (ref 0.1–0.9)
Monocytes: 8 %
Neutrophils Absolute: 3.5 10*3/uL (ref 1.4–7.0)
Neutrophils: 47 %
Platelets: 244 10*3/uL (ref 150–450)
RBC: 4.6 x10E6/uL (ref 3.77–5.28)
RDW: 12.2 % (ref 11.7–15.4)
WBC: 7.5 10*3/uL (ref 3.4–10.8)

## 2021-06-07 LAB — BASIC METABOLIC PANEL
BUN/Creatinine Ratio: 14 (ref 12–28)
BUN: 16 mg/dL (ref 8–27)
CO2: 25 mmol/L (ref 20–29)
Calcium: 9.5 mg/dL (ref 8.7–10.3)
Chloride: 105 mmol/L (ref 96–106)
Creatinine, Ser: 1.12 mg/dL — ABNORMAL HIGH (ref 0.57–1.00)
Glucose: 97 mg/dL (ref 70–99)
Potassium: 5.3 mmol/L — ABNORMAL HIGH (ref 3.5–5.2)
Sodium: 142 mmol/L (ref 134–144)
eGFR: 50 mL/min/{1.73_m2} — ABNORMAL LOW (ref >59)

## 2021-06-09 ENCOUNTER — Telehealth: Payer: Self-pay

## 2021-06-09 ENCOUNTER — Telehealth: Payer: Self-pay | Admitting: Internal Medicine

## 2021-06-09 NOTE — Telephone Encounter (Signed)
Pt would like to know why cholesterol was not on lab results  ?

## 2021-06-09 NOTE — Telephone Encounter (Signed)
FYI patient in lobby. Patient came into office asking about cholesterol lab results needs a copy, was not listed on lab copy when picked up.  ?

## 2021-06-09 NOTE — Telephone Encounter (Signed)
Call patient when resulted and she will come back to pick up a copy. ?

## 2021-06-09 NOTE — Telephone Encounter (Signed)
Pt called wanting results of lab work. She is also wanting to know if she can pick up a copy of this as well.  ? ? ? ? ?Call back # [phone] ?

## 2021-06-09 NOTE — Telephone Encounter (Signed)
Pt advised with verbal understanding  °

## 2021-06-09 NOTE — Telephone Encounter (Signed)
Noted  

## 2021-06-14 ENCOUNTER — Encounter: Payer: Self-pay | Admitting: Internal Medicine

## 2021-06-14 ENCOUNTER — Ambulatory Visit (INDEPENDENT_AMBULATORY_CARE_PROVIDER_SITE_OTHER): Payer: Medicare Other | Admitting: Internal Medicine

## 2021-06-14 VITALS — BP 138/88 | HR 85 | Resp 16 | Ht 64.0 in | Wt 167.4 lb

## 2021-06-14 DIAGNOSIS — N1831 Chronic kidney disease, stage 3a: Secondary | ICD-10-CM | POA: Diagnosis not present

## 2021-06-14 DIAGNOSIS — Z0001 Encounter for general adult medical examination with abnormal findings: Secondary | ICD-10-CM | POA: Insufficient documentation

## 2021-06-14 DIAGNOSIS — I10 Essential (primary) hypertension: Secondary | ICD-10-CM

## 2021-06-14 MED ORDER — AMLODIPINE BESYLATE 5 MG PO TABS: 5.0000 mg | ORAL_TABLET | Freq: Every day | ORAL | 1 refills | Status: DC

## 2021-06-14 NOTE — Assessment & Plan Note (Signed)
Decline in renal function/GFR, likely age-related ?Advised to improve hydration ?Avoid nephrotoxic agents ?Check urine microalbumin/creatinine ratio ?

## 2021-06-14 NOTE — Patient Instructions (Signed)
Please continue taking medications as prescribed. ? ?Please maintain at least 64 ounces of fluid intake. ? ?Please consider getting Shingrix and Tdap vaccine at your local pharmacy. ?

## 2021-06-14 NOTE — Assessment & Plan Note (Signed)
BP Readings from Last 1 Encounters:  ?06/14/21 138/88  ? ?Remains well-controlled at home around 120s-130s/80s mostly ?Counseled for compliance with the medications ?Advised DASH diet and moderate exercise/walking as tolerated ? ?

## 2021-06-14 NOTE — Assessment & Plan Note (Signed)
Physical exam as documented. ?Fasting blood tests reviewed and discussed with the patient in detail. ?Refused pneumococcal vaccine today. Advised to get Shingrix and Tdap vaccine at local pharmacy. ?Refused DEXA scan. Takes Vitamin D supplement. ?

## 2021-06-14 NOTE — Progress Notes (Signed)
? ?Established Patient Office Visit ? ?Subjective:  ?Patient ID: Joanne White, female    DOB: Dec 04, 1940  Age: 81 y.o. MRN: [id number] ? ?CC:  ?Chief Complaint  ?Patient presents with  ? Annual Exam  ?  Annual exam   ? ? ?HPI ?Joanne White is a 81 y.o. female with past medical history of HTN who presents for annual physical. ? ?HTN: BP is well-controlled. Takes medications regularly. Patient denies headache, dizziness, chest pain, dyspnea or palpitations. ? ?Her CMP showed GFR at 50, which is stable from the previous blood tests. She denies dysuria, hematuria, urinary hesitance/resistance. ? ?Past Medical History:  ?Diagnosis Date  ? Abnormal weight loss   ? Acute sinusitis, unspecified   ? Allergy   ? bananas, codeine; seasonal allergies-tree pollen  ? Anemia, unspecified   ? Cholelithiasis 03/03/2014  ? -status post cholecystectomy  ? Chronic kidney disease, stage 2 (mild)   ? Disappearance and death of family member   ? Dizziness and giddiness   ? Essential (primary) hypertension   ? HTN (hypertension), benign 03/03/2014  ? Hyperkalemia   ? Hypertension   ? Hypertensive chronic kidney disease with stage 1 through stage 4 chronic kidney disease, or unspecified chronic kidney disease   ? Pneumonia, unspecified organism   ? Tinnitus, right ear   ? Vertigo   ? ? ?Past Surgical History:  ?Procedure Laterality Date  ? CHOLECYSTECTOMY  03/04/2014  ? Procedure: LAPAROSCOPIC CHOLECYSTECTOMY;  Surgeon: Leighton Ruff, MD;  Location: [facility];  Service: General;;  ? NO PAST SURGERIES    ? ? ?Family History  ?Problem Relation Age of Onset  ? Hypertension Father   ? Cancer Father   ?     lung  ? Heart attack Brother   ? Hypertension Brother   ? ? ?Social History  ? ?Socioeconomic History  ? Marital status: Single  ?  Spouse name: Not on file  ? Number of children: Not on file  ? Years of education: Not on file  ? Highest education level: Not on file  ?Occupational History  ? Occupation: retired  ?Tobacco Use  ? Smoking status:  Former  ?  Types: Cigarettes  ?  Quit date: 03/03/2011  ?  Years since quitting: 10.2  ? Smokeless tobacco: Never  ?Vaping Use  ? Vaping Use: Never used  ?Substance and Sexual Activity  ? Alcohol use: Yes  ?  Comment: 1 beer once or twice a week and a rare mixed drink  ? Drug use: No  ? Sexual activity: Not Currently  ?Other Topics Concern  ? Not on file  ?Social History Narrative  ? Not on file  ? ?Social Determinants of Health  ? ?Financial Resource Strain: Low Risk   ? Difficulty of Paying Living Expenses: Not hard at all  ?Food Insecurity: No Food Insecurity  ? Worried About Charity fundraiser in the Last Year: Never true  ? Ran Out of Food in the Last Year: Never true  ?Transportation Needs: No Transportation Needs  ? Lack of Transportation (Medical): No  ? Lack of Transportation (Non-Medical): No  ?Physical Activity: Insufficiently Active  ? Days of Exercise per Week: 3 days  ? Minutes of Exercise per Session: 40 min  ?Stress: No Stress Concern Present  ? Feeling of Stress : Not at all  ?Social Connections: Moderately Isolated  ? Frequency of Communication with Friends and Family: More than three times a week  ? Frequency of Social Gatherings with Friends  and Family: Three times a week  ? Attends Religious Services: 1 to 4 times per year  ? Active Member of Clubs or Organizations: No  ? Attends Archivist Meetings: Never  ? Marital Status: Never married  ?Intimate Partner Violence: Not At Risk  ? Fear of Current or Ex-Partner: No  ? Emotionally Abused: No  ? Physically Abused: No  ? Sexually Abused: No  ? ? ?Outpatient Medications Prior to Visit  ?Medication Sig Dispense Refill  ? meclizine (ANTIVERT) 12.5 MG tablet Take 1 tablet (12.5 mg total) by mouth 3 (three) times daily as needed for dizziness. 30 tablet 0  ? amLODipine (NORVASC) 5 MG tablet TAKE (1) TABLET BY MOUTH ONCE DAILY. 90 tablet 0  ? ?No facility-administered medications prior to visit.  ? ? ?Allergies  ?Allergen Reactions  ? Banana    ?  Stomach cramping and pain  ? Codeine Nausea And Vomiting  ? ? ?ROS ?Review of Systems  ?Constitutional:  Negative for chills and fever.  ?HENT:  Negative for congestion, sinus pressure, sinus pain and sore throat.   ?Eyes:  Negative for pain and discharge.  ?Respiratory:  Negative for cough and shortness of breath.   ?Cardiovascular:  Negative for chest pain and palpitations.  ?Gastrointestinal:  Negative for abdominal pain, constipation, diarrhea, nausea and vomiting.  ?Endocrine: Negative for polydipsia and polyuria.  ?Genitourinary:  Negative for dysuria and hematuria.  ?Musculoskeletal:  Negative for neck pain and neck stiffness.  ?Skin:  Negative for rash.  ?Neurological:  Negative for dizziness and weakness.  ?Psychiatric/Behavioral:  Negative for agitation and behavioral problems.   ? ?  ?Objective:  ?  ?Physical Exam ?Vitals reviewed.  ?Constitutional:   ?   General: She is not in acute distress. ?   Appearance: She is not diaphoretic.  ?HENT:  ?   Head: Normocephalic and atraumatic.  ?   Nose: Nose normal. No congestion.  ?   Mouth/Throat:  ?   Mouth: Mucous membranes are moist.  ?   Pharynx: No posterior oropharyngeal erythema.  ?Eyes:  ?   General: No scleral icterus. ?   Extraocular Movements: Extraocular movements intact.  ?Neck:  ?   Vascular: No carotid bruit.  ?Cardiovascular:  ?   Rate and Rhythm: Normal rate and regular rhythm.  ?   Pulses: Normal pulses.  ?   Heart sounds: Normal heart sounds. No murmur heard. ?Pulmonary:  ?   Breath sounds: Normal breath sounds. No wheezing or rales.  ?Abdominal:  ?   Palpations: Abdomen is soft.  ?   Tenderness: There is no abdominal tenderness.  ?Musculoskeletal:  ?   Cervical back: Neck supple. No tenderness.  ?   Right lower leg: No edema.  ?   Left lower leg: No edema.  ?Skin: ?   General: Skin is warm.  ?   Findings: No rash.  ?Neurological:  ?   General: No focal deficit present.  ?   Mental Status: She is alert and oriented to person, place, and time.   ?   Cranial Nerves: No cranial nerve deficit.  ?   Sensory: No sensory deficit.  ?   Motor: No weakness.  ?Psychiatric:     ?   Mood and Affect: Mood normal.     ?   Behavior: Behavior normal.  ? ? ?BP 138/88 (BP Location: Right Arm, Patient Position: Sitting, Cuff Size: Normal)   Pulse 85   Resp 16   Ht '5\' 4"'  (1.626  m)   Wt 167 lb 6.4 oz (75.9 kg)   SpO2 99%   BMI 28.73 kg/m?  ?Wt Readings from Last 3 Encounters:  ?06/14/21 167 lb 6.4 oz (75.9 kg)  ?02/14/21 166 lb 1.3 oz (75.3 kg)  ?07/06/20 159 lb (72.1 kg)  ? ? ?Lab Results  ?Component Value Date  ? TSH 3.34 09/05/2018  ? ?Lab Results  ?Component Value Date  ? WBC 7.5 06/06/2021  ? HGB 16.1 (H) 06/06/2021  ? HCT 44.4 06/06/2021  ? MCV 97 06/06/2021  ? PLT 244 06/06/2021  ? ?Lab Results  ?Component Value Date  ? NA 142 06/06/2021  ? K 5.3 (H) 06/06/2021  ? CO2 25 06/06/2021  ? GLUCOSE 97 06/06/2021  ? BUN 16 06/06/2021  ? CREATININE 1.12 (H) 06/06/2021  ? BILITOT 0.3 01/04/2021  ? ALKPHOS 111 01/04/2021  ? AST 16 01/04/2021  ? ALT 17 01/04/2021  ? PROT 6.9 01/04/2021  ? ALBUMIN 4.5 01/04/2021  ? CALCIUM 9.5 06/06/2021  ? ANIONGAP 8 08/05/2014  ? EGFR 50 (L) 06/06/2021  ? ?Lab Results  ?Component Value Date  ? CHOL 207 (H) 01/04/2021  ? ?Lab Results  ?Component Value Date  ? HDL 63 01/04/2021  ? ?Lab Results  ?Component Value Date  ? LDLCALC 122 (H) 01/04/2021  ? ?Lab Results  ?Component Value Date  ? TRIG 123 01/04/2021  ? ?Lab Results  ?Component Value Date  ? CHOLHDL 2.8 06/22/2020  ? ?Lab Results  ?Component Value Date  ? HGBA1C 5.5 06/22/2020  ? ? ?  ?Assessment & Plan:  ? ?Problem List Items Addressed This Visit   ? ?  ? Cardiovascular and Mediastinum  ? HTN (hypertension), benign (Chronic)  ?  BP Readings from Last 1 Encounters:  ?06/14/21 138/88  ?Remains well-controlled at home around 120s-130s/80s mostly ?Counseled for compliance with the medications ?Advised DASH diet and moderate exercise/walking as tolerated ? ?  ?  ? Relevant Medications  ?  amLODipine (NORVASC) 5 MG tablet  ? Other Relevant Orders  ? Basic Metabolic Panel (BMET)  ?  ? Genitourinary  ? Chronic kidney disease, stage 3a ([place])  ?  Decline in renal function/GFR, likely age-related ?

## 2021-06-16 LAB — MICROALBUMIN / CREATININE URINE RATIO
Creatinine, Urine: 40.1 mg/dL
Microalb/Creat Ratio: 37 mg/g creat — ABNORMAL HIGH (ref 0–29)
Microalbumin, Urine: 15 ug/mL

## 2021-08-22 ENCOUNTER — Other Ambulatory Visit: Payer: Self-pay | Admitting: Internal Medicine

## 2021-08-22 DIAGNOSIS — I10 Essential (primary) hypertension: Secondary | ICD-10-CM

## 2021-08-23 ENCOUNTER — Ambulatory Visit
Admission: EM | Admit: 2021-08-23 | Discharge: 2021-08-23 | Disposition: A | Discharge status: Home / Self Care | Payer: Medicare Other | Attending: Family Medicine | Admitting: Family Medicine

## 2021-08-23 DIAGNOSIS — S161XXA Strain of muscle, fascia and tendon at neck level, initial encounter: Secondary | ICD-10-CM

## 2021-08-23 MED ORDER — CYCLOBENZAPRINE HCL 5 MG PO TABS: 2.5000 mg | ORAL_TABLET | Freq: Three times a day (TID) | ORAL | 0 refills | Status: DC | PRN

## 2021-08-23 NOTE — ED Provider Notes (Signed)
[facility]    CSN: [id number] Arrival date & time: 08/23/21  1028      History   Chief Complaint Chief Complaint  Patient presents with   Shoulder Pain    HPI Joanne White is a 81 y.o. female.   Presenting today with 3-day history of left sided neck soreness that seems to run down toward her shoulder.  She states this came on after doing a lot of yard work and lifting a heavy bucket of potatoes the past few days.  She states it does not feel like a pain, particular a sharp pain but more of a stiffness and soreness.  Worse with movement of her head to the left.  Denies numbness, tingling, weakness of the upper extremities, direct injury to the area, headache, dizziness, chest pain, shortness of breath, diaphoresis, nausea, vomiting.  Trying Tylenol and heat with mild relief of symptoms.    Past Medical History:  Diagnosis Date   Abnormal weight loss    Acute sinusitis, unspecified    Allergy    bananas, codeine; seasonal allergies-tree pollen   Anemia, unspecified    Cholelithiasis 03/03/2014   -status post cholecystectomy   Chronic kidney disease, stage 2 (mild)    Disappearance and death of family member    Dizziness and giddiness    Essential (primary) hypertension    HTN (hypertension), benign 03/03/2014   Hyperkalemia    Hypertension    Hypertensive chronic kidney disease with stage 1 through stage 4 chronic kidney disease, or unspecified chronic kidney disease    Pneumonia, unspecified organism    Tinnitus, right ear    Vertigo     Patient Active Problem List   Diagnosis Date Noted   Encounter for general adult medical examination with abnormal findings 06/14/2021   Mixed hyperlipidemia 02/14/2021   Vertigo 02/27/2020   Allergy to alpha-gal 02/20/2019   Chronic kidney disease, stage 3a ([facility])    HTN (hypertension), benign 03/03/2014    Past Surgical History:  Procedure Laterality Date   CHOLECYSTECTOMY  03/04/2014   Procedure: LAPAROSCOPIC  CHOLECYSTECTOMY;  Surgeon: Romie Levee, MD;  Location: [facility];  Service: General;;   NO PAST SURGERIES      OB History   No obstetric history on file.      Home Medications    Prior to Admission medications   Medication Sig Start Date End Date Taking? Authorizing Provider  cyclobenzaprine (FLEXERIL) 5 MG tablet Take 0.5-1 tablets (2.5-5 mg total) by mouth 3 (three) times daily as needed for muscle spasms. Do not drink alcohol or drive will take this medication.  May cause drowsiness. 08/23/21  Yes Particia Nearing, PA-C  amLODipine (NORVASC) 5 MG tablet Take 1 tablet (5 mg total) by mouth daily. 08/22/21   Anabel Halon, MD  meclizine (ANTIVERT) 12.5 MG tablet Take 1 tablet (12.5 mg total) by mouth 3 (three) times daily as needed for dizziness. 11/24/20   Kerri Perches, MD    Family History Family History  Problem Relation Age of Onset   Hypertension Father    Cancer Father        lung   Heart attack Brother    Hypertension Brother     Social History Social History   Tobacco Use   Smoking status: Former    Types: Cigarettes    Quit date: 03/03/2011    Years since quitting: 10.4   Smokeless tobacco: Never  Vaping Use   Vaping Use: Never used  Substance  Use Topics   Alcohol use: Yes    Comment: 1 beer once or twice a week and a rare mixed drink   Drug use: No     Allergies   Banana and Codeine   Review of Systems Review of Systems Per HPI  Physical Exam Triage Vital Signs ED Triage Vitals  Enc Vitals Group     BP 08/23/21 1053 (!) 199/83     Pulse Rate 08/23/21 1053 62     Resp 08/23/21 1053 18     Temp 08/23/21 1053 98.4 F (36.9 C)     Temp Source 08/23/21 1053 Oral     SpO2 08/23/21 1053 98 %     Weight --      Height --      Head Circumference --      Peak Flow --      Pain Score 08/23/21 1054 3     Pain Loc --      Pain Edu? --      Excl. in [place]? --    No data found.  Updated Vital Signs BP (!) 199/83 (BP Location: Right  Arm)   Pulse 62   Temp 98.4 F (36.9 C) (Oral)   Resp 18   SpO2 98%   Visual Acuity Right Eye Distance:   Left Eye Distance:   Bilateral Distance:    Right Eye Near:   Left Eye Near:    Bilateral Near:     Physical Exam Vitals and nursing note reviewed.  Constitutional:      Appearance: Normal appearance. She is not ill-appearing.  HENT:     Head: Atraumatic.  Eyes:     Extraocular Movements: Extraocular movements intact.     Conjunctiva/sclera: Conjunctivae normal.  Cardiovascular:     Rate and Rhythm: Normal rate and regular rhythm.     Heart sounds: Normal heart sounds.  Pulmonary:     Effort: Pulmonary effort is normal.     Breath sounds: Normal breath sounds.  Musculoskeletal:        General: Tenderness present. No swelling or deformity. Normal range of motion.     Cervical back: Normal range of motion and neck supple.     Comments: No midline spinal tenderness to palpation diffusely.  Good range of motion in cervical spine.  Mild left-sided cervical paraspinal muscles tender to palpation and mild spasm.  Range of motion of the left upper extremity fully intact, grip strength full and equal bilaterally  Skin:    General: Skin is warm and dry.  Neurological:     Mental Status: She is alert and oriented to person, place, and time.  Psychiatric:        Mood and Affect: Mood normal.        Thought Content: Thought content normal.        Judgment: Judgment normal.    UC Treatments / Results  Labs (all labs ordered are listed, but only abnormal results are displayed) Labs Reviewed - No data to display  EKG   Radiology No results found.  Procedures Procedures (including critical care time)  Medications Ordered in UC Medications - No data to display  Initial Impression / Assessment and Plan / UC Course  I have reviewed the triage vital signs and the nursing notes.  Pertinent labs & imaging results that were available during my care of the patient were  reviewed by me and considered in my medical decision making (see chart for details).  We will add Flexeril to Tylenol, heat, massage and discussed lidocaine patches, rest.  Return for any worsening symptoms.  No red flag findings today.  X-ray imaging deferred with shared decision making as very low suspicion of bony abnormality.  Final Clinical Impressions(s) / UC Diagnoses   Final diagnoses:  Strain of neck muscle, initial encounter   Discharge Instructions   None    ED Prescriptions     Medication Sig Dispense Auth. Provider   cyclobenzaprine (FLEXERIL) 5 MG tablet Take 0.5-1 tablets (2.5-5 mg total) by mouth 3 (three) times daily as needed for muscle spasms. Do not drink alcohol or drive will take this medication.  May cause drowsiness. 30 tablet Particia Nearing, [place]      PDMP not reviewed this encounter.   Particia Nearing, [place] 08/23/21 1305

## 2021-08-23 NOTE — ED Triage Notes (Signed)
Pt reports left shoulder soreness x 3 days. Reports pain is worse after she cut the grass. Tylenol gives relief.

## 2021-09-28 ENCOUNTER — Other Ambulatory Visit: Payer: Self-pay | Admitting: Internal Medicine

## 2021-10-13 DIAGNOSIS — I10 Essential (primary) hypertension: Secondary | ICD-10-CM | POA: Diagnosis not present

## 2021-10-13 DIAGNOSIS — N1831 Chronic kidney disease, stage 3a: Secondary | ICD-10-CM | POA: Diagnosis not present

## 2021-10-15 LAB — VITAMIN D 25 HYDROXY (VIT D DEFICIENCY, FRACTURES): Vit D, 25-Hydroxy: 31.1 ng/mL (ref 30.0–100.0)

## 2021-10-15 LAB — BASIC METABOLIC PANEL
BUN/Creatinine Ratio: 10 — ABNORMAL LOW (ref 12–28)
BUN: 12 mg/dL (ref 8–27)
CO2: 24 mmol/L (ref 20–29)
Calcium: 9.4 mg/dL (ref 8.7–10.3)
Chloride: 104 mmol/L (ref 96–106)
Creatinine, Ser: 1.24 mg/dL — ABNORMAL HIGH (ref 0.57–1.00)
Glucose: 91 mg/dL (ref 70–99)
Potassium: 5.2 mmol/L (ref 3.5–5.2)
Sodium: 142 mmol/L (ref 134–144)
eGFR: 44 mL/min/{1.73_m2} — ABNORMAL LOW (ref >59)

## 2021-10-15 LAB — PARATHYROID HORMONE, INTACT (NO CA): PTH: 42 pg/mL (ref 15–65)

## 2021-10-19 ENCOUNTER — Telehealth: Payer: Self-pay

## 2021-10-19 NOTE — Telephone Encounter (Signed)
Called patient left voice mail on home and cell phone.

## 2021-10-19 NOTE — Telephone Encounter (Signed)
Dr Allena Katz will discuss need for labs with her at visit on 9-7 please let her know

## 2021-10-19 NOTE — Telephone Encounter (Signed)
Patient called will come by this morning to pick up blood work to go have it done.

## 2021-10-20 ENCOUNTER — Encounter: Payer: Self-pay | Admitting: Internal Medicine

## 2021-10-20 ENCOUNTER — Ambulatory Visit (INDEPENDENT_AMBULATORY_CARE_PROVIDER_SITE_OTHER): Payer: Medicare Other | Admitting: Internal Medicine

## 2021-10-20 VITALS — BP 138/82 | HR 59 | Resp 18 | Ht 63.0 in | Wt 167.6 lb

## 2021-10-20 DIAGNOSIS — L2389 Allergic contact dermatitis due to other agents: Secondary | ICD-10-CM | POA: Insufficient documentation

## 2021-10-20 DIAGNOSIS — E782 Mixed hyperlipidemia: Secondary | ICD-10-CM | POA: Diagnosis not present

## 2021-10-20 DIAGNOSIS — I10 Essential (primary) hypertension: Secondary | ICD-10-CM

## 2021-10-20 DIAGNOSIS — N1831 Chronic kidney disease, stage 3a: Secondary | ICD-10-CM

## 2021-10-20 DIAGNOSIS — Z23 Encounter for immunization: Secondary | ICD-10-CM | POA: Diagnosis not present

## 2021-10-20 MED ORDER — TRIAMCINOLONE ACETONIDE 0.1 % EX CREA: 1.0000 | TOPICAL_CREAM | Freq: Two times a day (BID) | CUTANEOUS | 0 refills | Status: DC

## 2021-10-20 NOTE — Assessment & Plan Note (Signed)
BP Readings from Last 1 Encounters:  10/20/21 138/82   Remains well-controlled at home around 120s-130s/80s mostly On Amlodipine Unable to start ACEi/ARB for now due to hyperkalemia Counseled for compliance with the medications Advised DASH diet and moderate exercise/walking as tolerated

## 2021-10-20 NOTE — Patient Instructions (Addendum)
Please take medications as prescribed.  Please continue to follow low salt diet and ambulate as tolerated.  Please apply Kenalog cream over back rash. Okay to take Benadryl for rash.  Please get fasting blood tests done before the next visit.  Please consider getting Shingrix and Tdap vaccines at local pharmacy.

## 2021-10-20 NOTE — Progress Notes (Signed)
Established Patient Office Visit  Subjective:  Patient ID: Joanne White, female    DOB: 03-30-1940  Age: 81 y.o. MRN: [id number]  CC:  Chief Complaint  Patient presents with   Follow-up    Follow up pt has rash on back it was itching bad but has since been doing better     HPI Joanne White is a 81 y.o. female with past medical history of HTN and CKD who presents for f/u of her chronic medical conditions.  HTN: BP is well-controlled. Takes medications regularly. Patient denies headache, dizziness, chest pain, dyspnea or palpitations.   Her CMP showed GFR at 44, which is decreased from 50 from the previous blood tests. She denies dysuria, hematuria, urinary hesitance/resistance.  She stated that she takes about 3-4 bottles of water a day - about 50-60 ounces of fluid in a day.  She also reports itching rash over her back, which is red and spotty.  She denies any recent change in soap, shampoo, lotion, cleaning supplies or any new chemical exposure.  Past Medical History:  Diagnosis Date   Abnormal weight loss    Acute sinusitis, unspecified    Allergy    bananas, codeine; seasonal allergies-tree pollen   Anemia, unspecified    Cholelithiasis 03/03/2014   -status post cholecystectomy   Chronic kidney disease, stage 2 (mild)    Disappearance and death of family member    Dizziness and giddiness    Essential (primary) hypertension    HTN (hypertension), benign 03/03/2014   Hyperkalemia    Hypertension    Hypertensive chronic kidney disease with stage 1 through stage 4 chronic kidney disease, or unspecified chronic kidney disease    Pneumonia, unspecified organism    Tinnitus, right ear    Vertigo     Past Surgical History:  Procedure Laterality Date   CHOLECYSTECTOMY  03/04/2014   Procedure: LAPAROSCOPIC CHOLECYSTECTOMY;  Surgeon: Leighton Ruff, MD;  Location: [facility];  Service: General;;   NO PAST SURGERIES      Family History  Problem Relation Age of Onset    Hypertension Father    Cancer Father        lung   Heart attack Brother    Hypertension Brother     Social History   Socioeconomic History   Marital status: Single    Spouse name: Not on file   Number of children: Not on file   Years of education: Not on file   Highest education level: Not on file  Occupational History   Occupation: retired  Tobacco Use   Smoking status: Former    Types: Cigarettes    Quit date: 03/03/2011    Years since quitting: 10.6   Smokeless tobacco: Never  Vaping Use   Vaping Use: Never used  Substance and Sexual Activity   Alcohol use: Yes    Comment: 1 beer once or twice a week and a rare mixed drink   Drug use: No   Sexual activity: Not Currently  Other Topics Concern   Not on file  Social History Narrative   Not on file   Social Determinants of Health   Financial Resource Strain: Low Risk  (04/11/2021)   Overall Financial Resource Strain (CARDIA)    Difficulty of Paying Living Expenses: Not hard at all  Food Insecurity: No Food Insecurity (04/11/2021)   Hunger Vital Sign    Worried About Running Out of Food in the Last Year: Never true    Ran Out of  Food in the Last Year: Never true  Transportation Needs: No Transportation Needs (04/11/2021)   PRAPARE - Hydrologist (Medical): No    Lack of Transportation (Non-Medical): No  Physical Activity: Insufficiently Active (04/11/2021)   Exercise Vital Sign    Days of Exercise per Week: 3 days    Minutes of Exercise per Session: 40 min  Stress: No Stress Concern Present (04/11/2021)   [place]    Feeling of Stress : Not at all  Social Connections: Moderately Isolated (04/11/2021)   Social Connection and Isolation Panel [NHANES]    Frequency of Communication with Friends and Family: More than three times a week    Frequency of Social Gatherings with Friends and Family: Three times a week    Attends  Religious Services: 1 to 4 times per year    Active Member of Clubs or Organizations: No    Attends Archivist Meetings: Never    Marital Status: Never married  Intimate Partner Violence: Not At Risk (04/11/2021)   Humiliation, Afraid, Rape, and Kick questionnaire    Fear of Current or Ex-Partner: No    Emotionally Abused: No    Physically Abused: No    Sexually Abused: No    Outpatient Medications Prior to Visit  Medication Sig Dispense Refill   amLODipine (NORVASC) 5 MG tablet Take 1 tablet (5 mg total) by mouth daily. 90 tablet 0   meclizine (ANTIVERT) 12.5 MG tablet TAKE (1) TABLET BY MOUTH THREE TIMES DAILY AS NEEDED. 30 tablet 0   cyclobenzaprine (FLEXERIL) 5 MG tablet Take 0.5-1 tablets (2.5-5 mg total) by mouth 3 (three) times daily as needed for muscle spasms. Do not drink alcohol or drive will take this medication.  May cause drowsiness. (Patient not taking: Reported on 10/20/2021) 30 tablet 0   No facility-administered medications prior to visit.    Allergies  Allergen Reactions   Banana     Stomach cramping and pain   Codeine Nausea And Vomiting    ROS Review of Systems  Constitutional:  Negative for chills and fever.  HENT:  Negative for congestion, sinus pressure, sinus pain and sore throat.   Eyes:  Negative for pain and discharge.  Respiratory:  Negative for cough and shortness of breath.   Cardiovascular:  Negative for chest pain and palpitations.  Gastrointestinal:  Negative for abdominal pain, constipation, diarrhea, nausea and vomiting.  Endocrine: Negative for polydipsia and polyuria.  Genitourinary:  Negative for dysuria and hematuria.  Musculoskeletal:  Negative for neck pain and neck stiffness.  Skin:  Positive for rash.  Neurological:  Negative for dizziness and weakness.  Psychiatric/Behavioral:  Negative for agitation and behavioral problems.       Objective:    Physical Exam Vitals reviewed.  Constitutional:      General: She is not  in acute distress.    Appearance: She is not diaphoretic.  HENT:     Head: Normocephalic and atraumatic.     Nose: Nose normal. No congestion.     Mouth/Throat:     Mouth: Mucous membranes are moist.     Pharynx: No posterior oropharyngeal erythema.  Eyes:     General: No scleral icterus.    Extraocular Movements: Extraocular movements intact.  Neck:     Vascular: No carotid bruit.  Cardiovascular:     Rate and Rhythm: Normal rate and regular rhythm.     Pulses: Normal pulses.  Heart sounds: Normal heart sounds. No murmur heard. Pulmonary:     Breath sounds: Normal breath sounds. No wheezing or rales.  Abdominal:     Palpations: Abdomen is soft.     Tenderness: There is no abdominal tenderness.  Musculoskeletal:     Cervical back: Neck supple. No tenderness.     Right lower leg: No edema.     Left lower leg: No edema.  Skin:    General: Skin is warm.     Findings: Rash (Erythematous papules over back) present.  Neurological:     General: No focal deficit present.     Mental Status: She is alert and oriented to person, place, and time.     Sensory: No sensory deficit.     Motor: No weakness.  Psychiatric:        Mood and Affect: Mood normal.        Behavior: Behavior normal.     BP 138/82 (BP Location: Right Arm, Patient Position: Sitting, Cuff Size: Normal)   Pulse (!) 59   Resp 18   Ht '5\' 3"'  (1.6 m)   Wt 167 lb 9.6 oz (76 kg)   SpO2 98%   BMI 29.69 kg/m  Wt Readings from Last 3 Encounters:  10/20/21 167 lb 9.6 oz (76 kg)  06/14/21 167 lb 6.4 oz (75.9 kg)  02/14/21 166 lb 1.3 oz (75.3 kg)    Lab Results  Component Value Date   TSH 3.34 09/05/2018   Lab Results  Component Value Date   WBC 7.5 06/06/2021   HGB 16.1 (H) 06/06/2021   HCT 44.4 06/06/2021   MCV 97 06/06/2021   PLT 244 06/06/2021   Lab Results  Component Value Date   NA 142 10/13/2021   K 5.2 10/13/2021   CO2 24 10/13/2021   GLUCOSE 91 10/13/2021   BUN 12 10/13/2021   CREATININE  1.24 (H) 10/13/2021   BILITOT 0.3 01/04/2021   ALKPHOS 111 01/04/2021   AST 16 01/04/2021   ALT 17 01/04/2021   PROT 6.9 01/04/2021   ALBUMIN 4.5 01/04/2021   CALCIUM 9.4 10/13/2021   ANIONGAP 8 08/05/2014   EGFR 44 (L) 10/13/2021   Lab Results  Component Value Date   CHOL 207 (H) 01/04/2021   Lab Results  Component Value Date   HDL 63 01/04/2021   Lab Results  Component Value Date   LDLCALC 122 (H) 01/04/2021   Lab Results  Component Value Date   TRIG 123 01/04/2021   Lab Results  Component Value Date   CHOLHDL 2.8 06/22/2020   Lab Results  Component Value Date   HGBA1C 5.5 06/22/2020      Assessment & Plan:   Problem List Items Addressed This Visit       Cardiovascular and Mediastinum   HTN (hypertension), benign (Chronic)    BP Readings from Last 1 Encounters:  10/20/21 138/82  Remains well-controlled at home around 120s-130s/80s mostly On Amlodipine Unable to start ACEi/ARB for now due to hyperkalemia Counseled for compliance with the medications Advised DASH diet and moderate exercise/walking as tolerated       Relevant Orders   TSH     Musculoskeletal and Integument   Allergic contact dermatitis due to other agents    Rash over back likely contact dermatitis (plant related) Kenalog cream      Relevant Medications   triamcinolone cream (KENALOG) 0.1 %     Genitourinary   Chronic kidney disease, stage 3a ([place]) - Primary  Decline in renal function/GFR, likely age-related Advised to improve hydration Avoid nephrotoxic agents Checked urine microalbumin/creatinine ratio - unable to start ACEi/ARB due to hyperkalemia If further decline in GFR, will refer to Nephrology      Relevant Orders   TSH   CMP14+EGFR   CBC with Differential/Platelet     Other   Mixed hyperlipidemia    Lipid profile reviewed Advised to follow DASH diet for now      Relevant Orders   Lipid panel   Other Visit Diagnoses     Need for immunization against  influenza       Relevant Orders   Flu Vaccine QUAD High Dose(Fluad) (Completed)       Meds ordered this encounter  Medications   triamcinolone cream (KENALOG) 0.1 %    Sig: Apply 1 Application topically 2 (two) times daily.    Dispense:  30 g    Refill:  0    Follow-up: Return in about 4 months (around 02/19/2022) for HTN and CKD.    Lindell Spar, MD

## 2021-10-20 NOTE — Assessment & Plan Note (Addendum)
Decline in renal function/GFR, likely age-related Advised to improve hydration Avoid nephrotoxic agents Checked urine microalbumin/creatinine ratio - unable to start ACEi/ARB due to hyperkalemia If further decline in GFR, will refer to Nephrology

## 2021-10-20 NOTE — Assessment & Plan Note (Signed)
Lipid profile reviewed Advised to follow DASH diet for now 

## 2021-10-21 NOTE — Assessment & Plan Note (Signed)
Rash over back likely contact dermatitis (plant related) Kenalog cream

## 2021-11-26 ENCOUNTER — Encounter: Payer: Self-pay | Admitting: Emergency Medicine

## 2021-11-26 ENCOUNTER — Ambulatory Visit
Admission: EM | Admit: 2021-11-26 | Discharge: 2021-11-26 | Disposition: A | Discharge status: Home / Self Care | Payer: Medicare Other | Attending: Urgent Care | Admitting: Urgent Care

## 2021-11-26 DIAGNOSIS — R058 Other specified cough: Secondary | ICD-10-CM | POA: Diagnosis not present

## 2021-11-26 DIAGNOSIS — R067 Sneezing: Secondary | ICD-10-CM | POA: Diagnosis not present

## 2021-11-26 DIAGNOSIS — J309 Allergic rhinitis, unspecified: Secondary | ICD-10-CM | POA: Diagnosis not present

## 2021-11-26 MED ORDER — PREDNISONE 10 MG PO TABS: 30.0000 mg | ORAL_TABLET | Freq: Every day | ORAL | 0 refills | Status: DC

## 2021-11-26 NOTE — ED Provider Notes (Signed)
[facility]  Note:  This document was prepared using Dragon voice recognition software and may include unintentional dictation errors.  MRN: [id number] DOB: May 05, 1940  Subjective:   Joanne White is a 81 y.o. female presenting for 2-day history of persistent sneezing, dry coughing, drainage.  Symptoms started after she spent the day outdoors, working on her yard and trimming some shrubs.  Patient has known history of allergic rhinitis and is taking cetirizine daily.  She does not have much concern for COVID-19 and plans on testing herself at home.  No chest pain, shortness of breath or wheezing.  No history of asthma.  She does have a history of CKD stage IIIa.  No smoking.  No current facility-administered medications for this encounter.  Current Outpatient Medications:    amLODipine (NORVASC) 5 MG tablet, Take 1 tablet (5 mg total) by mouth daily., Disp: 90 tablet, Rfl: 0   meclizine (ANTIVERT) 12.5 MG tablet, TAKE (1) TABLET BY MOUTH THREE TIMES DAILY AS NEEDED., Disp: 30 tablet, Rfl: 0   triamcinolone cream (KENALOG) 0.1 %, Apply 1 Application topically 2 (two) times daily., Disp: 30 g, Rfl: 0   Allergies  Allergen Reactions   Banana     Stomach cramping and pain   Codeine Nausea And Vomiting    Past Medical History:  Diagnosis Date   Abnormal weight loss    Acute sinusitis, unspecified    Allergy    bananas, codeine; seasonal allergies-tree pollen   Anemia, unspecified    Cholelithiasis 03/03/2014   -status post cholecystectomy   Chronic kidney disease, stage 2 (mild)    Disappearance and death of family member    Dizziness and giddiness    Essential (primary) hypertension    HTN (hypertension), benign 03/03/2014   Hyperkalemia    Hypertension    Hypertensive chronic kidney disease with stage 1 through stage 4 chronic kidney disease, or unspecified chronic kidney disease    Pneumonia, unspecified organism    Tinnitus, right ear    Vertigo       Past Surgical History:  Procedure Laterality Date   CHOLECYSTECTOMY  03/04/2014   Procedure: LAPAROSCOPIC CHOLECYSTECTOMY;  Surgeon: Leighton Ruff, MD;  Location: [facility];  Service: General;;   NO PAST SURGERIES      Family History  Problem Relation Age of Onset   Hypertension Father    Cancer Father        lung   Heart attack Brother    Hypertension Brother     Social History   Tobacco Use   Smoking status: Former    Types: Cigarettes    Quit date: 03/03/2011    Years since quitting: 10.7   Smokeless tobacco: Never  Vaping Use   Vaping Use: Never used  Substance Use Topics   Alcohol use: Yes    Comment: 1 beer once or twice a week and a rare mixed drink   Drug use: No    ROS   Objective:   Vitals: BP (!) 153/79 (BP Location: Right Arm)   Pulse 86   Temp 97.9 F (36.6 C) (Oral)   Resp 18   SpO2 96%   Physical Exam Constitutional:      General: She is not in acute distress.    Appearance: Normal appearance. She is well-developed and normal weight. She is not ill-appearing, toxic-appearing or diaphoretic.  HENT:     Head: Normocephalic and atraumatic.     Right Ear: Tympanic membrane, ear canal and external ear normal.  No drainage or tenderness. No middle ear effusion. There is no impacted cerumen. Tympanic membrane is not erythematous.     Left Ear: Tympanic membrane, ear canal and external ear normal. No drainage or tenderness.  No middle ear effusion. There is no impacted cerumen. Tympanic membrane is not erythematous.     Nose: Congestion present. No rhinorrhea.     Mouth/Throat:     Mouth: Mucous membranes are moist. No oral lesions.     Pharynx: No pharyngeal swelling, oropharyngeal exudate, posterior oropharyngeal erythema or uvula swelling.     Tonsils: No tonsillar exudate or tonsillar abscesses.  Eyes:     General: No scleral icterus.       Right eye: No discharge.        Left eye: No discharge.     Extraocular Movements: Extraocular movements  intact.     Right eye: Normal extraocular motion.     Left eye: Normal extraocular motion.     Conjunctiva/sclera: Conjunctivae normal.  Cardiovascular:     Rate and Rhythm: Normal rate and regular rhythm.     Heart sounds: Normal heart sounds. No murmur heard.    No friction rub. No gallop.  Pulmonary:     Effort: Pulmonary effort is normal. No respiratory distress.     Breath sounds: No stridor. No wheezing, rhonchi or rales.  Chest:     Chest wall: No tenderness.  Musculoskeletal:     Cervical back: Normal range of motion and neck supple.  Lymphadenopathy:     Cervical: No cervical adenopathy.  Skin:    General: Skin is warm and dry.  Neurological:     General: No focal deficit present.     Mental Status: She is alert and oriented to person, place, and time.  Psychiatric:        Mood and Affect: Mood normal.        Behavior: Behavior normal.      Assessment and Plan :   PDMP not reviewed this encounter.  1. Allergic rhinitis, unspecified seasonality, unspecified trigger   2. Sneezing   3. Dry cough     Patient primarily wanted help with her allergies and has done well with prednisone in the past.  She declined a COVID test.  Plans on doing one at home.  Recommended recheck in clinic should she test positive.  Deferred imaging given clear cardiopulmonary exam, hemodynamically stable vital signs. Counseled patient on potential for adverse effects with medications prescribed/recommended today, ER and return-to-clinic precautions discussed, patient verbalized understanding.    Jaynee Eagles, [place] 11/26/21 [phone]

## 2021-11-26 NOTE — ED Triage Notes (Signed)
Sneezing, dry cough x 2 days.  Started after trimming shrubs.

## 2021-12-01 ENCOUNTER — Ambulatory Visit (INDEPENDENT_AMBULATORY_CARE_PROVIDER_SITE_OTHER): Payer: Medicare Other | Admitting: Family Medicine

## 2021-12-01 DIAGNOSIS — E782 Mixed hyperlipidemia: Secondary | ICD-10-CM | POA: Diagnosis not present

## 2021-12-01 DIAGNOSIS — D582 Other hemoglobinopathies: Secondary | ICD-10-CM | POA: Insufficient documentation

## 2021-12-01 DIAGNOSIS — I10 Essential (primary) hypertension: Secondary | ICD-10-CM

## 2021-12-01 DIAGNOSIS — N1831 Chronic kidney disease, stage 3a: Secondary | ICD-10-CM

## 2021-12-01 MED ORDER — AMLODIPINE BESYLATE 5 MG PO TABS: 5.0000 mg | ORAL_TABLET | Freq: Every day | ORAL | 3 refills | Status: DC

## 2021-12-01 NOTE — Assessment & Plan Note (Addendum)
Stable. Needs close monitoring. Stays well hydrated. Will reassess with labs in January.

## 2021-12-01 NOTE — Assessment & Plan Note (Signed)
Mild. No pharmacotherapy at this time.

## 2021-12-01 NOTE — Patient Instructions (Signed)
Continue your medication.  Follow up in 3 months.  Take care  Dr. Layten Aiken  

## 2021-12-01 NOTE — Progress Notes (Signed)
Subjective:  Patient ID: Joanne White, female    DOB: 1940-03-17  Age: 81 y.o. MRN: [id number]  CC: Chief Complaint  Patient presents with   Establish Care    Recent visit to urgent care for allergies -just finished prednisone- no problems or concerns    HPI:  80 year old female with HTN, CKD, HLD, Alpha gal allergy presents to establish care.  Patient's hypertension is well controlled on Norvasc. Doing well at this time.  She is curious about the new RSV vaccine and would like to discuss today.  She has CKD and is curious to the reason behind the CKD.  Advised that HTN and age are the current issues/causes in her circumstance.  Last GFR 44, Creatinine 1.24.   Discussed preventative health care items today. These were updated in the health maintenance section.  Patient Active Problem List   Diagnosis Date Noted   Elevated hemoglobin ([place]) 12/01/2021   Allergic contact dermatitis due to other agents 10/20/2021   Mixed hyperlipidemia 02/14/2021   Allergy to alpha-gal 02/20/2019   Chronic kidney disease, stage 3a ([facility])    HTN (hypertension), benign 03/03/2014    Social Hx   Social History   Socioeconomic History   Marital status: Single    Spouse name: Not on file   Number of children: Not on file   Years of education: Not on file   Highest education level: Not on file  Occupational History   Occupation: retired  Tobacco Use   Smoking status: Former    Types: Cigarettes    Quit date: 03/03/2011    Years since quitting: 10.7   Smokeless tobacco: Never  Vaping Use   Vaping Use: Never used  Substance and Sexual Activity   Alcohol use: Yes    Comment: 1 beer once or twice a week and a rare mixed drink   Drug use: No   Sexual activity: Not Currently  Other Topics Concern   Not on file  Social History Narrative   Not on file   Social Determinants of Health   Financial Resource Strain: Low Risk  (04/11/2021)   Overall Financial Resource Strain (CARDIA)     Difficulty of Paying Living Expenses: Not hard at all  Food Insecurity: No Food Insecurity (04/11/2021)   Hunger Vital Sign    Worried About Running Out of Food in the Last Year: Never true    Ran Out of Food in the Last Year: Never true  Transportation Needs: No Transportation Needs (04/11/2021)   PRAPARE - Hydrologist (Medical): No    Lack of Transportation (Non-Medical): No  Physical Activity: Insufficiently Active (04/11/2021)   Exercise Vital Sign    Days of Exercise per Week: 3 days    Minutes of Exercise per Session: 40 min  Stress: No Stress Concern Present (04/11/2021)   [place]    Feeling of Stress : Not at all  Social Connections: Moderately Isolated (04/11/2021)   Social Connection and Isolation Panel [NHANES]    Frequency of Communication with Friends and Family: More than three times a week    Frequency of Social Gatherings with Friends and Family: Three times a week    Attends Religious Services: 1 to 4 times per year    Active Member of Clubs or Organizations: No    Attends Archivist Meetings: Never    Marital Status: Never married    Review of Systems  Constitutional: Negative.   Respiratory: Negative.    Cardiovascular: Negative.   Gastrointestinal: Negative.     Objective:  BP 132/84   Ht 5\' 3"  (1.6 m)   Wt 167 lb 3.2 oz (75.8 kg)   BMI 29.62 kg/m      12/01/2021    1:09 PM 11/26/2021    9:41 AM 10/20/2021    9:53 AM  BP/Weight  Systolic BP [id number]  Diastolic BP 84 79 82  Wt. (Lbs) 167.2  167.6  BMI 29.62 kg/m2  29.69 kg/m2    Physical Exam Vitals and nursing note reviewed.  Constitutional:      General: She is not in acute distress.    Appearance: Normal appearance.  HENT:     Head: Normocephalic and atraumatic.     Mouth/Throat:     Pharynx: Oropharynx is clear.  Eyes:     General:        Right eye: No discharge.        Left  eye: No discharge.     Conjunctiva/sclera: Conjunctivae normal.  Cardiovascular:     Rate and Rhythm: Normal rate and regular rhythm.  Pulmonary:     Effort: Pulmonary effort is normal.     Breath sounds: Normal breath sounds. No wheezing, rhonchi or rales.  Abdominal:     General: There is no distension.     Palpations: Abdomen is soft.     Tenderness: There is no abdominal tenderness.  Neurological:     Mental Status: She is alert.  Psychiatric:        Mood and Affect: Mood normal.        Behavior: Behavior normal.     Lab Results  Component Value Date   WBC 7.5 06/06/2021   HGB 16.1 (H) 06/06/2021   HCT 44.4 06/06/2021   PLT 244 06/06/2021   GLUCOSE 91 10/13/2021   CHOL 207 (H) 01/04/2021   TRIG 123 01/04/2021   HDL 63 01/04/2021   LDLCALC 122 (H) 01/04/2021   ALT 17 01/04/2021   AST 16 01/04/2021   NA 142 10/13/2021   K 5.2 10/13/2021   CL 104 10/13/2021   CREATININE 1.24 (H) 10/13/2021   BUN 12 10/13/2021   CO2 24 10/13/2021   TSH 3.34 09/05/2018   INR 1.06 03/04/2014   HGBA1C 5.5 06/22/2020     Assessment & Plan:   Problem List Items Addressed This Visit       Cardiovascular and Mediastinum   HTN (hypertension), benign (Chronic)    Well controlled. Continue Norvasc.       Relevant Medications   amLODipine (NORVASC) 5 MG tablet     Genitourinary   Chronic kidney disease, stage 3a ([facility])    Stable. Needs close monitoring. Stays well hydrated. Will reassess with labs in January.         Other   Mixed hyperlipidemia    Mild. No pharmacotherapy at this time.      Relevant Medications   amLODipine (NORVASC) 5 MG tablet    Follow-up:  Return in about 3 months (around 03/03/2022).  [place]

## 2021-12-01 NOTE — Assessment & Plan Note (Signed)
Well controlled. Continue Norvasc. 

## 2022-01-06 IMAGING — DX DG FOOT COMPLETE 3+V*L*
3 series · 3 of 3 positions shown · non-contrast
Comparison: 04/14/2019.

CLINICAL DATA: Medial foot pain.  Tripped today.

EXAM:
LEFT FOOT - COMPLETE 3+ VIEW

[foot ap]
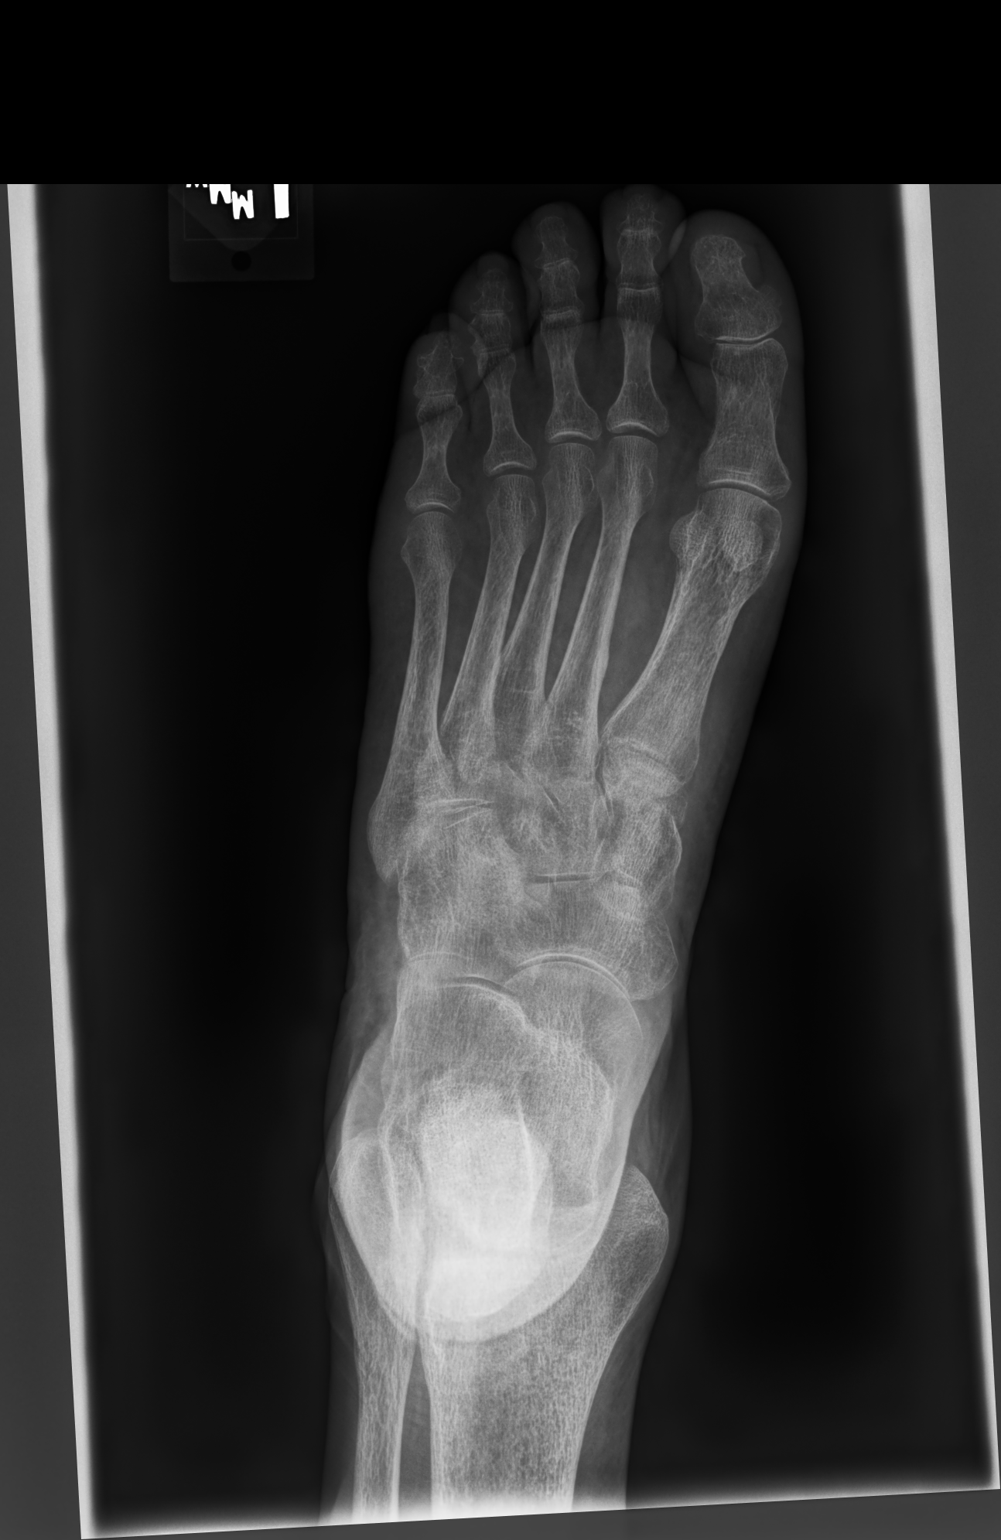

[foot mlo]
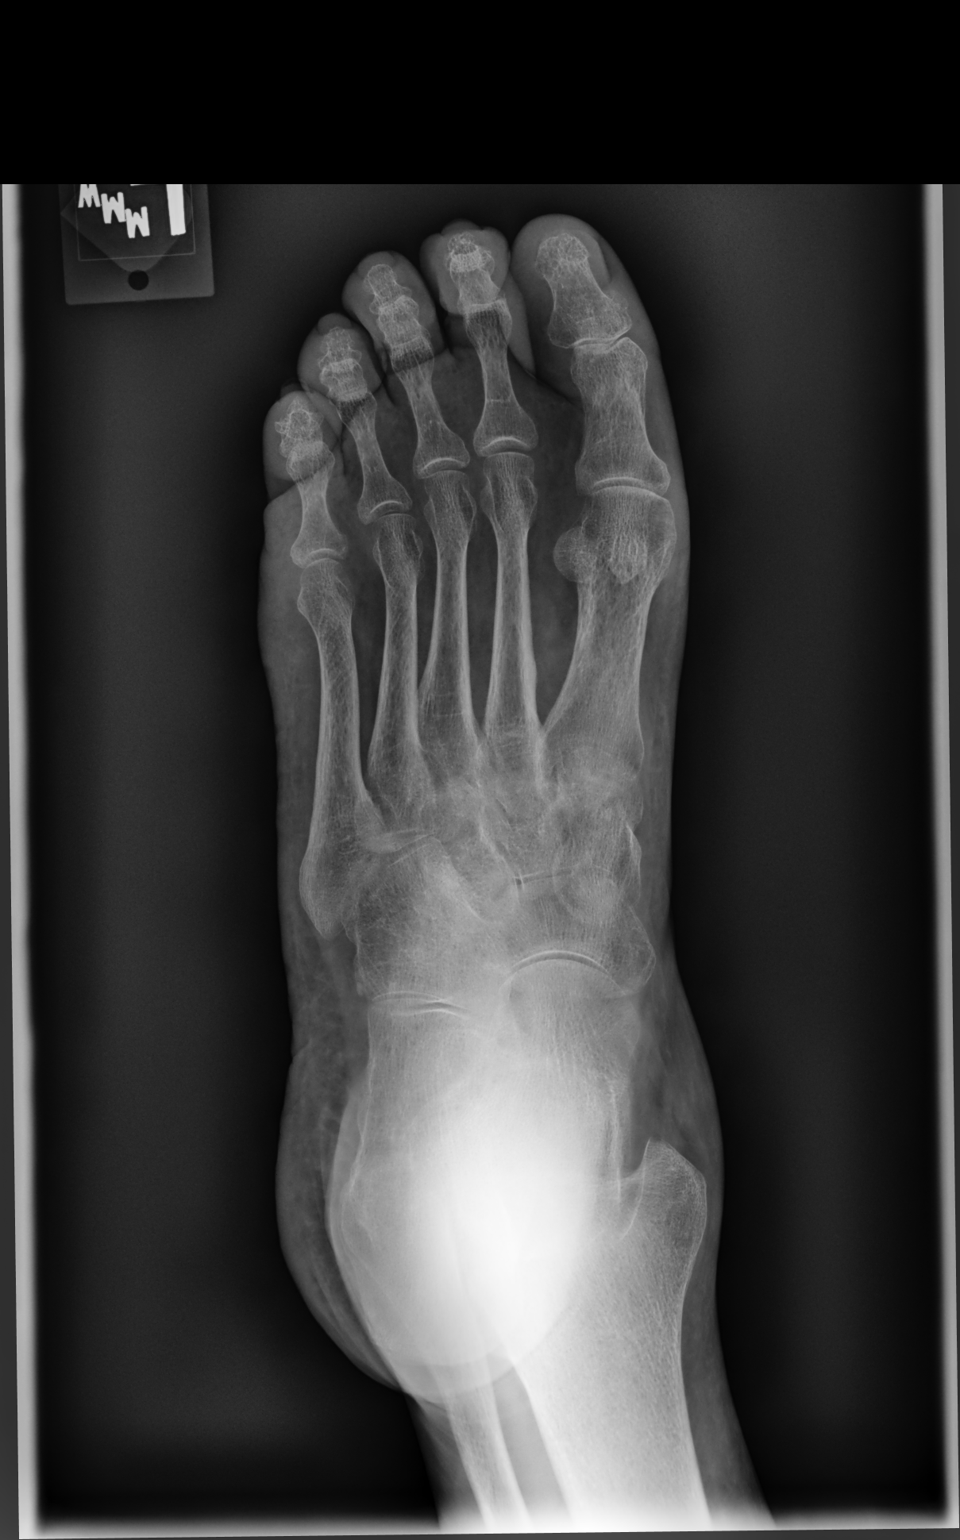

[foot lat]
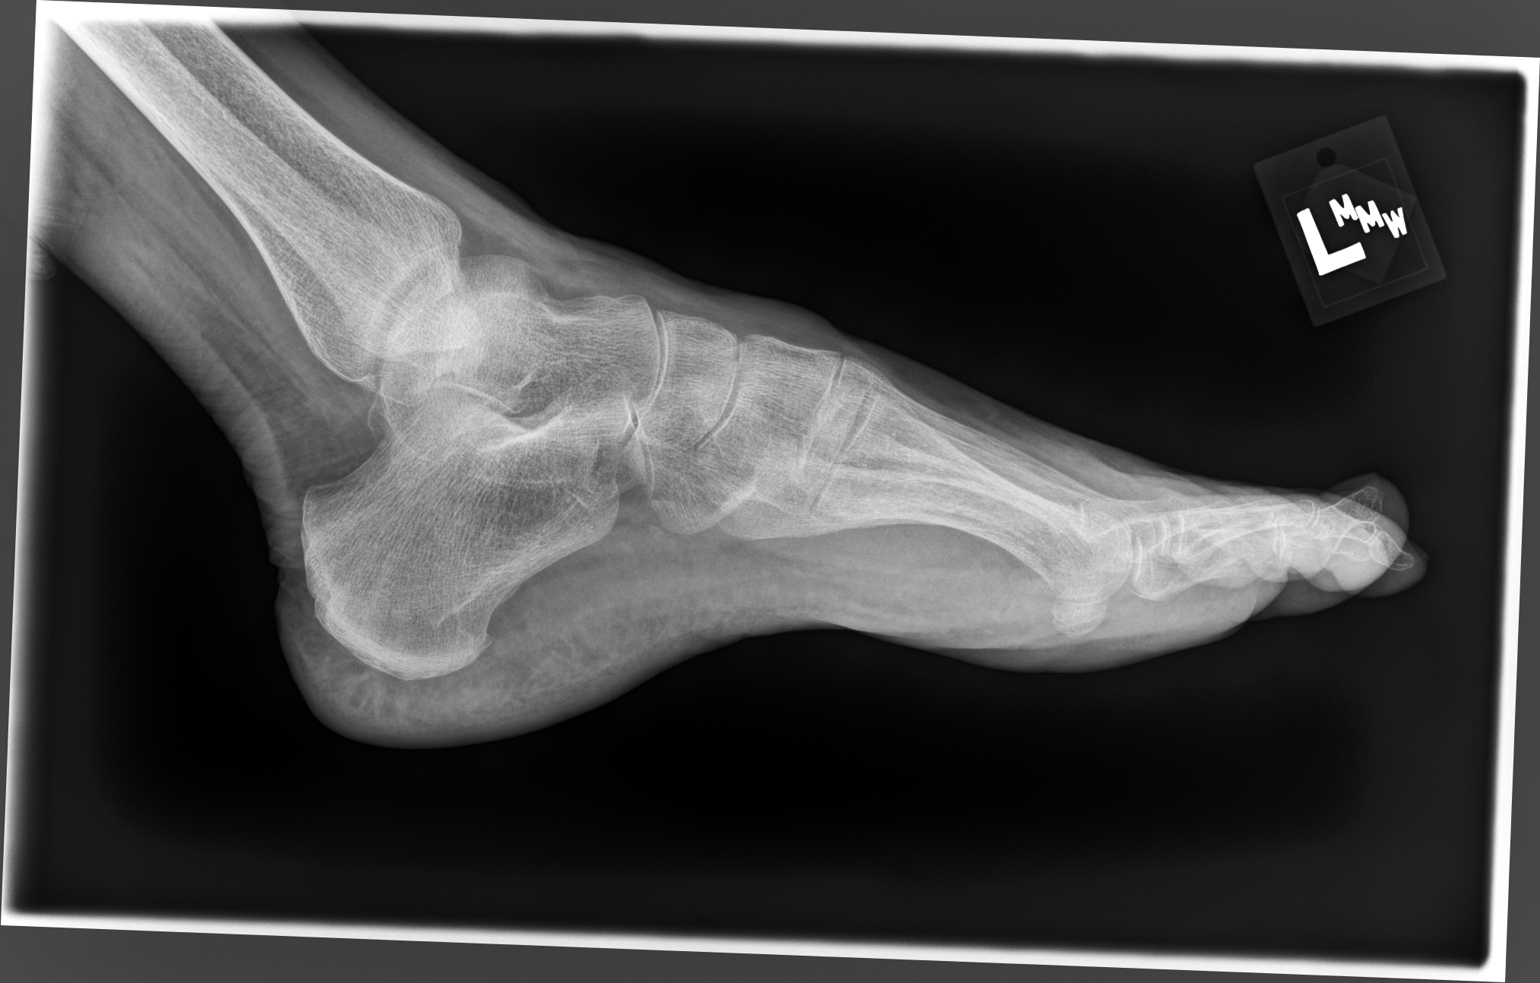

[3 of 3 positions shown; findings below may reference images not displayed]

FINDINGS: Diffuse osteopenia again noted. No acute or focal bony or joint
abnormality. No evidence of fracture or dislocation. No radiopaque
foreign body.
IMPRESSION: Diffuse osteopenia.  No acute abnormality.

## 2022-02-17 ENCOUNTER — Other Ambulatory Visit: Payer: Self-pay | Admitting: Internal Medicine

## 2022-02-17 ENCOUNTER — Other Ambulatory Visit: Payer: Self-pay | Admitting: Family Medicine

## 2022-02-20 ENCOUNTER — Ambulatory Visit: Payer: Medicare Other | Admitting: Internal Medicine

## 2022-03-06 ENCOUNTER — Encounter: Payer: Self-pay | Admitting: Family Medicine

## 2022-03-06 ENCOUNTER — Ambulatory Visit (INDEPENDENT_AMBULATORY_CARE_PROVIDER_SITE_OTHER): Payer: Medicare Other | Admitting: Family Medicine

## 2022-03-06 VITALS — BP 180/90 | HR 72 | Temp 97.2°F | Wt 164.6 lb

## 2022-03-06 DIAGNOSIS — N1831 Chronic kidney disease, stage 3a: Secondary | ICD-10-CM | POA: Diagnosis not present

## 2022-03-06 DIAGNOSIS — E782 Mixed hyperlipidemia: Secondary | ICD-10-CM

## 2022-03-06 DIAGNOSIS — D582 Other hemoglobinopathies: Secondary | ICD-10-CM

## 2022-03-06 DIAGNOSIS — I160 Hypertensive urgency: Secondary | ICD-10-CM | POA: Diagnosis not present

## 2022-03-06 MED ORDER — AMLODIPINE BESYLATE 10 MG PO TABS: 10.0000 mg | ORAL_TABLET | Freq: Every day | ORAL | 3 refills | Status: DC

## 2022-03-06 NOTE — Assessment & Plan Note (Signed)
BP markedly elevated here today.  On clear the reason why.  Was repeated on several occasions and continued to be elevated.  Patient has drank a lot of coffee this morning.  I suspect that this will elevate blood pressure some but not to the extent that we see her today.  Labs for further evaluation.  Patient is asymptomatic at this time no chest pain shortness of breath vision changes.  She is feeling well.  I have increased her amlodipine.  She will follow-up later this week for a BP check.

## 2022-03-06 NOTE — Patient Instructions (Signed)
Labs today.  Check BP at home.  Follow up later this week for re-evaluation.  Take care  Dr. Lacinda Axon

## 2022-03-06 NOTE — Progress Notes (Signed)
Subjective:  Patient ID: Joanne White, female    DOB: 01-14-1941  Age: 82 y.o. MRN: [id number]  CC: Chief Complaint  Patient presents with   Hypertension    Pt checking blood pressure at home and it has been stable. No s/s HTN. Taking Amlodipine 5 mg each night.     HPI:  82 year old female with hypertension, chronic kidney disease, elevated hemoglobin, hyperlipidemia presents for follow-up.  Patient's blood pressure is markedly elevated here.  She states that her blood pressure before coming into the clinic was in the 322G systolic.  She is compliant with amlodipine.  She states that she is feeling well.  No chest pain or shortness of breath.  Patient is in need of repeat labs.  Patient Active Problem List   Diagnosis Date Noted   Hypertensive urgency 03/06/2022   Elevated hemoglobin (HCC) 12/01/2021   Allergic contact dermatitis due to other agents 10/20/2021   Mixed hyperlipidemia 02/14/2021   Allergy to alpha-gal 02/20/2019   Chronic kidney disease, stage 3a ([place])     Social Hx   Social History   Socioeconomic History   Marital status: Single    Spouse name: Not on file   Number of children: Not on file   Years of education: Not on file   Highest education level: Not on file  Occupational History   Occupation: retired  Tobacco Use   Smoking status: Former    Types: Cigarettes    Quit date: 03/03/2011    Years since quitting: 11.0   Smokeless tobacco: Never  Vaping Use   Vaping Use: Never used  Substance and Sexual Activity   Alcohol use: Yes    Comment: 1 beer once or twice a week and a rare mixed drink   Drug use: No   Sexual activity: Not Currently  Other Topics Concern   Not on file  Social History Narrative   Not on file   Social Determinants of Health   Financial Resource Strain: Low Risk  (04/11/2021)   Overall Financial Resource Strain (CARDIA)    Difficulty of Paying Living Expenses: Not hard at all  Food Insecurity: No Food Insecurity  (04/11/2021)   Hunger Vital Sign    Worried About Running Out of Food in the Last Year: Never true    Ran Out of Food in the Last Year: Never true  Transportation Needs: No Transportation Needs (04/11/2021)   PRAPARE - Hydrologist (Medical): No    Lack of Transportation (Non-Medical): No  Physical Activity: Insufficiently Active (04/11/2021)   Exercise Vital Sign    Days of Exercise per Week: 3 days    Minutes of Exercise per Session: 40 min  Stress: No Stress Concern Present (04/11/2021)   [place]    Feeling of Stress : Not at all  Social Connections: Moderately Isolated (04/11/2021)   Social Connection and Isolation Panel [NHANES]    Frequency of Communication with Friends and Family: More than three times a week    Frequency of Social Gatherings with Friends and Family: Three times a week    Attends Religious Services: 1 to 4 times per year    Active Member of Clubs or Organizations: No    Attends Archivist Meetings: Never    Marital Status: Never married    Review of Systems Per HPI  Objective:  BP (!) 180/90   Pulse 72   Temp (!) 97.2 F (  36.2 C)   Wt 164 lb 9.6 oz (74.7 kg)   SpO2 99%   BMI 29.16 kg/m      03/06/2022   10:34 AM 12/01/2021    1:09 PM 11/26/2021    9:41 AM  BP/Weight  Systolic BP 409 735 329  Diastolic BP 90 84 79  Wt. (Lbs) 164.6 167.2   BMI 29.16 kg/m2 29.62 kg/m2     Physical Exam Vitals and nursing note reviewed.  Constitutional:      Appearance: Normal appearance.  HENT:     Head: Normocephalic and atraumatic.  Cardiovascular:     Rate and Rhythm: Normal rate and regular rhythm.  Pulmonary:     Effort: Pulmonary effort is normal.     Breath sounds: Normal breath sounds. No wheezing, rhonchi or rales.  Neurological:     Mental Status: She is alert.  Psychiatric:        Mood and Affect: Mood normal.        Behavior: Behavior  normal.     Lab Results  Component Value Date   WBC 7.5 06/06/2021   HGB 16.1 (H) 06/06/2021   HCT 44.4 06/06/2021   PLT 244 06/06/2021   GLUCOSE 91 10/13/2021   CHOL 207 (H) 01/04/2021   TRIG 123 01/04/2021   HDL 63 01/04/2021   LDLCALC 122 (H) 01/04/2021   ALT 17 01/04/2021   AST 16 01/04/2021   NA 142 10/13/2021   K 5.2 10/13/2021   CL 104 10/13/2021   CREATININE 1.24 (H) 10/13/2021   BUN 12 10/13/2021   CO2 24 10/13/2021   TSH 3.34 09/05/2018   INR 1.06 03/04/2014   HGBA1C 5.5 06/22/2020     Assessment & Plan:   Problem List Items Addressed This Visit       Cardiovascular and Mediastinum   Hypertensive urgency - Primary    BP markedly elevated here today.  On clear the reason why.  Was repeated on several occasions and continued to be elevated.  Patient has drank a lot of coffee this morning.  I suspect that this will elevate blood pressure some but not to the extent that we see her today.  Labs for further evaluation.  Patient is asymptomatic at this time no chest pain shortness of breath vision changes.  She is feeling well.  I have increased her amlodipine.  She will follow-up later this week for a BP check.      Relevant Medications   amLODipine (NORVASC) 10 MG tablet     Genitourinary   Chronic kidney disease, stage 3a (HCC)   Relevant Orders   CBC   CMP14+EGFR   Microalbumin / creatinine urine ratio     Other   Elevated hemoglobin (HCC)   Mixed hyperlipidemia   Relevant Medications   amLODipine (NORVASC) 10 MG tablet   Other Relevant Orders   Lipid panel    Meds ordered this encounter  Medications   amLODipine (NORVASC) 10 MG tablet    Sig: Take 1 tablet (10 mg total) by mouth daily.    Dispense:  90 tablet    Refill:  3    Follow-up:  Return for BP check - Nurse visit.  [place]

## 2022-03-07 LAB — LIPID PANEL
Chol/HDL Ratio: 3 ratio (ref 0.0–4.4)
Cholesterol, Total: 216 mg/dL — ABNORMAL HIGH (ref 100–199)
HDL: 73 mg/dL (ref >39)
LDL Chol Calc (NIH): 117 mg/dL — ABNORMAL HIGH (ref 0–99)
Triglycerides: 152 mg/dL — ABNORMAL HIGH (ref 0–149)
VLDL Cholesterol Cal: 26 mg/dL (ref 5–40)

## 2022-03-07 LAB — CMP14+EGFR
ALT: 21 IU/L (ref 0–32)
AST: 23 IU/L (ref 0–40)
Albumin/Globulin Ratio: 1.7 (ref 1.2–2.2)
Albumin: 4.5 g/dL (ref 3.8–4.8)
Alkaline Phosphatase: 86 IU/L (ref 44–121)
BUN/Creatinine Ratio: 11 — ABNORMAL LOW (ref 12–28)
BUN: 12 mg/dL (ref 8–27)
Bilirubin Total: 0.5 mg/dL (ref 0.0–1.2)
CO2: 24 mmol/L (ref 20–29)
Calcium: 9.8 mg/dL (ref 8.7–10.3)
Chloride: 102 mmol/L (ref 96–106)
Creatinine, Ser: 1.11 mg/dL — ABNORMAL HIGH (ref 0.57–1.00)
Globulin, Total: 2.6 g/dL (ref 1.5–4.5)
Glucose: 97 mg/dL (ref 70–99)
Potassium: 4.3 mmol/L (ref 3.5–5.2)
Sodium: 142 mmol/L (ref 134–144)
Total Protein: 7.1 g/dL (ref 6.0–8.5)
eGFR: 50 mL/min/{1.73_m2} — ABNORMAL LOW (ref >59)

## 2022-03-07 LAB — CBC
Hematocrit: 49.5 % — ABNORMAL HIGH (ref 34.0–46.6)
Hemoglobin: 16.8 g/dL — ABNORMAL HIGH (ref 11.1–15.9)
MCH: 33.3 pg — ABNORMAL HIGH (ref 26.6–33.0)
MCHC: 33.9 g/dL (ref 31.5–35.7)
MCV: 98 fL — ABNORMAL HIGH (ref 79–97)
Platelets: 269 10*3/uL (ref 150–450)
RBC: 5.05 x10E6/uL (ref 3.77–5.28)
RDW: 12.5 % (ref 11.7–15.4)
WBC: 9.2 10*3/uL (ref 3.4–10.8)

## 2022-03-07 LAB — MICROALBUMIN / CREATININE URINE RATIO
Creatinine, Urine: 64.7 mg/dL
Microalb/Creat Ratio: 104 mg/g creat — ABNORMAL HIGH (ref 0–29)
Microalbumin, Urine: 67.2 ug/mL

## 2022-03-10 ENCOUNTER — Ambulatory Visit (INDEPENDENT_AMBULATORY_CARE_PROVIDER_SITE_OTHER): Payer: Medicare Other | Admitting: *Deleted

## 2022-03-10 VITALS — BP 138/86

## 2022-03-10 DIAGNOSIS — I10 Essential (primary) hypertension: Secondary | ICD-10-CM

## 2022-03-10 NOTE — Progress Notes (Signed)
   Subjective:    Patient ID: Joanne White, female    DOB: 02-13-1941, 82 y.o.   MRN: [id number]  HPI  Patient arrives for a blood pressure recheck BP 138/86 Consult with Dr Lacinda Axon: Blood pressure looks better continue current treatment and follow up as planned . Patient verbalized understanding.   Review of Systems     Objective:   Physical Exam        Assessment & Plan:

## 2022-03-21 ENCOUNTER — Encounter: Payer: Self-pay | Admitting: Family Medicine

## 2022-03-21 ENCOUNTER — Ambulatory Visit (INDEPENDENT_AMBULATORY_CARE_PROVIDER_SITE_OTHER): Payer: Medicare Other | Admitting: Family Medicine

## 2022-03-21 DIAGNOSIS — N1831 Chronic kidney disease, stage 3a: Secondary | ICD-10-CM

## 2022-03-21 DIAGNOSIS — I10 Essential (primary) hypertension: Secondary | ICD-10-CM | POA: Diagnosis not present

## 2022-03-21 DIAGNOSIS — D582 Other hemoglobinopathies: Secondary | ICD-10-CM | POA: Diagnosis not present

## 2022-03-21 NOTE — Progress Notes (Signed)
Subjective:  Patient ID: Joanne White, female    DOB: [birth year]  Age: 82 y.o. MRN: [id number]  CC: Chief Complaint  Patient presents with   discuss labs    HPI:  82 year old female presents for follow-up to discuss labs.  Patient has persistently elevated hemoglobin.  She smoked years ago but did not smoke very much.  I do not feel like this is the contributing factor.  She endorses that 2 of her siblings have had issues with elevated hemoglobin.  She wants to discuss recommendation for referral today.  Blood pressure elevated here.  Patient brings in her home blood pressure readings.  Blood pressure readings are well-controlled at home.  Blood pressure was below 130/80 this morning before she came here.   Patient Active Problem List   Diagnosis Date Noted   Elevated hemoglobin ([place]) 12/01/2021   Allergic contact dermatitis due to other agents 10/20/2021   Mixed hyperlipidemia 02/14/2021   Allergy to alpha-gal 02/20/2019   Essential hypertension    Chronic kidney disease, stage 3a (HCC)     Social Hx   Social History   Socioeconomic History   Marital status: Single    Spouse name: Not on file   Number of children: Not on file   Years of education: Not on file   Highest education level: Not on file  Occupational History   Occupation: retired  Tobacco Use   Smoking status: Former    Types: Cigarettes    Quit date: 03/03/2011    Years since quitting: 11.0   Smokeless tobacco: Never  Vaping Use   Vaping Use: Never used  Substance and Sexual Activity   Alcohol use: Yes    Comment: 1 beer once or twice a week and a rare mixed drink   Drug use: No   Sexual activity: Not Currently  Other Topics Concern   Not on file  Social History Narrative   Not on file   Social Determinants of Health   Financial Resource Strain: Low Risk  (04/11/2021)   Overall Financial Resource Strain (CARDIA)    Difficulty of Paying Living Expenses: Not hard at all  Food Insecurity: No  Food Insecurity (04/11/2021)   Hunger Vital Sign    Worried About Running Out of Food in the Last Year: Never true    Ran Out of Food in the Last Year: Never true  Transportation Needs: No Transportation Needs (04/11/2021)   PRAPARE - Hydrologist (Medical): No    Lack of Transportation (Non-Medical): No  Physical Activity: Insufficiently Active (04/11/2021)   Exercise Vital Sign    Days of Exercise per Week: 3 days    Minutes of Exercise per Session: 40 min  Stress: No Stress Concern Present (04/11/2021)   [place]    Feeling of Stress : Not at all  Social Connections: Moderately Isolated (04/11/2021)   Social Connection and Isolation Panel [NHANES]    Frequency of Communication with Friends and Family: More than three times a week    Frequency of Social Gatherings with Friends and Family: Three times a week    Attends Religious Services: 1 to 4 times per year    Active Member of Clubs or Organizations: No    Attends Archivist Meetings: Never    Marital Status: Never married    Review of Systems Per HPI  Objective:  BP (!) 166/80   Pulse 64   Temp (!)  97.3 F (36.3 C)   Ht 5\' 3"  (1.6 m)   Wt 162 lb (73.5 kg)   SpO2 99%   BMI 28.70 kg/m      03/21/2022    9:54 AM 03/21/2022    9:43 AM 03/10/2022   10:41 AM  BP/Weight  Systolic BP 324 401 027  Diastolic BP 80 78 86  Wt. (Lbs)  162   BMI  28.7 kg/m2     Physical Exam Vitals and nursing note reviewed.  Constitutional:      General: She is not in acute distress.    Appearance: Normal appearance.  HENT:     Head: Normocephalic and atraumatic.  Cardiovascular:     Rate and Rhythm: Normal rate and regular rhythm.  Pulmonary:     Effort: Pulmonary effort is normal.     Breath sounds: No wheezing, rhonchi or rales.  Neurological:     Mental Status: She is alert.     Lab Results  Component Value Date   WBC 9.2  03/06/2022   HGB 16.8 (H) 03/06/2022   HCT 49.5 (H) 03/06/2022   PLT 269 03/06/2022   GLUCOSE 97 03/06/2022   CHOL 216 (H) 03/06/2022   TRIG 152 (H) 03/06/2022   HDL 73 03/06/2022   LDLCALC 117 (H) 03/06/2022   ALT 21 03/06/2022   AST 23 03/06/2022   NA 142 03/06/2022   K 4.3 03/06/2022   CL 102 03/06/2022   CREATININE 1.11 (H) 03/06/2022   BUN 12 03/06/2022   CO2 24 03/06/2022   TSH 3.34 09/05/2018   INR 1.06 03/04/2014   HGBA1C 5.5 06/22/2020     Assessment & Plan:   Problem List Items Addressed This Visit       Cardiovascular and Mediastinum   Essential hypertension    Patient hypertension is well-controlled at home.  She brings in her home readings today.  BP is elevated here.  Will continue current pharmacotherapy with amlodipine. Patient has underlying CKD with proteinuria.  Will discuss changing her blood pressure medication with addition of ARB at follow-up when proteinuria is reassessed.        Genitourinary   Chronic kidney disease, stage 3a ([place])    Stable.  Will consider addition of ARB at next visit.  Will reevaluate proteinuria in 3 months.      Relevant Orders   CBC   Basic metabolic panel   Microalbumin / creatinine urine ratio     Other   Elevated hemoglobin (HCC)    Persistently elevated hemoglobin.  Patient states that 2 of her siblings have had issues with elevated hemoglobin.  Discussed referral to hematology.  Patient wants to wait until it is reevaluated again.  Follow-up in 3 months.  Recheck labs again in 3 months.      Relevant Orders   CBC    Follow-up:  Return in about 3 months (around 06/19/2022).  [place]

## 2022-03-21 NOTE — Assessment & Plan Note (Signed)
Stable.  Will consider addition of ARB at next visit.  Will reevaluate proteinuria in 3 months.

## 2022-03-21 NOTE — Patient Instructions (Signed)
Follow up in 3 months. We will check labs then.  Take care  Dr. Lacinda Axon

## 2022-03-21 NOTE — Assessment & Plan Note (Signed)
Persistently elevated hemoglobin.  Patient states that 2 of her siblings have had issues with elevated hemoglobin.  Discussed referral to hematology.  Patient wants to wait until it is reevaluated again.  Follow-up in 3 months.  Recheck labs again in 3 months.

## 2022-03-21 NOTE — Assessment & Plan Note (Addendum)
Patient hypertension is well-controlled at home.  She brings in her home readings today.  BP is elevated here.  Will continue current pharmacotherapy with amlodipine. Patient has underlying CKD with proteinuria.  Will discuss changing her blood pressure medication with addition of ARB at follow-up when proteinuria is reassessed.

## 2022-04-12 ENCOUNTER — Telehealth: Payer: Self-pay | Admitting: Family Medicine

## 2022-04-12 NOTE — Telephone Encounter (Signed)
Contacted [name] to schedule their annual wellness visit. Appointment made for 05/05/2022.  Thank you,  Colletta Maryland,  [facility] Direct Dial ??[phone]

## 2022-04-12 NOTE — Telephone Encounter (Signed)
Contacted [name] to schedule their annual wellness visit. Appointment made for 04/28/2022.   Thank you,  Colletta Maryland,  [place] [facility] Direct Dial ??[phone]

## 2022-04-12 NOTE — Telephone Encounter (Signed)
Called patient to schedule Medicare Annual Wellness Visit (AWV). Left message for patient to call back and schedule Medicare Annual Wellness Visit (AWV).  Last date of AWV: 04/11/2021  Please schedule an appointment at any time with [facility].  If any questions, please contact me at [phone].  Thank you,  Colletta Maryland,  [place] [facility] Direct Dial ??[phone]

## 2022-04-28 ENCOUNTER — Telehealth: Payer: Self-pay | Admitting: Family Medicine

## 2022-04-28 ENCOUNTER — Other Ambulatory Visit: Payer: Self-pay | Admitting: Family Medicine

## 2022-04-28 MED ORDER — LOSARTAN POTASSIUM 50 MG PO TABS: 50.0000 mg | ORAL_TABLET | Freq: Every day | ORAL | 3 refills | Status: DC

## 2022-04-28 NOTE — Telephone Encounter (Signed)
Patient called states that she has noticed her left ankle swelling more the past week. She would like to know if she should decrease her blood pressure medicine that was one of the side effects.  CB# [phone]

## 2022-04-28 NOTE — Telephone Encounter (Signed)
Patient notified and verbalized understanding. 

## 2022-04-28 NOTE — Telephone Encounter (Signed)
Thersa Salt G, DO     Lets stop the Amlodipine. I am sending in something else for her BP. She needs to see me in the next 1-2 weeks.

## 2022-05-05 ENCOUNTER — Ambulatory Visit (INDEPENDENT_AMBULATORY_CARE_PROVIDER_SITE_OTHER): Payer: Medicare Other

## 2022-05-05 VITALS — BP 134/75 | Ht 63.0 in | Wt 162.0 lb

## 2022-05-05 DIAGNOSIS — Z0000 Encounter for general adult medical examination without abnormal findings: Secondary | ICD-10-CM

## 2022-05-05 NOTE — Patient Instructions (Signed)
Joanne White , Thank you for taking time to come for your Medicare Wellness Visit. I appreciate your ongoing commitment to your health goals. Please review the following plan we discussed and let me know if I can assist you in the future.   These are the goals we discussed:  Goals   None     This is a list of the screening recommended for you and due dates:  Health Maintenance  Topic Date Due   DTaP/Tdap/Td vaccine (1 - Tdap) Never done   Zoster (Shingles) Vaccine (1 of 2) Never done   Pneumonia Vaccine (2 of 2 - PCV) 10/29/2014   COVID-19 Vaccine (4 - 2023-24 season) 10/14/2021   DEXA scan (bone density measurement)  12/02/2022*   Medicare Annual Wellness Visit  05/05/2023   Flu Shot  Completed   HPV Vaccine  Aged Out  *Topic was postponed. The date shown is not the original due date.    Advanced directives: Please bring a copy of your health care power of attorney and living will to the office to be added to your chart at your convenience.      Conditions/risks identified: Aim for 30 minutes of exercise or brisk walking, 6-8 glasses of water, and 5 servings of fruits and vegetables each day.   Next appointment: Follow up in one year for your annual wellness visit    Preventive Care 65 Years and Older, Female Preventive care refers to lifestyle choices and visits with your health care provider that can promote health and wellness. What does preventive care include? A yearly physical exam. This is also called an annual well check. Dental exams once or twice a year. Routine eye exams. Ask your health care provider how often you should have your eyes checked. Personal lifestyle choices, including: Daily care of your teeth and gums. Regular physical activity. Eating a healthy diet. Avoiding tobacco and drug use. Limiting alcohol use. Practicing safe sex. Taking low-dose aspirin every day. Taking vitamin and mineral supplements as recommended by your health care provider. What  happens during an annual well check? The services and screenings done by your health care provider during your annual well check will depend on your age, overall health, lifestyle risk factors, and family history of disease. Counseling  Your health care provider may ask you questions about your: Alcohol use. Tobacco use. Drug use. Emotional well-being. Home and relationship well-being. Sexual activity. Eating habits. History of falls. Memory and ability to understand (cognition). Work and work Statistician. Reproductive health. Screening  You may have the following tests or measurements: Height, weight, and BMI. Blood pressure. Lipid and cholesterol levels. These may be checked every 5 years, or more frequently if you are over 54 years old. Skin check. Lung cancer screening. You may have this screening every year starting at age 9 if you have a 30-pack-year history of smoking and currently smoke or have quit within the past 15 years. Fecal occult blood test (FOBT) of the stool. You may have this test every year starting at age 52. Flexible sigmoidoscopy or colonoscopy. You may have a sigmoidoscopy every 5 years or a colonoscopy every 10 years starting at age 40. Hepatitis C blood test. Hepatitis B blood test. Sexually transmitted disease (STD) testing. Diabetes screening. This is done by checking your blood sugar (glucose) after you have not eaten for a while (fasting). You may have this done every 1-3 years. Bone density scan. This is done to screen for osteoporosis. You may have this done  starting at age 62. Mammogram. This may be done every 1-2 years. Talk to your health care provider about how often you should have regular mammograms. Talk with your health care provider about your test results, treatment options, and if necessary, the need for more tests. Vaccines  Your health care provider may recommend certain vaccines, such as: Influenza vaccine. This is recommended every  year. Tetanus, diphtheria, and acellular pertussis (Tdap, Td) vaccine. You may need a Td booster every 10 years. Zoster vaccine. You may need this after age 54. Pneumococcal 13-valent conjugate (PCV13) vaccine. One dose is recommended after age 102. Pneumococcal polysaccharide (PPSV23) vaccine. One dose is recommended after age 2. Talk to your health care provider about which screenings and vaccines you need and how often you need them. This information is not intended to replace advice given to you by your health care provider. Make sure you discuss any questions you have with your health care provider. Document Released: 02/26/2015 Document Revised: 10/20/2015 Document Reviewed: 12/01/2014 Elsevier Interactive Patient Education  2017 [place] Prevention in the Home Falls can cause injuries. They can happen to people of all ages. There are many things you can do to make your home safe and to help prevent falls. What can I do on the outside of my home? Regularly fix the edges of walkways and driveways and fix any cracks. Remove anything that might make you trip as you walk through a door, such as a raised step or threshold. Trim any bushes or trees on the path to your home. Use bright outdoor lighting. Clear any walking paths of anything that might make someone trip, such as rocks or tools. Regularly check to see if handrails are loose or broken. Make sure that both sides of any steps have handrails. Any raised decks and porches should have guardrails on the edges. Have any leaves, snow, or ice cleared regularly. Use sand or salt on walking paths during winter. Clean up any spills in your garage right away. This includes oil or grease spills. What can I do in the bathroom? Use night lights. Install grab bars by the toilet and in the tub and shower. Do not use towel bars as grab bars. Use non-skid mats or decals in the tub or shower. If you need to sit down in the shower, use a  plastic, non-slip stool. Keep the floor dry. Clean up any water that spills on the floor as soon as it happens. Remove soap buildup in the tub or shower regularly. Attach bath mats securely with double-sided non-slip rug tape. Do not have throw rugs and other things on the floor that can make you trip. What can I do in the bedroom? Use night lights. Make sure that you have a light by your bed that is easy to reach. Do not use any sheets or blankets that are too big for your bed. They should not hang down onto the floor. Have a firm chair that has side arms. You can use this for support while you get dressed. Do not have throw rugs and other things on the floor that can make you trip. What can I do in the kitchen? Clean up any spills right away. Avoid walking on wet floors. Keep items that you use a lot in easy-to-reach places. If you need to reach something above you, use a strong step stool that has a grab bar. Keep electrical cords out of the way. Do not use floor polish or wax that  makes floors slippery. If you must use wax, use non-skid floor wax. Do not have throw rugs and other things on the floor that can make you trip. What can I do with my stairs? Do not leave any items on the stairs. Make sure that there are handrails on both sides of the stairs and use them. Fix handrails that are broken or loose. Make sure that handrails are as long as the stairways. Check any carpeting to make sure that it is firmly attached to the stairs. Fix any carpet that is loose or worn. Avoid having throw rugs at the top or bottom of the stairs. If you do have throw rugs, attach them to the floor with carpet tape. Make sure that you have a light switch at the top of the stairs and the bottom of the stairs. If you do not have them, ask someone to add them for you. What else can I do to help prevent falls? Wear shoes that: Do not have high heels. Have rubber bottoms. Are comfortable and fit you  well. Are closed at the toe. Do not wear sandals. If you use a stepladder: Make sure that it is fully opened. Do not climb a closed stepladder. Make sure that both sides of the stepladder are locked into place. Ask someone to hold it for you, if possible. Clearly mark and make sure that you can see: Any grab bars or handrails. First and last steps. Where the edge of each step is. Use tools that help you move around (mobility aids) if they are needed. These include: Canes. Walkers. Scooters. Crutches. Turn on the lights when you go into a dark area. Replace any light bulbs as soon as they burn out. Set up your furniture so you have a clear path. Avoid moving your furniture around. If any of your floors are uneven, fix them. If there are any pets around you, be aware of where they are. Review your medicines with your doctor. Some medicines can make you feel dizzy. This can increase your chance of falling. Ask your doctor what other things that you can do to help prevent falls. This information is not intended to replace advice given to you by your health care provider. Make sure you discuss any questions you have with your health care provider. Document Released: 11/26/2008 Document Revised: 07/08/2015 Document Reviewed: 03/06/2014 Elsevier Interactive Patient Education  2017 [employer].

## 2022-05-05 NOTE — Progress Notes (Signed)
Subjective:   Joanne White is a 82 y.o. female who presents for Medicare Annual (Subsequent) preventive examination.  I connected with  Joanne White on 05/05/22 by a audio enabled telemedicine application and verified that I am speaking with the correct person using two identifiers.  Patient Location: Home  Provider Location: Office/Clinic  I discussed the limitations of evaluation and management by telemedicine. The patient expressed understanding and agreed to proceed.  Review of Systems     Cardiac Risk Factors include: advanced age (>40men, >43 women);hypertension;sedentary lifestyle     Objective:    Today's Vitals   05/05/22 1148  BP: 134/75  Weight: 162 lb (73.5 kg)  Height: 5\' 3"  (1.6 m)   Body mass index is 28.7 kg/m.     05/05/2022   12:06 PM 04/11/2021    9:52 AM 03/11/2020    9:43 AM 08/05/2014    7:10 PM 03/03/2014    6:14 PM 03/02/2014    2:19 PM 03/02/2014   10:22 AM  Advanced Directives  Does Patient Have a Medical Advance Directive? No No Yes No No No No  Type of Advance Directive   Living will      Would patient like information on creating a medical advance directive? No - Patient declined No - Patient declined No - Patient declined  No - patient declined information No - patient declined information     Current Medications (verified) Outpatient Encounter Medications as of 05/05/2022  Medication Sig   cholecalciferol (VITAMIN D3) 25 MCG (1000 UNIT) tablet Take 1,000 Units by mouth daily.   losartan (COZAAR) 50 MG tablet Take 1 tablet (50 mg total) by mouth daily.   meclizine (ANTIVERT) 25 MG tablet TAKE (1/2) TABLET BY MOUTH THREE TIMES DAILY AS NEEDED.   No facility-administered encounter medications on file as of 05/05/2022.    Allergies (verified) Banana and Codeine   History: Past Medical History:  Diagnosis Date   Abnormal weight loss    Acute sinusitis, unspecified    Allergy    bananas, codeine; seasonal allergies-tree pollen    Anemia, unspecified    Cholelithiasis 03/03/2014   -status post cholecystectomy   Chronic kidney disease, stage 2 (mild)    Disappearance and death of family member    Dizziness and giddiness    Essential (primary) hypertension    HTN (hypertension), benign 03/03/2014   Hyperkalemia    Hypertension    Hypertensive chronic kidney disease with stage 1 through stage 4 chronic kidney disease, or unspecified chronic kidney disease    Pneumonia, unspecified organism    Tinnitus, right ear    Vertigo    Past Surgical History:  Procedure Laterality Date   CHOLECYSTECTOMY  03/04/2014   Procedure: LAPAROSCOPIC CHOLECYSTECTOMY;  Surgeon: Leighton Ruff, MD;  Location: [facility];  Service: General;;   NO PAST SURGERIES     Family History  Problem Relation Age of Onset   Hypertension Father    Cancer Father        lung   Heart attack Brother    Hypertension Brother    Social History   Socioeconomic History   Marital status: Single    Spouse name: Not on file   Number of children: Not on file   Years of education: Not on file   Highest education level: Not on file  Occupational History   Occupation: retired  Tobacco Use   Smoking status: Former    Types: Cigarettes    Quit date: 03/03/2011  Years since quitting: 11.1   Smokeless tobacco: Never  Vaping Use   Vaping Use: Never used  Substance and Sexual Activity   Alcohol use: Yes    Comment: 1 beer once or twice a week and a rare mixed drink   Drug use: No   Sexual activity: Not Currently  Other Topics Concern   Not on file  Social History Narrative   Not on file   Social Determinants of Health   Financial Resource Strain: Low Risk  (05/05/2022)   Overall Financial Resource Strain (CARDIA)    Difficulty of Paying Living Expenses: Not hard at all  Food Insecurity: No Food Insecurity (05/05/2022)   Hunger Vital Sign    Worried About Running Out of Food in the Last Year: Never true    Ran Out of Food in the Last Year: Never  true  Transportation Needs: No Transportation Needs (05/05/2022)   PRAPARE - Hydrologist (Medical): No    Lack of Transportation (Non-Medical): No  Physical Activity: Insufficiently Active (05/05/2022)   Exercise Vital Sign    Days of Exercise per Week: 3 days    Minutes of Exercise per Session: 30 min  Stress: No Stress Concern Present (05/05/2022)   [place]    Feeling of Stress : Not at all  Social Connections: Moderately Isolated (05/05/2022)   Social Connection and Isolation Panel [NHANES]    Frequency of Communication with Friends and Family: More than three times a week    Frequency of Social Gatherings with Friends and Family: Three times a week    Attends Religious Services: 1 to 4 times per year    Active Member of Clubs or Organizations: No    Attends Archivist Meetings: Never    Marital Status: Never married    Tobacco Counseling Counseling given: Not Answered   Clinical Intake:  Pre-visit preparation completed: Yes  Pain : No/denies pain  Diabetes: No  How often do you need to have someone help you when you read instructions, pamphlets, or other written materials from your doctor or pharmacy?: 1 - Never  Diabetic?No   Interpreter Needed?: No  Information entered by :: Denman George LPN   Activities of Daily Living    05/05/2022   12:06 PM  In your present state of health, do you have any difficulty performing the following activities:  Hearing? 0  Vision? 0  Difficulty concentrating or making decisions? 0  Walking or climbing stairs? 0  Dressing or bathing? 0  Doing errands, shopping? 0  Preparing Food and eating ? N  Using the Toilet? N  In the past six months, have you accidently leaked urine? N  Do you have problems with loss of bowel control? N  Managing your Medications? N  Managing your Finances? N  Housekeeping or managing your  Housekeeping? N    Patient Care Team: Coral Spikes, DO as PCP - General (Family Medicine)  Indicate any recent Medical Services you may have received from other than [facility] providers in the past year (date may be approximate).     Assessment:   This is a routine wellness examination for [place].  Hearing/Vision screen Hearing Screening - Comments:: Denies hearing problems  Vision Screening - Comments:: Wears rx glasses - up to date with routine eye exams with Dr. Jorja Loa    Dietary issues and exercise activities discussed: Current Exercise Habits: Home exercise routine, Type of exercise:  walking, Time (Minutes): 30, Frequency (Times/Week): 3, Weekly Exercise (Minutes/Week): 90, Intensity: Mild   Goals Addressed             This Visit's Progress    Remain active and indpendent        Depression Screen    05/05/2022   12:05 PM 03/21/2022    9:56 AM 03/06/2022   10:34 AM 10/20/2021    9:55 AM 06/14/2021   10:01 AM 04/11/2021    9:54 AM 04/11/2021    9:43 AM  PHQ 2/9 Scores  PHQ - 2 Score 0 0 0 0 0 0 0  PHQ- 9 Score  0         Fall Risk    05/05/2022   12:00 PM 03/06/2022   10:34 AM 12/01/2021    1:11 PM 10/20/2021    9:55 AM 06/14/2021   10:01 AM  Fall Risk   Falls in the past year? 0 0 0 0 0  Number falls in past yr: 0 0  0 0  Injury with Fall? 0 0  0 0  Risk for fall due to : No Fall Risks No Fall Risks No Fall Risks No Fall Risks No Fall Risks  Follow up Falls prevention discussed;Education provided;Falls evaluation completed Falls evaluation completed Falls evaluation completed Falls evaluation completed Falls evaluation completed    FALL RISK PREVENTION PERTAINING TO THE HOME:  Any stairs in or around the home? No  If so, are there any without handrails? No  Home free of loose throw rugs in walkways, pet beds, electrical cords, etc? Yes  Adequate lighting in your home to reduce risk of falls? Yes   ASSISTIVE DEVICES UTILIZED TO PREVENT FALLS:  Life alert? No   Use of a cane, [place] or w/c? No  Grab bars in the bathroom? Yes  Shower chair or bench in shower? No  Elevated toilet seat or a handicapped toilet? Yes   TIMED UP AND GO:  Was the test performed? No . Telephonic visit   Cognitive Function:        05/05/2022   12:07 PM 04/11/2021    9:57 AM 03/11/2020    9:44 AM  6CIT Screen  What Year? 0 points 0 points 0 points  What month? 0 points 0 points 0 points  What time? 0 points 0 points 0 points  Count back from 20 0 points 0 points 0 points  Months in reverse 0 points 0 points 0 points  Repeat phrase 0 points 0 points   Total Score 0 points 0 points     Immunizations Immunization History  Administered Date(s) Administered   Fluad Quad(high Dose 65+) 10/25/2020, 10/20/2021   Influenza, High Dose Seasonal PF 11/08/2015, 11/06/2016, 11/01/2017   Influenza,inj,Quad PF,6+ Mos 10/16/2018   Influenza-Unspecified 10/28/2014, 12/15/2019   Moderna Sars-Covid-2 Vaccination 03/07/2019, 04/09/2019, 12/06/2019   Pneumococcal Polysaccharide-23 10/28/2013    TDAP status: Due, Education has been provided regarding the importance of this vaccine. Advised may receive this vaccine at local pharmacy or Health Dept. Aware to provide a copy of the vaccination record if obtained from local pharmacy or Health Dept. Verbalized acceptance and understanding.  Flu Vaccine status: Up to date  Pneumococcal vaccine status: Due, Education has been provided regarding the importance of this vaccine. Advised may receive this vaccine at local pharmacy or Health Dept. Aware to provide a copy of the vaccination record if obtained from local pharmacy or Health Dept. Verbalized acceptance and understanding.  Covid-19 vaccine status: Information  provided on how to obtain vaccines.   Qualifies for Shingles Vaccine? Yes   Zostavax completed No   Shingrix Completed?: No.    Education has been provided regarding the importance of this vaccine. Patient has been advised  to call insurance company to determine out of pocket expense if they have not yet received this vaccine. Advised may also receive vaccine at local pharmacy or Health Dept. Verbalized acceptance and understanding.  Screening Tests Health Maintenance  Topic Date Due   DTaP/Tdap/Td (1 - Tdap) Never done   Zoster Vaccines- Shingrix (1 of 2) Never done   Pneumonia Vaccine 72+ Years old (2 of 2 - PCV) 10/29/2014   COVID-19 Vaccine (4 - 2023-24 season) 10/14/2021   DEXA SCAN  12/02/2022 (Originally 07/25/2006)   Medicare Annual Wellness (AWV)  05/05/2023   INFLUENZA VACCINE  Completed   HPV VACCINES  Aged Out    Health Maintenance  Health Maintenance Due  Topic Date Due   DTaP/Tdap/Td (1 - Tdap) Never done   Zoster Vaccines- Shingrix (1 of 2) Never done   Pneumonia Vaccine 3+ Years old (2 of 2 - PCV) 10/29/2014   COVID-19 Vaccine (4 - 2023-24 season) 10/14/2021    Colorectal cancer screening: No longer required.   Mammogram status: No longer required due to age.  Bone Density status: Patient declines at this time   Lung Cancer Screening: (Low Dose CT Chest recommended if Age 6-80 years, 30 pack-year currently smoking OR have quit w/in 15years.) does not qualify.   Lung Cancer Screening Referral: n/a  Additional Screening:  Hepatitis C Screening: does not qualify  Vision Screening: Recommended annual ophthalmology exams for early detection of glaucoma and other disorders of the eye. Is the patient up to date with their annual eye exam?  Yes  Who is the provider or what is the name of the office in which the patient attends annual eye exams? Dr. Jorja Loa If pt is not established with a provider, would they like to be referred to a provider to establish care? No .   Dental Screening: Recommended annual dental exams for proper oral hygiene  Community Resource Referral / Chronic Care Management: CRR required this visit?  No   CCM required this visit?  No      Plan:     I  have personally reviewed and noted the following in the patient's chart:   Medical and social history Use of alcohol, tobacco or illicit drugs  Current medications and supplements including opioid prescriptions. Patient is not currently taking opioid prescriptions. Functional ability and status Nutritional status Physical activity Advanced directives List of other physicians Hospitalizations, surgeries, and ER visits in previous 12 months Vitals Screenings to include cognitive, depression, and falls Referrals and appointments  In addition, I have reviewed and discussed with patient certain preventive protocols, quality metrics, and best practice recommendations. A written personalized care plan for preventive services as well as general preventive health recommendations were provided to patient.     Vanetta Mulders, [place]   [id number]   Due to this being a virtual visit, the after visit summary with patients personalized plan was offered to patient via mail or my-chart.  per request, patient was mailed a copy of AVS.  Nurse Notes: No concerns

## 2022-05-12 ENCOUNTER — Ambulatory Visit (INDEPENDENT_AMBULATORY_CARE_PROVIDER_SITE_OTHER): Payer: Medicare Other | Admitting: Family Medicine

## 2022-05-12 DIAGNOSIS — N1831 Chronic kidney disease, stage 3a: Secondary | ICD-10-CM

## 2022-05-12 DIAGNOSIS — I10 Essential (primary) hypertension: Secondary | ICD-10-CM | POA: Diagnosis not present

## 2022-05-12 MED ORDER — MECLIZINE HCL 25 MG PO TABS: 12.5000 mg | ORAL_TABLET | Freq: Three times a day (TID) | ORAL | 0 refills | Status: DC | PRN

## 2022-05-12 NOTE — Patient Instructions (Signed)
Continue your medication.  Follow up in 3 months.  

## 2022-05-12 NOTE — Progress Notes (Signed)
Subjective:  Patient ID: Joanne White, female    DOB: 28-Mar-1940  Age: 82 y.o. MRN: [id number]  CC: Chief Complaint  Patient presents with   Hypertension    Follow up for blood pressure medications.     HPI:  82 year old female with hypertension, CKD, hyperlipidemia, elevated hemoglobin presents for follow-up.  Patient tolerating losartan well.  Her blood pressure is markedly elevated here.  Patient clinically has whitecoat hypertension.  She brings in her home readings in her home blood pressure cuff showing that her blood pressures are well-controlled.  She is feeling well.  She has no complaints or concerns at this time.  Patient Active Problem List   Diagnosis Date Noted   Elevated hemoglobin ([place]) 12/01/2021   Allergic contact dermatitis due to other agents 10/20/2021   Mixed hyperlipidemia 02/14/2021   Allergy to alpha-gal 02/20/2019   White coat syndrome with diagnosis of hypertension    Chronic kidney disease, stage 3a ([facility])     Social Hx   Social History   Socioeconomic History   Marital status: Single    Spouse name: Not on file   Number of children: Not on file   Years of education: Not on file   Highest education level: Not on file  Occupational History   Occupation: retired  Tobacco Use   Smoking status: Former    Types: Cigarettes    Quit date: 03/03/2011    Years since quitting: 11.2   Smokeless tobacco: Never  Vaping Use   Vaping Use: Never used  Substance and Sexual Activity   Alcohol use: Yes    Comment: 1 beer once or twice a week and a rare mixed drink   Drug use: No   Sexual activity: Not Currently  Other Topics Concern   Not on file  Social History Narrative   Not on file   Social Determinants of Health   Financial Resource Strain: Low Risk  (05/05/2022)   Overall Financial Resource Strain (CARDIA)    Difficulty of Paying Living Expenses: Not hard at all  Food Insecurity: No Food Insecurity (05/05/2022)   Hunger Vital Sign     Worried About Running Out of Food in the Last Year: Never true    Ran Out of Food in the Last Year: Never true  Transportation Needs: No Transportation Needs (05/05/2022)   PRAPARE - Hydrologist (Medical): No    Lack of Transportation (Non-Medical): No  Physical Activity: Insufficiently Active (05/05/2022)   Exercise Vital Sign    Days of Exercise per Week: 3 days    Minutes of Exercise per Session: 30 min  Stress: No Stress Concern Present (05/05/2022)   [place]    Feeling of Stress : Not at all  Social Connections: Moderately Isolated (05/05/2022)   Social Connection and Isolation Panel [NHANES]    Frequency of Communication with Friends and Family: More than three times a week    Frequency of Social Gatherings with Friends and Family: Three times a week    Attends Religious Services: 1 to 4 times per year    Active Member of Clubs or Organizations: No    Attends Archivist Meetings: Never    Marital Status: Never married    Review of Systems  Constitutional: Negative.   Respiratory: Negative.    Cardiovascular: Negative.     Objective:  BP (!) 180/100   Pulse 99   Ht 5\' 3"  (  1.6 m)   Wt 160 lb 3.2 oz (72.7 kg)   SpO2 96%   BMI 28.38 kg/m      05/12/2022   10:13 AM 05/05/2022   11:48 AM 03/21/2022    9:54 AM  BP/Weight  Systolic BP 99991111 [id number]  Diastolic BP 123XX123 75 80  Wt. (Lbs) 160.2 162   BMI 28.38 kg/m2 28.7 kg/m2     Physical Exam Vitals and nursing note reviewed.  Constitutional:      General: She is not in acute distress.    Appearance: Normal appearance.  HENT:     Head: Normocephalic and atraumatic.  Eyes:     General:        Right eye: No discharge.        Left eye: No discharge.     Conjunctiva/sclera: Conjunctivae normal.  Cardiovascular:     Rate and Rhythm: Normal rate and regular rhythm.  Pulmonary:     Effort: Pulmonary effort is normal.      Breath sounds: Normal breath sounds. No wheezing, rhonchi or rales.  Neurological:     Mental Status: She is alert.  Psychiatric:        Mood and Affect: Mood normal.        Behavior: Behavior normal.     Lab Results  Component Value Date   WBC 9.2 03/06/2022   HGB 16.8 (H) 03/06/2022   HCT 49.5 (H) 03/06/2022   PLT 269 03/06/2022   GLUCOSE 97 03/06/2022   CHOL 216 (H) 03/06/2022   TRIG 152 (H) 03/06/2022   HDL 73 03/06/2022   LDLCALC 117 (H) 03/06/2022   ALT 21 03/06/2022   AST 23 03/06/2022   NA 142 03/06/2022   K 4.3 03/06/2022   CL 102 03/06/2022   CREATININE 1.11 (H) 03/06/2022   BUN 12 03/06/2022   CO2 24 03/06/2022   TSH 3.34 09/05/2018   INR 1.06 03/04/2014   HGBA1C 5.5 06/22/2020     Assessment & Plan:   Problem List Items Addressed This Visit       Cardiovascular and Mediastinum   White coat syndrome with diagnosis of hypertension    BP readings well-controlled outside of the office.  Continuing losartan.  Metabolic panel and urine ACR ordered.         Genitourinary   Chronic kidney disease, stage 3a ([place])   Relevant Orders   Basic metabolic panel   Microalbumin / creatinine urine ratio    Meds ordered this encounter  Medications   meclizine (ANTIVERT) 25 MG tablet    Sig: Take 0.5-1 tablets (12.5-25 mg total) by mouth 3 (three) times daily as needed for dizziness.    Dispense:  30 tablet    Refill:  0    Follow-up:  3 months  [place] DO [place]

## 2022-05-12 NOTE — Assessment & Plan Note (Signed)
BP readings well-controlled outside of the office.  Continuing losartan.  Metabolic panel and urine ACR ordered.

## 2022-05-15 ENCOUNTER — Telehealth: Payer: Self-pay

## 2022-05-15 NOTE — Telephone Encounter (Signed)
Called and left message for patient to call office. (Dr Lacinda Axon would like her to get her labs drawn this week.)

## 2022-06-17 ENCOUNTER — Ambulatory Visit
Admission: RE | Admit: 2022-06-17 | Discharge: 2022-06-17 | Disposition: A | Discharge status: Home / Self Care | Payer: Medicare Other | Source: Ambulatory Visit | Attending: Family Medicine | Admitting: Family Medicine

## 2022-06-17 VITALS — BP 194/79 | HR 69 | Temp 98.4°F | Resp 18

## 2022-06-17 DIAGNOSIS — W57XXXA Bitten or stung by nonvenomous insect and other nonvenomous arthropods, initial encounter: Secondary | ICD-10-CM | POA: Diagnosis not present

## 2022-06-17 DIAGNOSIS — R238 Other skin changes: Secondary | ICD-10-CM | POA: Diagnosis not present

## 2022-06-17 DIAGNOSIS — S80861A Insect bite (nonvenomous), right lower leg, initial encounter: Secondary | ICD-10-CM

## 2022-06-17 NOTE — ED Triage Notes (Signed)
Pt presents with a tick bite to her right upper calf. She noticed the tick on 06/10/22 and since then it has gotten red and now has redness with a darker red circle around the bite. Pt states no flu like symptoms or body aches at this time.

## 2022-06-17 NOTE — Discharge Instructions (Addendum)
Your blood pressure was noted to be elevated during your visit today. You may return here within the next few days to recheck if unable to see your primary care provider or if you do not have a one.  BP (!) 194/79 (BP Location: Right Arm)   Pulse 69   Temp 98.4 F (36.9 C) (Oral)   Resp 18   SpO2 97%   BP Readings from Last 3 Encounters:  06/17/22 (!) 194/79  05/12/22 (!) 180/100  05/05/22 134/75   '

## 2022-06-19 ENCOUNTER — Ambulatory Visit: Payer: Medicare Other | Admitting: Family Medicine

## 2022-06-19 NOTE — ED Provider Notes (Signed)
[facility]   [id number] 06/17/22 Arrival Time: 0934  ASSESSMENT & PLAN:  1. Skin irritation   2. Tick bite of right lower leg, initial encounter    Tick bite > 1 week ago. No signs of skin infection. He is comfortable with observation. OTC hydrocortisone cream should help with any itching. Will follow up with PCP or here if worsening or failing to improve as anticipated. Reviewed expectations re: course of current medical issues. Questions answered. Outlined signs and symptoms indicating need for more acute intervention. Patient verbalized understanding. After Visit Summary given.   SUBJECTIVE:  Joanne White is a 82 y.o. female who presents with a skin complaint. Pt presents with a tick bite to her right upper calf. She noticed the non-engorged tick on 06/10/22 and since then it has gotten red and now has redness with a darker red circle around the bite. Pt states no flu like symptoms or body aches at this time.  \   OBJECTIVE: Vitals:   06/17/22 1005  BP: (!) 194/79  Pulse: 69  Resp: 18  Temp: 98.4 F (36.9 C)  TempSrc: Oral  SpO2: 97%    General appearance: alert; no distress HEENT: [place]; AT Neck: supple with FROM Extremities: no edema; moves all extremities normally Skin: warm and dry; R upper calf with approx 1 cm irregular area of erythema; cool to touch; no retained tick parts; no signs of infection; non-tender Psychological: alert and cooperative; normal mood and affect  Allergies  Allergen Reactions   Banana     Stomach cramping and pain   Codeine Nausea And Vomiting    Past Medical History:  Diagnosis Date   Abnormal weight loss    Acute sinusitis, unspecified    Allergy    bananas, codeine; seasonal allergies-tree pollen   Anemia, unspecified    Cholelithiasis 03/03/2014   -status post cholecystectomy   Chronic kidney disease, stage 2 (mild)    Disappearance and death of family member    Dizziness and giddiness    Essential (primary)  hypertension    HTN (hypertension), benign 03/03/2014   Hyperkalemia    Hypertension    Hypertensive chronic kidney disease with stage 1 through stage 4 chronic kidney disease, or unspecified chronic kidney disease    Pneumonia, unspecified organism    Tinnitus, right ear    Vertigo    Social History   Socioeconomic History   Marital status: Single    Spouse name: Not on file   Number of children: Not on file   Years of education: Not on file   Highest education level: Not on file  Occupational History   Occupation: retired  Tobacco Use   Smoking status: Former    Types: Cigarettes    Quit date: 03/03/2011    Years since quitting: 11.3   Smokeless tobacco: Never  Vaping Use   Vaping Use: Never used  Substance and Sexual Activity   Alcohol use: Yes    Comment: 1 beer once or twice a week and a rare mixed drink   Drug use: No   Sexual activity: Not Currently  Other Topics Concern   Not on file  Social History Narrative   Not on file   Social Determinants of Health   Financial Resource Strain: Low Risk  (05/05/2022)   Overall Financial Resource Strain (CARDIA)    Difficulty of Paying Living Expenses: Not hard at all  Food Insecurity: No Food Insecurity (05/05/2022)   Hunger Vital Sign  Worried About Programme researcher, broadcasting/film/video in the Last Year: Never true    Ran Out of Food in the Last Year: Never true  Transportation Needs: No Transportation Needs (05/05/2022)   PRAPARE - Administrator, Civil Service (Medical): No    Lack of Transportation (Non-Medical): No  Physical Activity: Insufficiently Active (05/05/2022)   Exercise Vital Sign    Days of Exercise per Week: 3 days    Minutes of Exercise per Session: 30 min  Stress: No Stress Concern Present (05/05/2022)   [employer] of Occupational Health - Occupational Stress Questionnaire    Feeling of Stress : Not at all  Social Connections: Moderately Isolated (05/05/2022)   Social Connection and Isolation Panel  [NHANES]    Frequency of Communication with Friends and Family: More than three times a week    Frequency of Social Gatherings with Friends and Family: Three times a week    Attends Religious Services: 1 to 4 times per year    Active Member of Clubs or Organizations: No    Attends Banker Meetings: Never    Marital Status: Never married  Intimate Partner Violence: Not At Risk (05/05/2022)   Humiliation, Afraid, Rape, and Kick questionnaire    Fear of Current or Ex-Partner: No    Emotionally Abused: No    Physically Abused: No    Sexually Abused: No   Family History  Problem Relation Age of Onset   Hypertension Father    Cancer Father        lung   Heart attack Brother    Hypertension Brother    Past Surgical History:  Procedure Laterality Date   CHOLECYSTECTOMY  03/04/2014   Procedure: LAPAROSCOPIC CHOLECYSTECTOMY;  Surgeon: Romie Levee, MD;  Location: [facility];  Service: General;;   NO PAST SURGERIES        Mardella Layman, MD 06/19/22 1005

## 2022-08-07 ENCOUNTER — Ambulatory Visit (INDEPENDENT_AMBULATORY_CARE_PROVIDER_SITE_OTHER): Payer: Medicare Other | Admitting: Nurse Practitioner

## 2022-08-07 ENCOUNTER — Encounter: Payer: Self-pay | Admitting: Nurse Practitioner

## 2022-08-07 VITALS — BP 207/85 | HR 63 | Wt 162.6 lb

## 2022-08-07 DIAGNOSIS — S81832A Puncture wound without foreign body, left lower leg, initial encounter: Secondary | ICD-10-CM

## 2022-08-07 DIAGNOSIS — Z23 Encounter for immunization: Secondary | ICD-10-CM

## 2022-08-07 DIAGNOSIS — N1831 Chronic kidney disease, stage 3a: Secondary | ICD-10-CM | POA: Diagnosis not present

## 2022-08-07 MED ORDER — CEPHALEXIN 500 MG PO CAPS: 500.0000 mg | ORAL_CAPSULE | Freq: Three times a day (TID) | ORAL | 0 refills | Status: DC

## 2022-08-07 NOTE — Patient Instructions (Signed)
Apply warm compresses to area If scab comes off, apply Neosporin Take antibiotic as directed Call back if increased redness or fever .

## 2022-08-07 NOTE — Progress Notes (Signed)
   Subjective:    Patient ID: Joanne White, female    DOB: June 03, 1940, 82 y.o.   MRN: [id number]  HPI Patient arrives with possible tick bite on left shin. Patient states it is sore to the touch. No specific history of injury. Noticed 4-5 days ago. No fever. No other rash. Has applied Neosporin. Note: her systolic BP last night at home was 127.         Objective:   Physical Exam NAD. Alert, oriented. 2-3 cm moderately erythematous well defined oblong area noted on left pretibial area with a small scab in the center. Minimally tender to palpation. No obvious foreign body noted.  Today's Vitals   08/07/22 1058  BP: (!) 207/85  Pulse: 63  SpO2: 100%  Weight: 162 lb 9.6 oz (73.8 kg)   Body mass index is 28.8 kg/m.        Assessment & Plan:  Puncture wound of left lower leg, initial encounter - Plan: Td vaccine greater than or equal to 7yo preservative free IM Meds ordered this encounter  Medications   cephALEXin (KEFLEX) 500 MG capsule    Sig: Take 1 capsule (500 mg total) by mouth 3 (three) times daily.    Dispense:  21 capsule    Refill:  0    Order Specific Question:   Supervising Provider    Answer:   Lilyan Punt A [[id number]]   Warm compresses to leg. Start antibiotics as directed. Td vaccine today. Last one unknown but at least several years ago. Warning signs reviewed. Call back if no improvement or worsening symptoms.

## 2022-08-09 LAB — MICROALBUMIN / CREATININE URINE RATIO
Creatinine, Urine: 70.1 mg/dL
Microalb/Creat Ratio: 10 mg/g creat (ref 0–29)
Microalbumin, Urine: 6.8 ug/mL

## 2022-08-09 LAB — BASIC METABOLIC PANEL
BUN/Creatinine Ratio: 16 (ref 12–28)
BUN: 20 mg/dL (ref 8–27)
CO2: 22 mmol/L (ref 20–29)
Calcium: 9.5 mg/dL (ref 8.7–10.3)
Chloride: 103 mmol/L (ref 96–106)
Creatinine, Ser: 1.25 mg/dL — ABNORMAL HIGH (ref 0.57–1.00)
Glucose: 96 mg/dL (ref 70–99)
Potassium: 4.5 mmol/L (ref 3.5–5.2)
Sodium: 138 mmol/L (ref 134–144)
eGFR: 43 mL/min/{1.73_m2} — ABNORMAL LOW (ref >59)

## 2022-08-14 ENCOUNTER — Ambulatory Visit (INDEPENDENT_AMBULATORY_CARE_PROVIDER_SITE_OTHER): Payer: Medicare Other | Admitting: Family Medicine

## 2022-08-14 VITALS — BP 146/90 | HR 69 | Temp 97.2°F | Ht 63.0 in | Wt 162.4 lb

## 2022-08-14 DIAGNOSIS — D582 Other hemoglobinopathies: Secondary | ICD-10-CM | POA: Diagnosis not present

## 2022-08-14 DIAGNOSIS — N1831 Chronic kidney disease, stage 3a: Secondary | ICD-10-CM | POA: Diagnosis not present

## 2022-08-14 DIAGNOSIS — I10 Essential (primary) hypertension: Secondary | ICD-10-CM

## 2022-08-14 DIAGNOSIS — E782 Mixed hyperlipidemia: Secondary | ICD-10-CM | POA: Diagnosis not present

## 2022-08-14 NOTE — Assessment & Plan Note (Signed)
Declines referral.  Stable.

## 2022-08-14 NOTE — Progress Notes (Signed)
Subjective:  Patient ID: Joanne White, female    DOB: 04-20-1940  Age: 82 y.o. MRN: [id number]  CC: Chief Complaint  Patient presents with   Chronic Kidney Disease   Follow-up    HPI:  82 year old female with the below mentioned past medical history presents for follow-up.  Patient is feeling well.  No chest pain or shortness of breath.  CKD stable.  Proteinuria improved.  Normal microalbumin to creatinine ratio.  Was previously elevated at 104.  Compliant with losartan.  Fair control of BP given advanced age.  Revisited elevated hemoglobin.  Patient does not want any intervention.  Health maintenance currently up-to-date.  Patient Active Problem List   Diagnosis Date Noted   Elevated hemoglobin (HCC) 12/01/2021   Allergic contact dermatitis due to other agents 10/20/2021   Mixed hyperlipidemia 02/14/2021   Allergy to alpha-gal 02/20/2019   White coat syndrome with diagnosis of hypertension    Chronic kidney disease, stage 3a (HCC)     Social Hx   Social History   Socioeconomic History   Marital status: Single    Spouse name: Not on file   Number of children: Not on file   Years of education: Not on file   Highest education level: Not on file  Occupational History   Occupation: retired  Tobacco Use   Smoking status: Former    Types: Cigarettes    Quit date: 03/03/2011    Years since quitting: 11.4   Smokeless tobacco: Never  Vaping Use   Vaping Use: Never used  Substance and Sexual Activity   Alcohol use: Yes    Comment: 1 beer once or twice a week and a rare mixed drink   Drug use: No   Sexual activity: Not Currently  Other Topics Concern   Not on file  Social History Narrative   Not on file   Social Determinants of Health   Financial Resource Strain: Low Risk  (05/05/2022)   Overall Financial Resource Strain (CARDIA)    Difficulty of Paying Living Expenses: Not hard at all  Food Insecurity: No Food Insecurity (05/05/2022)   Hunger Vital Sign     Worried About Running Out of Food in the Last Year: Never true    Ran Out of Food in the Last Year: Never true  Transportation Needs: No Transportation Needs (05/05/2022)   PRAPARE - Administrator, Civil Service (Medical): No    Lack of Transportation (Non-Medical): No  Physical Activity: Insufficiently Active (05/05/2022)   Exercise Vital Sign    Days of Exercise per Week: 3 days    Minutes of Exercise per Session: 30 min  Stress: No Stress Concern Present (05/05/2022)   [employer] of Occupational Health - Occupational Stress Questionnaire    Feeling of Stress : Not at all  Social Connections: Moderately Isolated (05/05/2022)   Social Connection and Isolation Panel [NHANES]    Frequency of Communication with Friends and Family: More than three times a week    Frequency of Social Gatherings with Friends and Family: Three times a week    Attends Religious Services: 1 to 4 times per year    Active Member of Clubs or Organizations: No    Attends Banker Meetings: Never    Marital Status: Never married    Review of Systems Per HPI  Objective:  BP (!) 146/90   Pulse 69   Temp (!) 97.2 F (36.2 C)   Ht 5\' 3"  (1.6 m)  Wt 162 lb 6.4 oz (73.7 kg)   SpO2 99%   BMI 28.77 kg/m      08/14/2022    9:50 AM 08/07/2022   10:58 AM 06/17/2022   10:05 AM  BP/Weight  Systolic BP 146 207 194  Diastolic BP 90 85 79  Wt. (Lbs) 162.4 162.6   BMI 28.77 kg/m2 28.8 kg/m2     Physical Exam Vitals and nursing note reviewed.  Constitutional:      General: She is not in acute distress.    Appearance: Normal appearance.  HENT:     Head: Normocephalic and atraumatic.  Eyes:     General:        Right eye: No discharge.        Left eye: No discharge.     Conjunctiva/sclera: Conjunctivae normal.  Cardiovascular:     Rate and Rhythm: Normal rate and regular rhythm.  Pulmonary:     Effort: Pulmonary effort is normal.     Breath sounds: Normal breath sounds. No  wheezing, rhonchi or rales.  Neurological:     Mental Status: She is alert.  Psychiatric:        Mood and Affect: Mood normal.        Behavior: Behavior normal.     Lab Results  Component Value Date   WBC 9.2 03/06/2022   HGB 16.8 (H) 03/06/2022   HCT 49.5 (H) 03/06/2022   PLT 269 03/06/2022   GLUCOSE 96 08/07/2022   CHOL 216 (H) 03/06/2022   TRIG 152 (H) 03/06/2022   HDL 73 03/06/2022   LDLCALC 117 (H) 03/06/2022   ALT 21 03/06/2022   AST 23 03/06/2022   NA 138 08/07/2022   K 4.5 08/07/2022   CL 103 08/07/2022   CREATININE 1.25 (H) 08/07/2022   BUN 20 08/07/2022   CO2 22 08/07/2022   TSH 3.34 09/05/2018   INR 1.06 03/04/2014   HGBA1C 5.5 06/22/2020     Assessment & Plan:   Problem List Items Addressed This Visit       Cardiovascular and Mediastinum   White coat syndrome with diagnosis of hypertension - Primary    Fair control.  Continue losartan.        Genitourinary   Chronic kidney disease, stage 3a (HCC)    Stable.  Continue losartan.        Other   Elevated hemoglobin (HCC)    Declines referral.  Stable.      Mixed hyperlipidemia    Will continue to monitor.  Patient previously declined intervention.       Follow-up:  6 months  Shamar Engelmann Adriana Simas DO [facility]

## 2022-08-14 NOTE — Assessment & Plan Note (Signed)
Fair control.  Continue losartan.

## 2022-08-14 NOTE — Assessment & Plan Note (Signed)
Stable.  Continue losartan. 

## 2022-08-14 NOTE — Assessment & Plan Note (Signed)
Will continue to monitor.  Patient previously declined intervention.

## 2022-08-14 NOTE — Patient Instructions (Signed)
Continue your medications.    Follow up in 6 months

## 2022-11-16 ENCOUNTER — Ambulatory Visit: Payer: Medicare Other

## 2022-11-16 DIAGNOSIS — Z23 Encounter for immunization: Secondary | ICD-10-CM | POA: Diagnosis not present

## 2022-12-22 ENCOUNTER — Emergency Department (HOSPITAL_COMMUNITY): Payer: Medicare Other

## 2022-12-22 ENCOUNTER — Encounter (HOSPITAL_COMMUNITY): Payer: Self-pay

## 2022-12-22 ENCOUNTER — Other Ambulatory Visit: Payer: Self-pay

## 2022-12-22 ENCOUNTER — Observation Stay (HOSPITAL_COMMUNITY)
Admission: EM | Admit: 2022-12-22 | Discharge: 2022-12-25 | Disposition: A | Discharge status: Home / Self Care | Payer: Medicare Other | Attending: Family Medicine | Admitting: Family Medicine

## 2022-12-22 DIAGNOSIS — G459 Transient cerebral ischemic attack, unspecified: Principal | ICD-10-CM | POA: Diagnosis present

## 2022-12-22 DIAGNOSIS — N182 Chronic kidney disease, stage 2 (mild): Secondary | ICD-10-CM | POA: Insufficient documentation

## 2022-12-22 DIAGNOSIS — I1310 Hypertensive heart and chronic kidney disease without heart failure, with stage 1 through stage 4 chronic kidney disease, or unspecified chronic kidney disease: Secondary | ICD-10-CM | POA: Insufficient documentation

## 2022-12-22 DIAGNOSIS — Z8249 Family history of ischemic heart disease and other diseases of the circulatory system: Secondary | ICD-10-CM | POA: Diagnosis not present

## 2022-12-22 DIAGNOSIS — I700 Atherosclerosis of aorta: Secondary | ICD-10-CM | POA: Diagnosis not present

## 2022-12-22 DIAGNOSIS — S30861A Insect bite (nonvenomous) of abdominal wall, initial encounter: Secondary | ICD-10-CM | POA: Diagnosis not present

## 2022-12-22 DIAGNOSIS — R2689 Other abnormalities of gait and mobility: Secondary | ICD-10-CM | POA: Insufficient documentation

## 2022-12-22 DIAGNOSIS — W57XXXA Bitten or stung by nonvenomous insect and other nonvenomous arthropods, initial encounter: Secondary | ICD-10-CM | POA: Diagnosis not present

## 2022-12-22 DIAGNOSIS — I771 Stricture of artery: Secondary | ICD-10-CM | POA: Diagnosis not present

## 2022-12-22 DIAGNOSIS — I129 Hypertensive chronic kidney disease with stage 1 through stage 4 chronic kidney disease, or unspecified chronic kidney disease: Secondary | ICD-10-CM | POA: Insufficient documentation

## 2022-12-22 DIAGNOSIS — I6503 Occlusion and stenosis of bilateral vertebral arteries: Secondary | ICD-10-CM | POA: Diagnosis not present

## 2022-12-22 DIAGNOSIS — Z79899 Other long term (current) drug therapy: Secondary | ICD-10-CM | POA: Insufficient documentation

## 2022-12-22 DIAGNOSIS — G9341 Metabolic encephalopathy: Secondary | ICD-10-CM | POA: Insufficient documentation

## 2022-12-22 DIAGNOSIS — I6782 Cerebral ischemia: Secondary | ICD-10-CM | POA: Insufficient documentation

## 2022-12-22 DIAGNOSIS — E782 Mixed hyperlipidemia: Secondary | ICD-10-CM | POA: Diagnosis not present

## 2022-12-22 DIAGNOSIS — R410 Disorientation, unspecified: Secondary | ICD-10-CM | POA: Diagnosis present

## 2022-12-22 DIAGNOSIS — G319 Degenerative disease of nervous system, unspecified: Secondary | ICD-10-CM | POA: Diagnosis not present

## 2022-12-22 DIAGNOSIS — M6281 Muscle weakness (generalized): Secondary | ICD-10-CM | POA: Insufficient documentation

## 2022-12-22 DIAGNOSIS — W57XXXD Bitten or stung by nonvenomous insect and other nonvenomous arthropods, subsequent encounter: Secondary | ICD-10-CM

## 2022-12-22 DIAGNOSIS — Z87891 Personal history of nicotine dependence: Secondary | ICD-10-CM | POA: Insufficient documentation

## 2022-12-22 DIAGNOSIS — I160 Hypertensive urgency: Secondary | ICD-10-CM | POA: Diagnosis present

## 2022-12-22 DIAGNOSIS — R2681 Unsteadiness on feet: Secondary | ICD-10-CM | POA: Diagnosis not present

## 2022-12-22 DIAGNOSIS — I10 Essential (primary) hypertension: Secondary | ICD-10-CM | POA: Diagnosis present

## 2022-12-22 DIAGNOSIS — R4701 Aphasia: Secondary | ICD-10-CM

## 2022-12-22 LAB — CBC
HCT: 47.1 % — ABNORMAL HIGH (ref 36.0–46.0)
Hemoglobin: 16.5 g/dL — ABNORMAL HIGH (ref 12.0–15.0)
MCH: 34.4 pg — ABNORMAL HIGH (ref 26.0–34.0)
MCHC: 35 g/dL (ref 30.0–36.0)
MCV: 98.3 fL (ref 80.0–100.0)
Platelets: 242 10*3/uL (ref 150–400)
RBC: 4.79 MIL/uL (ref 3.87–5.11)
RDW: 12.3 % (ref 11.5–15.5)
WBC: 9 10*3/uL (ref 4.0–10.5)
nRBC: 0 % (ref 0.0–0.2)

## 2022-12-22 LAB — ETHANOL: Alcohol, Ethyl (B): <10 mg/dL (ref <10)

## 2022-12-22 LAB — RAPID URINE DRUG SCREEN, HOSP PERFORMED
Amphetamines: NOT DETECTED
Barbiturates: NOT DETECTED
Benzodiazepines: NOT DETECTED
Cocaine: NOT DETECTED
Opiates: NOT DETECTED
Tetrahydrocannabinol: NOT DETECTED

## 2022-12-22 LAB — URINALYSIS, ROUTINE W REFLEX MICROSCOPIC
Bacteria, UA: NONE SEEN
Bilirubin Urine: NEGATIVE
Glucose, UA: NEGATIVE mg/dL
Ketones, ur: NEGATIVE mg/dL
Leukocytes,Ua: NEGATIVE
Nitrite: NEGATIVE
Protein, ur: NEGATIVE mg/dL
Specific Gravity, Urine: 1.04 — ABNORMAL HIGH (ref 1.005–1.030)
pH: 6 (ref 5.0–8.0)

## 2022-12-22 LAB — COMPREHENSIVE METABOLIC PANEL
ALT: 24 U/L (ref 0–44)
AST: 21 U/L (ref 15–41)
Albumin: 4.1 g/dL (ref 3.5–5.0)
Alkaline Phosphatase: 64 U/L (ref 38–126)
Anion gap: 8 (ref 5–15)
BUN: 16 mg/dL (ref 8–23)
CO2: 25 mmol/L (ref 22–32)
Calcium: 9.1 mg/dL (ref 8.9–10.3)
Chloride: 105 mmol/L (ref 98–111)
Creatinine, Ser: 1.17 mg/dL — ABNORMAL HIGH (ref 0.44–1.00)
GFR, Estimated: 47 mL/min — ABNORMAL LOW (ref >60)
Glucose, Bld: 150 mg/dL — ABNORMAL HIGH (ref 70–99)
Potassium: 3.5 mmol/L (ref 3.5–5.1)
Sodium: 138 mmol/L (ref 135–145)
Total Bilirubin: 0.6 mg/dL (ref <1.2)
Total Protein: 7 g/dL (ref 6.5–8.1)

## 2022-12-22 LAB — DIFFERENTIAL
Abs Immature Granulocytes: 0.03 10*3/uL (ref 0.00–0.07)
Basophils Absolute: 0.1 10*3/uL (ref 0.0–0.1)
Basophils Relative: 1 %
Eosinophils Absolute: 0.1 10*3/uL (ref 0.0–0.5)
Eosinophils Relative: 1 %
Immature Granulocytes: 0 %
Lymphocytes Relative: 18 %
Lymphs Abs: 1.6 10*3/uL (ref 0.7–4.0)
Monocytes Absolute: 0.5 10*3/uL (ref 0.1–1.0)
Monocytes Relative: 6 %
Neutro Abs: 6.7 10*3/uL (ref 1.7–7.7)
Neutrophils Relative %: 74 %

## 2022-12-22 LAB — PROTIME-INR
INR: 1 (ref 0.8–1.2)
Prothrombin Time: 13.8 s (ref 11.4–15.2)

## 2022-12-22 LAB — CBG MONITORING, ED: Glucose-Capillary: 108 mg/dL — ABNORMAL HIGH (ref 70–99)

## 2022-12-22 LAB — APTT: aPTT: 29 s (ref 24–36)

## 2022-12-22 MED ORDER — HYDRALAZINE HCL 20 MG/ML IJ SOLN
5.0000 mg | Freq: Once | INTRAMUSCULAR | Status: DC
  Filled 2022-12-22: qty 1

## 2022-12-22 MED ORDER — IOHEXOL 350 MG/ML SOLN
75.0000 mL | Freq: Once | INTRAVENOUS | Status: AC | PRN
  Administered 2022-12-22: 75 mL via INTRAVENOUS

## 2022-12-22 NOTE — ED Notes (Signed)
Patient transported to CT 

## 2022-12-22 NOTE — ED Provider Notes (Signed)
[place] EMERGENCY DEPARTMENT AT [facility] Provider Note  CSN: [id number] Arrival date & time: 12/22/22 0347  Chief Complaint(s) No chief complaint on file.  HPI Joanne White is a 82 y.o. female with past medical history as below, significant for cholecystectomy, CKD stage II, dizziness, hypertension, hypokalemia who presents to the ED with complaint of speech change  Patient here with family, report around 92 PM/lunchtime today she was having difficulty speaking.  She denies any weakness, vision changes, numbness or tingling to extremities.  Family spoke to her around 5 to 5:30 PM and she was symptomatic but the symptoms were improving per patient and family.  She is currently back to baseline, no longer having any speech difficulty.  Feeling reports that she was speaking words that did not make any sense was having difficulty completing a sentence.  Speech was not slurred.  Speech appears to be normal at this time per family and patient.  Denies any similar complaints in the past.  Compliant with home medications, blood pressure elevated on arrival.  Symptoms improved, exam is nonfocal on assessment, VAN negative.  Does not meet criteria for stroke alert activation  Past Medical History Past Medical History:  Diagnosis Date   Abnormal weight loss    Acute sinusitis, unspecified    Allergy    bananas, codeine; seasonal allergies-tree pollen   Anemia, unspecified    Cholelithiasis 03/03/2014   -status post cholecystectomy   Chronic kidney disease, stage 2 (mild)    Disappearance and death of family member    Dizziness and giddiness    Essential (primary) hypertension    HTN (hypertension), benign 03/03/2014   Hyperkalemia    Hypertension    Hypertensive chronic kidney disease with stage 1 through stage 4 chronic kidney disease, or unspecified chronic kidney disease    Pneumonia, unspecified organism    Tinnitus, right ear    Vertigo    Patient Active Problem List    Diagnosis Date Noted   Elevated hemoglobin (HCC) 12/01/2021   Allergic contact dermatitis due to other agents 10/20/2021   Mixed hyperlipidemia 02/14/2021   Allergy to alpha-gal 02/20/2019   White coat syndrome with diagnosis of hypertension    Chronic kidney disease, stage 3a (HCC)    Home Medication(s) Prior to Admission medications   Medication Sig Start Date End Date Taking? Authorizing Provider  cephALEXin (KEFLEX) 500 MG capsule Take 1 capsule (500 mg total) by mouth 3 (three) times daily. 08/07/22   Campbell Riches, NP  cholecalciferol (VITAMIN D3) 25 MCG (1000 UNIT) tablet Take 1,000 Units by mouth daily.    [provider]  losartan (COZAAR) 50 MG tablet Take 1 tablet (50 mg total) by mouth daily. 04/28/22   Tommie Sams, DO  meclizine (ANTIVERT) 25 MG tablet Take 0.5-1 tablets (12.5-25 mg total) by mouth 3 (three) times daily as needed for dizziness. 05/12/22   Tommie Sams, DO  Omega-3 Fatty Acids (FISH OIL) 1000 MG CAPS Take by mouth daily.    [provider]  Past Surgical History Past Surgical History:  Procedure Laterality Date   CHOLECYSTECTOMY  03/04/2014   Procedure: LAPAROSCOPIC CHOLECYSTECTOMY;  Surgeon: Romie Levee, MD;  Location: [facility] ORS;  Service: General;;   NO PAST SURGERIES     Family History Family History  Problem Relation Age of Onset   Hypertension Father    Cancer Father        lung   Heart attack Brother    Hypertension Brother     Social History Social History   Tobacco Use   Smoking status: Former    Current packs/day: 0.00    Types: Cigarettes    Quit date: 03/03/2011    Years since quitting: 11.8   Smokeless tobacco: Never  Vaping Use   Vaping status: Never Used  Substance Use Topics   Alcohol use: Yes    Comment: 1 beer once or twice a week and a rare mixed drink   Drug use: No    Allergies Banana and Codeine  Review of Systems Review of Systems  Constitutional:  Negative for chills and fatigue.  Respiratory:  Negative for chest tightness and shortness of breath.   Cardiovascular:  Negative for chest pain and palpitations.  Gastrointestinal:  Negative for abdominal pain, nausea and vomiting.  Genitourinary:  Negative for dysuria.  Musculoskeletal:  Negative for back pain.  Skin:  Negative for rash.  Neurological:  Positive for speech difficulty. Negative for numbness and headaches.  All other systems reviewed and are negative.   Physical Exam Vital Signs  I have reviewed the triage vital signs BP (!) 223/90   Pulse 85   Temp 98.4 F (36.9 C) (Oral)   Resp 17   Ht 5\' 3"  (1.6 m)   Wt 74.8 kg   SpO2 98%   BMI 29.23 kg/m  Physical Exam Vitals and nursing note reviewed. Exam conducted with a chaperone present.  Constitutional:      General: She is not in acute distress.    Appearance: Normal appearance.  HENT:     Head: Normocephalic and atraumatic.     Right Ear: External ear normal.     Left Ear: External ear normal.     Nose: Nose normal.     Mouth/Throat:     Mouth: Mucous membranes are moist.  Eyes:     General: No visual field deficit or scleral icterus.       Right eye: No discharge.        Left eye: No discharge.     Extraocular Movements: Extraocular movements intact.     Pupils: Pupils are equal, round, and reactive to light.  Cardiovascular:     Rate and Rhythm: Normal rate and regular rhythm.     Pulses: Normal pulses.     Heart sounds: Normal heart sounds.  Pulmonary:     Effort: Pulmonary effort is normal. No respiratory distress.     Breath sounds: Normal breath sounds. No stridor.  Abdominal:     General: Abdomen is flat. There is no distension.     Palpations: Abdomen is soft.     Tenderness: There is no abdominal tenderness.  Musculoskeletal:     Cervical back: Normal range of motion. No rigidity.     Right lower  leg: No edema.     Left lower leg: No edema.  Skin:    General: Skin is warm and dry.     Capillary Refill: Capillary refill takes less than 2 seconds.  Neurological:     Mental  Status: She is alert and oriented to person, place, and time.     GCS: GCS eye subscore is 4. GCS verbal subscore is 5. GCS motor subscore is 6.     Cranial Nerves: Cranial nerves 2-12 are intact. No dysarthria or facial asymmetry.     Sensory: Sensation is intact. No sensory deficit.     Motor: Motor function is intact. No tremor or pronator drift.     Coordination: Coordination is intact. Finger-Nose-Finger Test normal.     Gait: Gait is intact.     Comments: Strength 5/5 BLUE BLLE Speech fluent   Psychiatric:        Mood and Affect: Mood normal.        Behavior: Behavior normal. Behavior is cooperative.     ED Results and Treatments Labs (all labs ordered are listed, but only abnormal results are displayed) Labs Reviewed  CBG MONITORING, ED - Abnormal; Notable for the following components:      Result Value   Glucose-Capillary 108 (*)    All other components within normal limits                                                                                                                          Radiology No results found.  Pertinent labs & imaging results that were available during my care of the patient were reviewed by me and considered in my medical decision making (see MDM for details).  Medications Ordered in ED Medications - No data to display                                                                                                                                   Procedures Procedures  (including critical care time)  Medical Decision Making / ED Course    Medical Decision Making:    RAELEN MEISLER is a 82 y.o. female with past medical history as below, significant for cholecystectomy, CKD stage II, dizziness, hypertension, hypokalemia who presents to the ED with complaint  of speech change. The complaint involves an extensive differential diagnosis and also carries with it a high risk of complications and morbidity.  Serious etiology was considered. Ddx includes but is not limited to: Differential diagnoses for altered mental status includes but is not exclusive to alcohol, illicit or prescription medications, intracranial pathology such as stroke, intracerebral hemorrhage, fever or infectious causes including sepsis, hypoxemia, uremia,  trauma, endocrine related disorders such as diabetes, hypoglycemia, thyroid-related diseases, etc.   Complete initial physical exam performed, notably the patient  was currently asymptomatic.    Reviewed and confirmed nursing documentation for past medical history, family history, social history.  Vital signs reviewed.        Not meet criteria for stroke alert given resolution of symptoms.  She is VAN negative.  No current dysarthria or aphasia.  NIH stroke scale is 0  I am concerned for possible TIA, will get screening labs and imaging.   ABCD2 score is 5                 Additional history obtained: -Additional history obtained from {wsadditionalhistorian:28072} -External records from outside source obtained and reviewed including: Chart review including previous notes, labs, imaging, consultation notes including  ***   Lab Tests: -I ordered, reviewed, and interpreted labs.   The pertinent results include:   Labs Reviewed  CBG MONITORING, ED - Abnormal; Notable for the following components:      Result Value   Glucose-Capillary 108 (*)    All other components within normal limits    Notable for ***  EKG   EKG Interpretation Date/Time:    Ventricular Rate:    PR Interval:    QRS Duration:    QT Interval:    QTC Calculation:   R Axis:      Text Interpretation:           Imaging Studies ordered: I ordered imaging studies including *** I independently visualized the following imaging with  scope of interpretation limited to determining acute life threatening conditions related to emergency care; findings noted above I independently visualized and interpreted imaging. I agree with the radiologist interpretation   Medicines ordered and prescription drug management: No orders of the defined types were placed in this encounter.   -I have reviewed the patients home medicines and have made adjustments as needed   Consultations Obtained: I requested consultation with the ***,  and discussed lab and imaging findings as well as pertinent plan - they recommend: ***   Cardiac Monitoring: The patient was maintained on a cardiac monitor.  I personally viewed and interpreted the cardiac monitored which showed an underlying rhythm of: *** Continuous pulse oximetry interpreted by myself, ***% on ***.    Social Determinants of Health:  Diagnosis or treatment significantly limited by social determinants of health: {wssoc:28071}   Reevaluation: After the interventions noted above, I reevaluated the patient and found that they have {resolved/improved/worsened:23923::"improved"}  Co morbidities that complicate the patient evaluation  Past Medical History:  Diagnosis Date   Abnormal weight loss    Acute sinusitis, unspecified    Allergy    bananas, codeine; seasonal allergies-tree pollen   Anemia, unspecified    Cholelithiasis 03/03/2014   -status post cholecystectomy   Chronic kidney disease, stage 2 (mild)    Disappearance and death of family member    Dizziness and giddiness    Essential (primary) hypertension    HTN (hypertension), benign 03/03/2014   Hyperkalemia    Hypertension    Hypertensive chronic kidney disease with stage 1 through stage 4 chronic kidney disease, or unspecified chronic kidney disease    Pneumonia, unspecified organism    Tinnitus, right ear    Vertigo       Dispostion: Disposition decision including need for hospitalization was considered, and  patient {wsdispo:28070::"discharged from emergency department."}    Final Clinical Impression(s) / ED Diagnoses Final diagnoses:  None

## 2022-12-22 NOTE — ED Triage Notes (Signed)
Starting around 4pm brother called pt Pt was unable to complete sentences and thoughts were all over the place. He was unable to understand whet she was saying. Stated that she was stating random words.  Altered per family   Last seen "normal" at 11am  Pt answering questions Pt stated she can not get anything straight and was unable to get words out  Pt denies blurred and dizziness   BGL in triage 108  Informed MD Dr. Wallace Cullens in triage  Per MD not calling stroke alert

## 2022-12-23 ENCOUNTER — Observation Stay (HOSPITAL_COMMUNITY): Payer: Medicare Other

## 2022-12-23 ENCOUNTER — Observation Stay (HOSPITAL_BASED_OUTPATIENT_CLINIC_OR_DEPARTMENT_OTHER): Payer: Medicare Other

## 2022-12-23 DIAGNOSIS — G459 Transient cerebral ischemic attack, unspecified: Secondary | ICD-10-CM | POA: Diagnosis not present

## 2022-12-23 DIAGNOSIS — E782 Mixed hyperlipidemia: Secondary | ICD-10-CM

## 2022-12-23 DIAGNOSIS — G9341 Metabolic encephalopathy: Secondary | ICD-10-CM | POA: Diagnosis not present

## 2022-12-23 DIAGNOSIS — W57XXXA Bitten or stung by nonvenomous insect and other nonvenomous arthropods, initial encounter: Secondary | ICD-10-CM | POA: Diagnosis present

## 2022-12-23 DIAGNOSIS — S22039A Unspecified fracture of third thoracic vertebra, initial encounter for closed fracture: Secondary | ICD-10-CM | POA: Diagnosis not present

## 2022-12-23 DIAGNOSIS — I700 Atherosclerosis of aorta: Secondary | ICD-10-CM | POA: Diagnosis not present

## 2022-12-23 DIAGNOSIS — I159 Secondary hypertension, unspecified: Secondary | ICD-10-CM | POA: Diagnosis not present

## 2022-12-23 DIAGNOSIS — I517 Cardiomegaly: Secondary | ICD-10-CM | POA: Diagnosis not present

## 2022-12-23 DIAGNOSIS — R29818 Other symptoms and signs involving the nervous system: Secondary | ICD-10-CM | POA: Diagnosis not present

## 2022-12-23 LAB — ECHOCARDIOGRAM COMPLETE
AR max vel: 3.17 cm2
AV Area VTI: 2.79 cm2
AV Area mean vel: 2.83 cm2
AV Mean grad: 3 mm[Hg]
AV Peak grad: 5.2 mm[Hg]
Ao pk vel: 1.14 m/s
Area-P 1/2: 2.6 cm2
Height: 63 in
S' Lateral: 2.5 cm
Weight: 2640 [oz_av]

## 2022-12-23 LAB — LIPID PANEL
Cholesterol: 187 mg/dL (ref 0–200)
HDL: 61 mg/dL (ref >40)
LDL Cholesterol: 112 mg/dL — ABNORMAL HIGH (ref 0–99)
Total CHOL/HDL Ratio: 3.1 {ratio}
Triglycerides: 71 mg/dL (ref <150)
VLDL: 14 mg/dL (ref 0–40)

## 2022-12-23 LAB — CSF CELL COUNT WITH DIFFERENTIAL
Eosinophils, CSF: 0 % (ref 0–1)
Lymphs, CSF: 12 % — ABNORMAL LOW (ref 40–80)
Monocyte-Macrophage-Spinal Fluid: 2 % — ABNORMAL LOW (ref 15–45)
Other Cells, CSF: NONE SEEN
RBC Count, CSF: 93 /mm3 — ABNORMAL HIGH
Segmented Neutrophils-CSF: 1 % (ref 0–6)
Tube #: 4
WBC, CSF: 9 /mm3 — ABNORMAL HIGH (ref 0–5)

## 2022-12-23 LAB — COMPREHENSIVE METABOLIC PANEL
ALT: 22 U/L (ref 0–44)
AST: 17 U/L (ref 15–41)
Albumin: 3.5 g/dL (ref 3.5–5.0)
Alkaline Phosphatase: 56 U/L (ref 38–126)
Anion gap: 7 (ref 5–15)
BUN: 14 mg/dL (ref 8–23)
CO2: 21 mmol/L — ABNORMAL LOW (ref 22–32)
Calcium: 8.6 mg/dL — ABNORMAL LOW (ref 8.9–10.3)
Chloride: 108 mmol/L (ref 98–111)
Creatinine, Ser: 1.1 mg/dL — ABNORMAL HIGH (ref 0.44–1.00)
GFR, Estimated: 50 mL/min — ABNORMAL LOW (ref >60)
Glucose, Bld: 97 mg/dL (ref 70–99)
Potassium: 3.6 mmol/L (ref 3.5–5.1)
Sodium: 136 mmol/L (ref 135–145)
Total Bilirubin: 0.8 mg/dL (ref <1.2)
Total Protein: 6.2 g/dL — ABNORMAL LOW (ref 6.5–8.1)

## 2022-12-23 LAB — CBC
HCT: 45.7 % (ref 36.0–46.0)
Hemoglobin: 15.8 g/dL — ABNORMAL HIGH (ref 12.0–15.0)
MCH: 33.8 pg (ref 26.0–34.0)
MCHC: 34.6 g/dL (ref 30.0–36.0)
MCV: 97.9 fL (ref 80.0–100.0)
Platelets: 152 10*3/uL (ref 150–400)
RBC: 4.67 MIL/uL (ref 3.87–5.11)
RDW: 12.1 % (ref 11.5–15.5)
WBC: 7.6 10*3/uL (ref 4.0–10.5)
nRBC: 0 % (ref 0.0–0.2)

## 2022-12-23 LAB — HEMOGLOBIN A1C
Hgb A1c MFr Bld: 5.3 % (ref 4.8–5.6)
Mean Plasma Glucose: 105.41 mg/dL

## 2022-12-23 LAB — PROTEIN AND GLUCOSE, CSF
Glucose, CSF: 57 mg/dL (ref 40–70)
Total  Protein, CSF: 55 mg/dL — ABNORMAL HIGH (ref 15–45)

## 2022-12-23 LAB — SEDIMENTATION RATE: Sed Rate: 6 mm/h (ref 0–22)

## 2022-12-23 LAB — C-REACTIVE PROTEIN: CRP: 0.7 mg/dL (ref <1.0)

## 2022-12-23 MED ORDER — LORAZEPAM 2 MG/ML IJ SOLN
1.0000 mg | Freq: Once | INTRAMUSCULAR | Status: AC
  Administered 2022-12-23: 1 mg via INTRAVENOUS
  Filled 2022-12-23: qty 1

## 2022-12-23 MED ORDER — STROKE: EARLY STAGES OF RECOVERY BOOK
Freq: Once | Status: AC
  Filled 2022-12-23: qty 1

## 2022-12-23 MED ORDER — SODIUM CHLORIDE 0.9 % IV SOLN
1.0000 g | INTRAVENOUS | Status: DC
  Administered 2022-12-23 – 2022-12-24 (×2): 1 g via INTRAVENOUS
  Filled 2022-12-23: qty 10

## 2022-12-23 MED ORDER — DOXYCYCLINE HYCLATE 100 MG IV SOLR
INTRAVENOUS | Status: AC
  Filled 2022-12-23 (×2): qty 100

## 2022-12-23 MED ORDER — DOXYCYCLINE HYCLATE 100 MG IV SOLR
200.0000 mg | Freq: Two times a day (BID) | INTRAVENOUS | Status: DC
  Administered 2022-12-23: 200 mg via INTRAVENOUS
  Filled 2022-12-23 (×4): qty 200

## 2022-12-23 MED ORDER — DIPHENHYDRAMINE HCL 25 MG PO CAPS
50.0000 mg | ORAL_CAPSULE | Freq: Once | ORAL | Status: AC
  Administered 2022-12-23: 50 mg via ORAL
  Filled 2022-12-23: qty 2

## 2022-12-23 MED ORDER — HYDRALAZINE HCL 20 MG/ML IJ SOLN
10.0000 mg | INTRAMUSCULAR | Status: DC | PRN
  Administered 2022-12-23 – 2022-12-24 (×2): 10 mg via INTRAVENOUS
  Filled 2022-12-23 (×2): qty 1

## 2022-12-23 MED ORDER — ACETAMINOPHEN 650 MG RE SUPP: 650.0000 mg | RECTAL | Status: DC | PRN

## 2022-12-23 MED ORDER — ACETAMINOPHEN 160 MG/5ML PO SOLN: 650.0000 mg | ORAL | Status: DC | PRN

## 2022-12-23 MED ORDER — ASPIRIN 81 MG PO TBEC
81.0000 mg | DELAYED_RELEASE_TABLET | Freq: Every day | ORAL | Status: DC
  Administered 2022-12-23 – 2022-12-25 (×3): 81 mg via ORAL
  Filled 2022-12-23 (×3): qty 1

## 2022-12-23 MED ORDER — SODIUM CHLORIDE 0.9 % IV SOLN: INTRAVENOUS | Status: DC

## 2022-12-23 MED ORDER — ATORVASTATIN CALCIUM 40 MG PO TABS
80.0000 mg | ORAL_TABLET | Freq: Every day | ORAL | Status: DC
  Administered 2022-12-23 – 2022-12-24 (×2): 80 mg via ORAL
  Filled 2022-12-23 (×2): qty 2

## 2022-12-23 MED ORDER — HEPARIN SODIUM (PORCINE) 5000 UNIT/ML IJ SOLN
5000.0000 [IU] | Freq: Three times a day (TID) | INTRAMUSCULAR | Status: DC
  Administered 2022-12-23 – 2022-12-24 (×3): 5000 [IU] via SUBCUTANEOUS
  Filled 2022-12-23 (×4): qty 1

## 2022-12-23 MED ORDER — LOSARTAN POTASSIUM 50 MG PO TABS
50.0000 mg | ORAL_TABLET | Freq: Every day | ORAL | Status: DC
Start: 2022-12-23 — End: 2022-12-25
  Administered 2022-12-23 – 2022-12-25 (×3): 50 mg via ORAL
  Filled 2022-12-23: qty 1
  Filled 2022-12-23: qty 2
  Filled 2022-12-23: qty 1

## 2022-12-23 MED ORDER — CLOPIDOGREL BISULFATE 75 MG PO TABS
150.0000 mg | ORAL_TABLET | Freq: Every day | ORAL | Status: DC
  Administered 2022-12-23: 150 mg via ORAL
  Filled 2022-12-23: qty 2

## 2022-12-23 MED ORDER — FAMOTIDINE 20 MG PO TABS
20.0000 mg | ORAL_TABLET | Freq: Every day | ORAL | Status: DC
Start: 2022-12-23 — End: 2022-12-25
  Administered 2022-12-23 – 2022-12-25 (×3): 20 mg via ORAL
  Filled 2022-12-23 (×3): qty 1

## 2022-12-23 MED ORDER — DOXYCYCLINE HYCLATE 100 MG PO TABS: 100.0000 mg | ORAL_TABLET | Freq: Two times a day (BID) | ORAL | Status: DC

## 2022-12-23 MED ORDER — SENNOSIDES-DOCUSATE SODIUM 8.6-50 MG PO TABS: 1.0000 | ORAL_TABLET | Freq: Every evening | ORAL | Status: DC | PRN

## 2022-12-23 MED ORDER — ACETAMINOPHEN 325 MG PO TABS: 650.0000 mg | ORAL_TABLET | ORAL | Status: DC | PRN

## 2022-12-23 NOTE — Progress Notes (Signed)
At 2058 Patient's BP is at 194/88, PRN hydralazine to be given. Upon educating patient about medicine, patient refused and told this nurse " My doctor told me I should not have any blood pressure medicine, you cannot give it to me, I know my rights, you have to talk to my doctor." Explained to her that her conversation with her PCP is not applicable at the moment since she is hospitalized and we will be following what her current treatments are, patient is strongly refusing. Of note, patient is alert and oriented x4 and can make her decisions. This nurse called the family  to try to talk to patient. Patient still strongly refuses while discussing with family on the phone. Family decided that they will come back to hospital to de-escalate situation with patient.  At 2159, family at bedside, able to talk to patient about treatment plan. Patient now agreeable to get hydralazine, latest BP at 181/78.

## 2022-12-23 NOTE — Assessment & Plan Note (Addendum)
Tick bite- Tick was found/identified on the lateral side of abdomen, was removed by nursing staff and while (very small but 2-3 mm in size, black multiple appendages)  -Concern for tick bite disease including Lyme and [facility] disease  - ID: Dr. Ilsa Iha was curb sided for consult recommended LP and initiation of doxycycline and Rocephin for now  -Dr. Eber Hong was gracious enough and agreed to perform the procedure at bedside   Serum and CSF serology has been ordered will follow accordingly  The procedure and findings were explained to the patient and family at bedside, informed consent was signed.Marland Kitchen

## 2022-12-23 NOTE — Evaluation (Addendum)
Physical Therapy Evaluation Patient Details Name: Joanne White MRN: [id number] DOB: 27-Jul-1940 Today's Date: 12/23/2022  History of Present Illness  Joanne White is a 82 y.o. female with medical history significant of hypertension, CKD, and more presents to the ED with a chief complaint of confusion.  Patient reports that she was feeling confused sometimes this afternoon.  She thinks her last known well was just after lunch.  Brother had last known her to be well at 20 AM.  When he spoke with her in the afternoon she was confused and he advised her to come into the hospital to get checked out.  Patient reports she knows she was confused.  She reports she would think 1 thing but then thinks something else without finishing the first thought.  This is how she describes it, but she cannot come up with the actual word confused.  This is one of the way she shows she suddenly still confused during the interview.  She also reports "when she got back home "but is not able to say where she was.  It seems like she was talking about when she got home from the hospital but clearly she is still here.  She reports she was not have any trouble swallowing, walking, seeing, numbness.  The ED provider also did not note any asymmetric physical exam findings.  Patient reports she feels fine now and she felt fine before the confusion.  She does realize during the exam when she still is subtly confused.  She says "I think I am getting confused again."  She then says "let me get this straight."  When she stops and thinks about it, she is able to get her fax drink.  Patient has no other complaints at this time.   Clinical Impression  Patient functioning near baseline for functional mobility and gait other than having minor difficulty supine to sitting in gurney with HOB flat, otherwise demonstrates normal strength, coordination for transfers and ambulating in room/hallway without loss of balance.  Plan:  Patient discharged  from physical therapy to care of nursing for ambulation daily as tolerated for length of stay.          If plan is discharge home, recommend the following: Help with stairs or ramp for entrance   Can travel by private vehicle        Equipment Recommendations None recommended by PT  Recommendations for Other Services       Functional Status Assessment Patient has not had a recent decline in their functional status     Precautions / Restrictions Precautions Precautions: None Restrictions Weight Bearing Restrictions: No      Mobility  Bed Mobility Overal bed mobility: Needs Assistance Bed Mobility: Supine to Sit     Supine to sit: Supervision, Modified independent (Device/Increase time)     General bed mobility comments: increased time with labord movment sitting up in gurney    Transfers Overall transfer level: Modified independent                      Ambulation/Gait Ambulation/Gait assistance: Modified independent (Device/Increase time) Gait Distance (Feet): 100 Feet Assistive device: None Gait Pattern/deviations: WFL(Within Functional Limits) Gait velocity: slightly decreased     General Gait Details: grossly WFL with good return for ambulating in room/hallway without loss of balance or need for an AD  Stairs            Wheelchair Mobility     Tilt Bed  Modified Rankin (Stroke Patients Only)       Balance Overall balance assessment: No apparent balance deficits (not formally assessed)                                           Pertinent Vitals/Pain Pain Assessment Pain Assessment: No/denies pain    Home Living Family/patient expects to be discharged to:: Private residence Living Arrangements: Alone Available Help at Discharge: Family;Available PRN/intermittently Type of Home: House Home Access: Ramped entrance;Stairs to enter Entrance Stairs-Rails: Right;Left;Can reach both Entrance Stairs-Number of  Steps: 2   Home Layout: One level Home Equipment: Agricultural consultant (2 wheels);Cane - single point;BSC/3in1;Shower seat - built in;Grab bars - tub/shower      Prior Function Prior Level of Function : Independent/Modified Independent;Driving             Mobility Comments: Tourist information centre manager, drives, does own shopping ADLs Comments: Independent     Extremity/Trunk Assessment   Upper Extremity Assessment Upper Extremity Assessment: Defer to OT evaluation    Lower Extremity Assessment Lower Extremity Assessment: Overall WFL for tasks assessed    Cervical / Trunk Assessment Cervical / Trunk Assessment: Normal  Communication   Communication Communication: No apparent difficulties Cueing Techniques: Verbal cues  Cognition Arousal: Alert Behavior During Therapy: WFL for tasks assessed/performed Overall Cognitive Status: Within Functional Limits for tasks assessed                                          General Comments      Exercises     Assessment/Plan    PT Assessment Patient does not need any further PT services  PT Problem List         PT Treatment Interventions      PT Goals (Current goals can be found in the Care Plan section)  Acute Rehab PT Goals Patient Stated Goal: return home with family to assist PT Goal Formulation: With patient/family Time For Goal Achievement: 12/23/22 Potential to Achieve Goals: Good    Frequency       Co-evaluation               AM-PAC PT "6 Clicks" Mobility  Outcome Measure Help needed turning from your back to your side while in a flat bed without using bedrails?: None Help needed moving from lying on your back to sitting on the side of a flat bed without using bedrails?: A Little Help needed moving to and from a bed to a chair (including a wheelchair)?: None Help needed standing up from a chair using your arms (e.g., wheelchair or bedside chair)?: None Help needed to walk in hospital room?:  None Help needed climbing 3-5 steps with a railing? : None 6 Click Score: 23    End of Session Equipment Utilized During Treatment: Gait belt Activity Tolerance: Patient tolerated treatment well Patient left: in chair;with call bell/phone within reach;with family/visitor present Nurse Communication: Mobility status PT Visit Diagnosis: Unsteadiness on feet (R26.81);Other abnormalities of gait and mobility (R26.89);Muscle weakness (generalized) (M62.81)    Time: 7062-3762 PT Time Calculation (min) (ACUTE ONLY): 28 min   Charges:   PT Evaluation $PT Eval Moderate Complexity: 1 Mod PT Treatments $Therapeutic Activity: 23-37 mins PT General Charges $$ ACUTE PT VISIT: 1 Visit  12:16 PM, 12/23/22 Ocie Bob, MPT Physical Therapist with [facility] [phone] office [phone] mobile phone

## 2022-12-23 NOTE — Progress Notes (Signed)
   Tick bite- Tick was found/identified on the lateral side of abdomen, was removed by nursing staff  (very small but 2-3 mm in size, black multiple appendages)  -Concern for tick bite disease including Lyme and [facility] disease  - ID: Dr. Ilsa Iha was curb sided for consult recommended LP with serology, and initiation of doxycycline and Rocephin for now  -Dr. Eber Hong was gracious enough and agreed to perform the procedure at bedside   Serum and CSF serology has been ordered and will follow accordingly  The procedure and findings were explained to the patient and family at bedside, informed consent was signed.Marland Kitchen    SIGNED: Kendell Bane, MD, FHM. FAAFP [facility],  Pager (please use [email or web address] to page/text)  Please use Epic Secure Chat for non-urgent communication (7AM-7PM) If 7PM-7AM, please contact night-coverage [email or web address],  12/23/2022, 4:46 PM

## 2022-12-23 NOTE — Progress Notes (Addendum)
Patient alert and verbal, received lumbar puncture at 1706 by MD Hyacinth Meeker. Assessed patients bandage with Lawanna Kobus RN, noted no drainage or bleeding. Patient verbalized no complaints of headache or pain at this time. Per MD Shahmehdi hold heparin dose tonight due to lumbar puncture. Informed on-coming nurse.Patient lying in bed with call light in reach.

## 2022-12-23 NOTE — Progress Notes (Signed)
   12/23/22 1513  Assess: MEWS Score  Temp 98.3 F (36.8 C)  BP (!) 205/73  Pulse Rate (!) 58  Resp 17  Level of Consciousness Alert  SpO2 100 %  O2 Device Room Air  Assess: MEWS Score  MEWS Temp 0  MEWS Systolic 2  MEWS Pulse 0  MEWS RR 0  MEWS LOC 0  MEWS Score 2  MEWS Score Color Yellow  Assess: if the MEWS score is Yellow or Red  Were vital signs accurate and taken at a resting state? Yes  MEWS guidelines implemented  No, previously yellow, continue vital signs every 4 hours  Notify: Charge Nurse/RN  Name of Charge Nurse/RN Notified Atilano Ina, RN  Provider Notification  Provider Name/Title Dr Flossie Dibble  Date Provider Notified 12/23/22  Time Provider Notified 1515  Method of Notification Page  Notification Reason Other (Comment) (yellow mews with elevated bp)  Assess: SIRS CRITERIA  SIRS Temperature  0  SIRS Pulse 0  SIRS Respirations  0  SIRS WBC 0  SIRS Score Sum  0

## 2022-12-23 NOTE — Assessment & Plan Note (Addendum)
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  -MRI in the AM -Lipid panel in the AM -Hgb A1C in the AM -Neuro checks -PT/OT/SLP eval and treat -Monitor on tele

## 2022-12-23 NOTE — ED Notes (Signed)
ED TO INPATIENT HANDOFF REPORT  ED Nurse Name and Phone #: Wandra Mannan, Paramedic  S Name/Age/Gender Joanne White 82 y.o. female Room/Bed: APA08/APA08  Code Status   Code Status: Full Code  Home/SNF/Other Home Patient oriented to: self, place, time, and situation Is this baseline? Yes   Triage Complete: Triage complete  Chief Complaint TIA (transient ischemic attack) [G45.9]  Triage Note Starting around 4pm brother called pt Pt was unable to complete sentences and thoughts were all over the place. He was unable to understand whet she was saying. Stated that she was stating random words.  Altered per family   Last seen "normal" at 11am  Pt answering questions Pt stated she can not get anything straight and was unable to get words out  Pt denies blurred and dizziness   BGL in triage 108  Informed MD Dr. Wallace Cullens in triage  Per MD not calling stroke alert    Allergies Allergies  Allergen Reactions   Banana     Stomach cramping and pain   Codeine Nausea And Vomiting    Level of Care/Admitting Diagnosis ED Disposition     ED Disposition  Admit   Condition  --   Comment  Hospital Area: [facility] [[id number]]  Level of Care: Telemetry [5]  Covid Evaluation: Asymptomatic - no recent exposure (last 10 days) testing not required  Diagnosis: TIA (transient ischemic attack) [147829]  Admitting Physician: Lilyan Gilford [[id number]]  Attending Physician: Lilyan Gilford [[id number]]          B Medical/Surgery History Past Medical History:  Diagnosis Date   Abnormal weight loss    Acute sinusitis, unspecified    Allergy    bananas, codeine; seasonal allergies-tree pollen   Anemia, unspecified    Cholelithiasis 03/03/2014   -status post cholecystectomy   Chronic kidney disease, stage 2 (mild)    Disappearance and death of family member    Dizziness and giddiness    Essential (primary) hypertension    HTN (hypertension), benign  03/03/2014   Hyperkalemia    Hypertension    Hypertensive chronic kidney disease with stage 1 through stage 4 chronic kidney disease, or unspecified chronic kidney disease    Pneumonia, unspecified organism    Tinnitus, right ear    Vertigo    Past Surgical History:  Procedure Laterality Date   CHOLECYSTECTOMY  03/04/2014   Procedure: LAPAROSCOPIC CHOLECYSTECTOMY;  Surgeon: Romie Levee, MD;  Location: [facility];  Service: General;;   NO PAST SURGERIES       A IV Location/Drains/Wounds Patient Lines/Drains/Airways Status     Active Line/Drains/Airways     Name Placement date Placement time Site Days   Peripheral IV 12/22/22 20 G Left Antecubital 12/22/22  2017  Antecubital  1            Intake/Output Last 24 hours  Intake/Output Summary (Last 24 hours) at 12/23/2022 1436 Last data filed at 12/23/2022 0800 Gross per 24 hour  Intake 221.23 ml  Output --  Net 221.23 ml    Labs/Imaging Results for orders placed or performed during the hospital encounter of 12/22/22 (from the past 48 hour(s))  CBG monitoring, ED     Status: Abnormal   Collection Time: 12/22/22  6:59 PM  Result Value Ref Range   Glucose-Capillary 108 (H) 70 - 99 mg/dL    Comment: Glucose reference range applies only to samples taken after fasting for at least 8 hours.  Ethanol     Status: None  Collection Time: 12/22/22  7:34 PM  Result Value Ref Range   Alcohol, Ethyl (B) <10 <10 mg/dL    Comment: (NOTE) Lowest detectable limit for serum alcohol is 10 mg/dL.  For medical purposes only. Performed at [facility], [address], [place], [place] [address]   Protime-INR     Status: None   Collection Time: 12/22/22  7:34 PM  Result Value Ref Range   Prothrombin Time 13.8 11.4 - 15.2 seconds   INR 1.0 0.8 - 1.2    Comment: (NOTE) INR goal varies based on device and disease states. Performed at [facility], [address], [place], [place] [address]   APTT     Status: None   Collection Time:  12/22/22  7:34 PM  Result Value Ref Range   aPTT 29 24 - 36 seconds    Comment: Performed at [facility], [address], [place], [place] [address]  CBC     Status: Abnormal   Collection Time: 12/22/22  7:34 PM  Result Value Ref Range   WBC 9.0 4.0 - 10.5 K/uL   RBC 4.79 3.87 - 5.11 MIL/uL   Hemoglobin 16.5 (H) 12.0 - 15.0 g/dL   HCT 21.3 (H) 08.6 - 57.8 %   MCV 98.3 80.0 - 100.0 fL   MCH 34.4 (H) 26.0 - 34.0 pg   MCHC 35.0 30.0 - 36.0 g/dL   RDW 46.9 62.9 - 52.8 %   Platelets 242 150 - 400 K/uL   nRBC 0.0 0.0 - 0.2 %    Comment: Performed at [facility], [address], [place], [place] [address]  Differential     Status: None   Collection Time: 12/22/22  7:34 PM  Result Value Ref Range   Neutrophils Relative % 74 %   Neutro Abs 6.7 1.7 - 7.7 K/uL   Lymphocytes Relative 18 %   Lymphs Abs 1.6 0.7 - 4.0 K/uL   Monocytes Relative 6 %   Monocytes Absolute 0.5 0.1 - 1.0 K/uL   Eosinophils Relative 1 %   Eosinophils Absolute 0.1 0.0 - 0.5 K/uL   Basophils Relative 1 %   Basophils Absolute 0.1 0.0 - 0.1 K/uL   Immature Granulocytes 0 %   Abs Immature Granulocytes 0.03 0.00 - 0.07 K/uL    Comment: Performed at [facility], [address], [place], [place] [address]  Comprehensive metabolic panel     Status: Abnormal   Collection Time: 12/22/22  7:34 PM  Result Value Ref Range   Sodium 138 135 - 145 mmol/L   Potassium 3.5 3.5 - 5.1 mmol/L   Chloride 105 98 - 111 mmol/L   CO2 25 22 - 32 mmol/L   Glucose, Bld 150 (H) 70 - 99 mg/dL    Comment: Glucose reference range applies only to samples taken after fasting for at least 8 hours.   BUN 16 8 - 23 mg/dL   Creatinine, Ser 7.25 (H) 0.44 - 1.00 mg/dL   Calcium 9.1 8.9 - 36.6 mg/dL   Total Protein 7.0 6.5 - 8.1 g/dL   Albumin 4.1 3.5 - 5.0 g/dL   AST 21 15 - 41 U/L   ALT 24 0 - 44 U/L   Alkaline Phosphatase 64 38 - 126 U/L   Total Bilirubin 0.6 <1.2 mg/dL   GFR, Estimated 47 (L) >60 mL/min    Comment: (NOTE) Calculated using  the CKD-EPI Creatinine Equation (2021)    Anion gap 8 5 - 15    Comment: Performed at  [facility], [address] [place], [place] [address]  Urine rapid drug screen (hosp performed)     Status: None   Collection Time: 12/22/22 10:07 PM  Result Value Ref Range   Opiates NONE DETECTED NONE DETECTED   Cocaine NONE DETECTED NONE DETECTED   Benzodiazepines NONE DETECTED NONE DETECTED   Amphetamines NONE DETECTED NONE DETECTED   Tetrahydrocannabinol NONE DETECTED NONE DETECTED   Barbiturates NONE DETECTED NONE DETECTED    Comment: (NOTE) DRUG SCREEN FOR MEDICAL PURPOSES ONLY.  IF CONFIRMATION IS NEEDED FOR ANY PURPOSE, NOTIFY LAB WITHIN 5 DAYS.  LOWEST DETECTABLE LIMITS FOR URINE DRUG SCREEN Drug Class                     Cutoff (ng/mL) Amphetamine and metabolites    1000 Barbiturate and metabolites    200 Benzodiazepine                 200 Opiates and metabolites        300 Cocaine and metabolites        300 THC                            50 Performed at [facility], [address] [place], [place] [address]   Urinalysis, Routine w reflex microscopic -Urine, Clean Catch     Status: Abnormal   Collection Time: 12/22/22 10:07 PM  Result Value Ref Range   Color, Urine STRAW (A) YELLOW   APPearance CLEAR CLEAR   Specific Gravity, Urine 1.040 (H) 1.005 - 1.030   pH 6.0 5.0 - 8.0   Glucose, UA NEGATIVE NEGATIVE mg/dL   Hgb urine dipstick SMALL (A) NEGATIVE   Bilirubin Urine NEGATIVE NEGATIVE   Ketones, ur NEGATIVE NEGATIVE mg/dL   Protein, ur NEGATIVE NEGATIVE mg/dL   Nitrite NEGATIVE NEGATIVE   Leukocytes,Ua NEGATIVE NEGATIVE   RBC / HPF 0-5 0 - 5 RBC/hpf   WBC, UA 0-5 0 - 5 WBC/hpf   Bacteria, UA NONE SEEN NONE SEEN   Squamous Epithelial / HPF 0-5 0 - 5 /HPF    Comment: Performed at [facility], [address], [place], [place] [address]  Lipid panel     Status: Abnormal   Collection Time: 12/23/22  4:52 AM  Result Value Ref Range   Cholesterol 187 0 - 200 mg/dL    Triglycerides 71 <102 mg/dL   HDL 61 >72 mg/dL   Total CHOL/HDL Ratio 3.1 RATIO   VLDL 14 0 - 40 mg/dL   LDL Cholesterol 536 (H) 0 - 99 mg/dL    Comment:        Total Cholesterol/HDL:CHD Risk Coronary Heart Disease Risk Table                     Men   Women  1/2 Average Risk   3.4   3.3  Average Risk       5.0   4.4  2 X Average Risk   9.6   7.1  3 X Average Risk  23.4   11.0        Use the calculated Patient Ratio above and the CHD Risk Table to determine the patient's CHD Risk.        ATP III CLASSIFICATION (LDL):  <100     mg/dL   Optimal  644-034  mg/dL   Near or Above  Optimal  130-159  mg/dL   Borderline  865-784  mg/dL   High  >696     mg/dL   Very High Performed at [facility], [address] [place], [place] [address]   Hemoglobin A1c     Status: None   Collection Time: 12/23/22  4:52 AM  Result Value Ref Range   Hgb A1c MFr Bld 5.3 4.8 - 5.6 %    Comment: (NOTE) Pre diabetes:          5.7%-6.4%  Diabetes:              >6.4%  Glycemic control for   <7.0% adults with diabetes    Mean Plasma Glucose 105.41 mg/dL    Comment: Performed at [facility], [address], [place], [place] [address]  Comprehensive metabolic panel     Status: Abnormal   Collection Time: 12/23/22  4:52 AM  Result Value Ref Range   Sodium 136 135 - 145 mmol/L   Potassium 3.6 3.5 - 5.1 mmol/L   Chloride 108 98 - 111 mmol/L   CO2 21 (L) 22 - 32 mmol/L   Glucose, Bld 97 70 - 99 mg/dL    Comment: Glucose reference range applies only to samples taken after fasting for at least 8 hours.   BUN 14 8 - 23 mg/dL   Creatinine, Ser 4.01 (H) 0.44 - 1.00 mg/dL   Calcium 8.6 (L) 8.9 - 10.3 mg/dL   Total Protein 6.2 (L) 6.5 - 8.1 g/dL   Albumin 3.5 3.5 - 5.0 g/dL   AST 17 15 - 41 U/L   ALT 22 0 - 44 U/L   Alkaline Phosphatase 56 38 - 126 U/L   Total Bilirubin 0.8 <1.2 mg/dL   GFR, Estimated 50 (L) >60 mL/min    Comment: (NOTE) Calculated using the CKD-EPI Creatinine  Equation (2021)    Anion gap 7 5 - 15    Comment: Performed at [facility], [address], [place], [place] [address]  CBC     Status: Abnormal   Collection Time: 12/23/22  4:52 AM  Result Value Ref Range   WBC 7.6 4.0 - 10.5 K/uL   RBC 4.67 3.87 - 5.11 MIL/uL   Hemoglobin 15.8 (H) 12.0 - 15.0 g/dL   HCT 36.6 44.0 - 34.7 %   MCV 97.9 80.0 - 100.0 fL   MCH 33.8 26.0 - 34.0 pg   MCHC 34.6 30.0 - 36.0 g/dL   RDW 42.5 95.6 - 38.7 %   Platelets 152 150 - 400 K/uL   nRBC 0.0 0.0 - 0.2 %    Comment: Performed at [facility], [address], [place], [place] [address]   MR BRAIN WO CONTRAST  Result Date: 12/23/2022 CLINICAL DATA:  82 year old female neurologic deficit. EXAM: MRI HEAD WITHOUT CONTRAST TECHNIQUE: Multiplanar, multiecho pulse sequences of the brain and surrounding structures were obtained without intravenous contrast. COMPARISON:  CT head, CTA head and neck yesterday. FINDINGS: Brain: Cerebral volume is within normal limits for age. No restricted diffusion to suggest acute infarction. No midline shift, mass effect, evidence of mass lesion, ventriculomegaly, extra-axial collection or acute intracranial hemorrhage. Cervicomedullary junction and pituitary are within normal limits. Patchy and confluent, widely scattered cerebral white matter T2 and FLAIR hyperintensity bilaterally. Mild-to-moderate similar heterogeneity in the pons, bilateral deep gray nuclei. No cortical encephalomalacia identified. No chronic cerebral blood products on SWI. Cerebellum appears negative. Vascular: Major intracranial vascular flow voids preserved except for the distal left vertebral  artery, concordant with abnormal CTA findings yesterday (please see that report) Skull and upper cervical spine: Normal for age visible cervical spine. Visualized bone marrow signal is within normal limits. Sinuses/Orbits: Negative orbits. Minor paranasal sinus mucosal thickening. Other: Visible internal auditory structures appear  normal. Mastoids appear clear. Normal stylomastoid foramina. Negative visible scalp and face. IMPRESSION: 1. No acute intracranial abnormality. 2. Moderate for age signal changes in the brain most commonly due to chronic small vessel disease. 3. Abnormal left vertebral artery flow void, concordant with CTA findings yesterday. Electronically Signed   By: Odessa Fleming M.D.   On: 12/23/2022 08:53   DG CHEST PORT 1 VIEW  Result Date: 12/23/2022 CLINICAL DATA:  MRI screening. EXAM: PORTABLE CHEST 1 VIEW COMPARISON:  None Available. FINDINGS: The heart is enlarged and mediastinal contours are within normal limits. There is mild atherosclerotic calcification of the aorta. No consolidation, effusion, or pneumothorax. There is a fracture of the T3 rib on the left, indeterminate in age. Overlying metallic snaps and leads are noted. No radiopaque foreign body is seen. IMPRESSION: 1. No radiopaque foreign body. 2. No acute cardiopulmonary process. 3. Fracture of the T3 rib on the left, indeterminate in age. 4. Cardiomegaly. Electronically Signed   By: Thornell Sartorius M.D.   On: 12/23/2022 04:07   CT ANGIO HEAD NECK W WO CM  Result Date: 12/23/2022 CLINICAL DATA:  Initial evaluation for neuro deficit, stroke suspected. EXAM: CT ANGIOGRAPHY HEAD AND NECK WITH AND WITHOUT CONTRAST TECHNIQUE: Multidetector CT imaging of the head and neck was performed using the standard protocol during bolus administration of intravenous contrast. Multiplanar CT image reconstructions and MIPs were obtained to evaluate the vascular anatomy. Carotid stenosis measurements (when applicable) are obtained utilizing NASCET criteria, using the distal internal carotid diameter as the denominator. RADIATION DOSE REDUCTION: This exam was performed according to the departmental dose-optimization program which includes automated exposure control, adjustment of the mA and/or kV according to patient size and/or use of iterative reconstruction technique.  CONTRAST:  75mL OMNIPAQUE IOHEXOL 350 MG/ML SOLN COMPARISON:  None Available. FINDINGS: CT HEAD FINDINGS Brain: Generalized age-related cerebral atrophy. Patchy and confluent hypodensity involving the supratentorial cerebral white matter, consistent with chronic small vessel ischemic disease, moderately advanced. No acute intracranial hemorrhage. No acute large vessel territory infarct. No mass lesion or midline shift. No hydrocephalus or extra-axial fluid collection. Vascular: No abnormal hyperdense vessel. Calcified atherosclerosis present at the skull base. Skull: Scalp soft tissues within normal limits.  Calvarium intact. Sinuses/Orbits: Globes and orbital soft tissues within normal limits. Paranasal sinuses are clear. No mastoid effusion. Other: None. Review of the MIP images confirms the above findings CTA NECK FINDINGS Aortic arch: Visualized arch within normal limits for caliber with standard branch pattern. Aortic atherosclerosis. No high-grade stenosis about the origin the great vessels. Right carotid system: Right common and internal carotid arteries are tortuous but patent without dissection. Moderate atheromatous change about the right carotid bulb without hemodynamically significant greater than 50% stenosis. Superimposed penetrating plaque noted at the proximal cervical right ICA (series 10, image 72). Left carotid system: Left common and internal carotid arteries are tortuous but patent without dissection. Mild atheromatous change about the left carotid bulb without hemodynamically significant greater than 50% stenosis. Vertebral arteries: Both vertebral arteries arise from subclavian arteries. Atheromatous change with up to 50% stenosis present at the origin of the right subclavian artery. Atheromatous change about the origins of the vertebral arteries bilaterally with severe stenoses. Right vertebral artery dominant and otherwise patent  within the neck. Left vertebral artery mildly diminutive and  becomes increasingly attenuated as it courses cephalad, likely due to the proximal stenosis. Additional short-segment moderate left V2 stenosis at the level of C3. No dissection. Skeleton: No discrete or worrisome osseous lesions. Moderate spondylosis present at C5-6. Other neck: No other acute finding. Upper chest: No other acute finding. Review of the MIP images confirms the above findings CTA HEAD FINDINGS Anterior circulation: Atheromatous change seen about the carotid siphons with associated up to mild narrowing bilaterally. Both A1 segments patent. Normal anterior communicating artery complex. Anterior cerebral arteries patent without stenosis. No M1 stenosis or occlusion. Distal MCA branches perfused and symmetric. Posterior circulation: Dominant right V4 segment widely patent. Right PICA patent. Left vertebral artery is attenuated but patent to the vertebrobasilar junction. Left PICA not seen. Basilar patent without significant stenosis. Superior cerebral arteries patent bilaterally. Right PCA primarily supplied via the basilar. Left PCA supplied via a hypoplastic left P1 segment and robust left posterior communicating artery. Both PCAs patent to their distal aspects without significant stenosis. Venous sinuses: Patent allowing for timing the contrast bolus. Anatomic variants: As above.  No aneurysm. Review of the MIP images confirms the above findings IMPRESSION: CT HEAD: 1. No acute intracranial abnormality. 2. Age-related cerebral atrophy with moderate chronic small vessel ischemic disease. CTA HEAD AND NECK: 1. Negative CTA for large vessel occlusion or other emergent finding. 2. Severe stenoses about the origins of the vertebral arteries bilaterally. Right vertebral artery dominant. Additional short-segment moderate left V2 stenosis at the level of C3. 3. Moderate atheromatous change about the right carotid bulb without hemodynamically significant greater than 50% stenosis. Superimposed penetrating  plaque at the proximal cervical right ICA. 4. Atheromatous change about the carotid siphons with associated mild multifocal narrowing. 5.  Aortic Atherosclerosis (ICD10-I70.0). Electronically Signed   By: Rise Mu M.D.   On: 12/23/2022 00:00    Pending Labs Unresulted Labs (From admission, onward)    None       Vitals/Pain Today's Vitals   12/23/22 1056 12/23/22 1056 12/23/22 1057 12/23/22 1430  BP:   (!) 183/80 (!) 152/68  Pulse: 62 60 60 (!) 53  Resp:  16 16   Temp:  98.2 F (36.8 C) 98.2 F (36.8 C)   TempSrc:  Oral Oral   SpO2: 97% 97% 98% 94%  Weight:      Height:      PainSc:  0-No pain 0-No pain     Isolation Precautions No active isolations  Medications Medications   stroke: early stages of recovery book (has no administration in time range)  acetaminophen (TYLENOL) tablet 650 mg (has no administration in time range)    Or  acetaminophen (TYLENOL) 160 MG/5ML solution 650 mg (has no administration in time range)    Or  acetaminophen (TYLENOL) suppository 650 mg (has no administration in time range)  senna-docusate (Senokot-S) tablet 1 tablet (has no administration in time range)  heparin injection 5,000 Units (5,000 Units Subcutaneous Given 12/23/22 1344)  0.9 %  sodium chloride infusion ( Intravenous New Bag/Given 12/23/22 1057)  aspirin EC tablet 81 mg (81 mg Oral Given 12/23/22 1052)  clopidogrel (PLAVIX) tablet 150 mg (150 mg Oral Given 12/23/22 1052)  atorvastatin (LIPITOR) tablet 80 mg (80 mg Oral Given 12/23/22 1049)  famotidine (PEPCID) tablet 20 mg (20 mg Oral Given 12/23/22 1050)  losartan (COZAAR) tablet 50 mg (has no administration in time range)  iohexol (OMNIPAQUE) 350 MG/ML injection 75 mL (75 mLs Intravenous  Contrast Given 12/22/22 2020)  LORazepam (ATIVAN) injection 1 mg (1 mg Intravenous Given 12/23/22 1610)    Mobility walks     Focused Assessments Neuro Assessment Handoff:  Swallow screen pass? Yes    NIH Stroke Scale   Dizziness Present: No Headache Present: No Interval: Neuro change Level of Consciousness (1a.)   : Alert, keenly responsive LOC Questions (1b. )   : Answers one question correctly LOC Commands (1c. )   : Performs both tasks correctly Best Gaze (2. )  : Normal Visual (3. )  : No visual loss Facial Palsy (4. )    : Normal symmetrical movements Motor Arm, Left (5a. )   : No drift Motor Arm, Right (5b. ) : No drift Motor Leg, Left (6a. )  : No drift Motor Leg, Right (6b. ) : No drift Limb Ataxia (7. ): Absent Sensory (8. )  : Normal, no sensory loss Best Language (9. )  : No aphasia Dysarthria (10. ): Normal Extinction/Inattention (11.)   : No Abnormality Complete NIHSS TOTAL: 1     Neuro Assessment: Exceptions to WDL Neuro Checks:   Other (Comment) (provider requested) (12/23/22 0154)  Has TPA been given? No If patient is a Neuro Trauma and patient is going to OR before floor call report to 4N Charge nurse: [phone] or [phone]   R Recommendations: See Admitting Provider Note  Report given to:   Additional Notes: NA

## 2022-12-23 NOTE — Assessment & Plan Note (Signed)
-  Confusion -CTA head without acute findings -MRI in the AM -Monitor on tele

## 2022-12-23 NOTE — Progress Notes (Signed)
Noted during assessment patient had tick bite to right upper abdomen. Area to skin red with some bruising, indention from tick bite. This Clinical research associate and charge nurse attempted to remove tick noted tick head still in patients skin, attempted to remove, cleaned area. Placed tick in specimen container. Md Shahmehdi made aware. New orders placed.

## 2022-12-23 NOTE — Assessment & Plan Note (Signed)
-  Lipid panel in the AM

## 2022-12-23 NOTE — Progress Notes (Signed)
*  PRELIMINARY RESULTS* Echocardiogram 2D Echocardiogram has been performed.  Joanne White 12/23/2022, 10:25 AM

## 2022-12-23 NOTE — Progress Notes (Signed)
   12/23/22 1709  TOC Brief Assessment  Insurance and Status Reviewed  Patient has primary care physician Yes  Home environment has been reviewed From Home  Prior level of function: Independent  Prior/Current Home Services No current home services  Social Determinants of Health Reivew SDOH reviewed no interventions necessary  Readmission risk has been reviewed Yes  Transition of care needs transition of care needs identified, TOC will continue to follow

## 2022-12-23 NOTE — Hospital Course (Signed)
Joanne White is a 82 y.o. female with medical history significant of hypertension, CKD, and more presents to the ED with a chief complaint of confusion.   Patient reports that she was feeling confused sometimes this afternoon.  She thinks her last known well was just after lunch.  Brother had last known her to be well at 50 AM.  When he spoke with her in the afternoon she was confused and he advised her to come into the hospital to get checked out.  Patient reports she knows she was confused.  She reports she would think 1 thing but then thinks something else without finishing the first thought.  This is how she describes it, but she cannot come up with the actual word confused.  This is one of the way she shows she suddenly still confused during the interview.   She reports she was not have any trouble swallowing, walking, seeing, numbness.  The ED provider also did not note any asymmetric physical exam findings.   Patient does not smoke and does not drink.  ED Patient is hypertensive up to 223/90 satting at 97% No leukocytosis Chemistry is unremarkable UA is not indicative UTI UDS is negative CTA shows severe stenosis of the vertebral arteries but no large vessel occlusions EKG shows a heart rate 93, sinus rhythm, QTc 442 Admission requested for TIA

## 2022-12-23 NOTE — H&P (Signed)
History and Physical    Patient: Joanne White ION:[id number] DOB: January 09, 1941 DOA: 12/22/2022 DOS: the patient was seen and examined on 12/23/2022 PCP: Tommie Sams, DO  Patient coming from: Home  Chief Complaint:  Chief Complaint  Patient presents with   Cerebrovascular Accident   HPI: Joanne White is a 82 y.o. female with medical history significant of hypertension, CKD, and more presents to the ED with a chief complaint of confusion.  Patient reports that she was feeling confused sometimes this afternoon.  She thinks her last known well was just after lunch.  Brother had last known her to be well at 47 AM.  When he spoke with her in the afternoon she was confused and he advised her to come into the hospital to get checked out.  Patient reports she knows she was confused.  She reports she would think 1 thing but then thinks something else without finishing the first thought.  This is how she describes it, but she cannot come up with the actual word confused.  This is one of the way she shows she suddenly still confused during the interview.  She also reports "when she got back home "but is not able to say where she was.  It seems like she was talking about when she got home from the hospital but clearly she is still here.  She reports she was not have any trouble swallowing, walking, seeing, numbness.  The ED provider also did not note any asymmetric physical exam findings.  Patient reports she feels fine now and she felt fine before the confusion.  She does realize during the exam when she still is subtly confused.  She says "I think I am getting confused again."  She then says "let me get this straight."  When she stops and thinks about it, she is able to get her fax drink.  Patient has no other complaints at this time.  Patient does not smoke and does not drink. Review of Systems: As mentioned in the history of present illness. All other systems reviewed and are negative. Past Medical  History:  Diagnosis Date   Abnormal weight loss    Acute sinusitis, unspecified    Allergy    bananas, codeine; seasonal allergies-tree pollen   Anemia, unspecified    Cholelithiasis 03/03/2014   -status post cholecystectomy   Chronic kidney disease, stage 2 (mild)    Disappearance and death of family member    Dizziness and giddiness    Essential (primary) hypertension    HTN (hypertension), benign 03/03/2014   Hyperkalemia    Hypertension    Hypertensive chronic kidney disease with stage 1 through stage 4 chronic kidney disease, or unspecified chronic kidney disease    Pneumonia, unspecified organism    Tinnitus, right ear    Vertigo    Past Surgical History:  Procedure Laterality Date   CHOLECYSTECTOMY  03/04/2014   Procedure: LAPAROSCOPIC CHOLECYSTECTOMY;  Surgeon: Romie Levee, MD;  Location: [facility];  Service: General;;   NO PAST SURGERIES     Social History:  reports that she quit smoking about 11 years ago. Her smoking use included cigarettes. She has never used smokeless tobacco. She reports current alcohol use. She reports that she does not use drugs.  Allergies  Allergen Reactions   Banana     Stomach cramping and pain   Codeine Nausea And Vomiting    Family History  Problem Relation Age of Onset   Hypertension Father  Cancer Father        lung   Heart attack Brother    Hypertension Brother     Prior to Admission medications   Medication Sig Start Date End Date Taking? Authorizing Provider  cholecalciferol (VITAMIN D3) 25 MCG (1000 UNIT) tablet Take 1,000 Units by mouth daily.   Yes [provider]  losartan (COZAAR) 50 MG tablet Take 1 tablet (50 mg total) by mouth daily. 04/28/22  Yes Cook, Jayce G, DO  Omega-3 Fatty Acids (FISH OIL) 1000 MG CAPS Take by mouth daily.   Yes [provider]    Physical Exam: Vitals:   12/23/22 0116 12/23/22 0200 12/23/22 0430 12/23/22 0500  BP:  (!) 193/78 (!) 176/74 (!) 181/71  Pulse:  74 (!) 59 (!)  59  Resp:  18 15 15   Temp: 97.7 F (36.5 C)     TempSrc: Oral     SpO2:  96% 95% 96%  Weight:      Height:       1.  General: Patient lying supine in bed,  no acute distress   2. Psychiatric: Alert and oriented x 3, mood and behavior normal for situation, pleasant and cooperative with exam   3. Neurologic: Speech and language are normal, face is symmetric, moves all 4 extremities voluntarily, subtly confused but at baseline otherwise   4. HEENMT:  Head is atraumatic, normocephalic, pupils reactive to light, neck is supple, trachea is midline, mucous membranes are moist   5. Respiratory : Lungs are clear to auscultation bilaterally without wheezing, rhonchi, rales, no cyanosis, no increase in work of breathing or accessory muscle use   6. Cardiovascular : Heart rate normal, rhythm is regular, no murmurs, rubs or gallops, no peripheral edema, peripheral pulses palpated   7. Gastrointestinal:  Abdomen is soft, nondistended, nontender to palpation bowel sounds active, no masses or organomegaly palpated   8. Skin:  Skin is warm, dry and intact without rashes, acute lesions, or ulcers on limited exam   9.Musculoskeletal:  No acute deformities or trauma, no asymmetry in tone, no peripheral edema, peripheral pulses palpated, no tenderness to palpation in the extremities  Data Reviewed: In the ED Patient is hypertensive up to 223/90 satting at 97% No leukocytosis Chemistry is unremarkable UA is not indicative UTI UDS is negative CTA shows severe stenosis of the vertebral arteries but no large vessel occlusions EKG shows a heart rate 93, sinus rhythm, QTc 442 Admission requested for TIA  Assessment and Plan: * TIA (transient ischemic attack) -Intermittent confusion -Back to baseline at presentation -Last known well 11am -Permissive HTN -CTA head is negative for large vessel occlusion -Stenosis about the origins of the vertebral arteries BL.  -MRI in the AM -Lipid panel  in the AM -Hgb A1C in the AM -Neuro checks -PT/OT/SLP eval and treat -Monitor on tele  Acute metabolic encephalopathy -Confusion -CTA head without acute findings -MRI in the AM -Monitor on tele  Mixed hyperlipidemia -Lipid panel in the AM   HTN (hypertension) -Hold ARB -Permissive HTN      Advance Care Planning:   Code Status: Full Code  Consults: Not at this time  Family Communication: No family at bedside  Severity of Illness: The appropriate patient status for this patient is OBSERVATION. Observation status is judged to be reasonable and necessary in order to provide the required intensity of service to ensure the patient's safety. The patient's presenting symptoms, physical exam findings, and initial radiographic and laboratory data in the context  of their medical condition is felt to place them at decreased risk for further clinical deterioration. Furthermore, it is anticipated that the patient will be medically stable for discharge from the hospital within 2 midnights of admission.   Author: Lilyan Gilford, DO 12/23/2022 5:34 AM  For on call review [email or web address].

## 2022-12-23 NOTE — Assessment & Plan Note (Signed)
-  Hold ARB -Permissive HTN

## 2022-12-23 NOTE — Progress Notes (Signed)
PROGRESS NOTE    Patient: Joanne White                            PCP: Tommie Sams, DO                    DOB: [birth year]            DOA: 12/22/2022 KVQ:[id number]             DOS: 12/23/2022, 9:56 AM   LOS: 0 days   Date of Service: The patient was seen and examined on 12/23/2022  Subjective:   The patient was seen and examined this morning. Hemodynamically stable. Mild difficulty with word finding otherwise no new focal neurological findings No issues overnight .  Brief Narrative:    Joanne White is a 82 y.o. female with medical history significant of hypertension, CKD, and more presents to the ED with a chief complaint of confusion.   Patient reports that she was feeling confused sometimes this afternoon.  She thinks her last known well was just after lunch.  Brother had last known her to be well at 34 AM.  When he spoke with her in the afternoon she was confused and he advised her to come into the hospital to get checked out.  Patient reports she knows she was confused.  She reports she would think 1 thing but then thinks something else without finishing the first thought.  This is how she describes it, but she cannot come up with the actual word confused.  This is one of the way she shows she suddenly still confused during the interview.   She reports she was not have any trouble swallowing, walking, seeing, numbness.  The ED provider also did not note any asymmetric physical exam findings.   Patient does not smoke and does not drink.  ED Patient is hypertensive up to 223/90 satting at 97% No leukocytosis Chemistry is unremarkable UA is not indicative UTI UDS is negative CTA shows severe stenosis of the vertebral arteries but no large vessel occlusions EKG shows a heart rate 93, sinus rhythm, QTc 442 Admission requested for TIA    Assessment & Plan:   Principal Problem:   TIA (transient ischemic attack) Active Problems:   HTN (hypertension)   Mixed  hyperlipidemia   Acute metabolic encephalopathy     Assessment and Plan: * TIA (transient ischemic attack) -Intermittent confusion-mild difficulty with word findings otherwise known focal neurological findings -Ruling out stroke-pending MRI -Back to baseline at presentation  -Permissive HTN -CTA head is negative for large vessel occlusion -Stenosis about the origins of the vertebral arteries BL.  -MRI Brain:  -Lipid: LDL: 121 -Hgb A1C:  -Neuro checks -PT/OT/SLP eval and treat-passed swallow evaluation -Monitor on tele  -Initiating aspirin/Plavix/high-dose statins   Acute metabolic encephalopathy -Confusion-improving at baseline -CTA head without acute findings -MRI:   Mixed hyperlipidemia Total cholesterol 187, LDL 112, HDL 61    HTN (hypertension) -Hold ARB -Permissive HTN     ----------------------------------------------------------------------------------------------------------------------------  DVT prophylaxis:  heparin injection 5,000 Units Start: 12/23/22 0145 SCD's Start: 12/23/22 0143   Code Status:   Code Status: Full Code  Family Communication: Son present at bedside updated -Advance care planning has been discussed.   Admission status:   Status is: Observation The patient remains OBS appropriate and will d/c before 2 midnights.   Disposition: From  - home  Planning for discharge in 1-2 days: to   Procedures:   No admission procedures for hospital encounter.   Antimicrobials:  Anti-infectives (From admission, onward)    None        Medication:   [START ON 12/24/2022]  stroke: early stages of recovery book   Does not apply Once   heparin  5,000 Units Subcutaneous Q8H    acetaminophen **OR** acetaminophen (TYLENOL) oral liquid 160 mg/5 mL **OR** acetaminophen, senna-docusate   Objective:   Vitals:   12/23/22 0430 12/23/22 0500 12/23/22 0549 12/23/22 0615  BP: (!) 176/74 (!) 181/71  (!) 161/77  Pulse: (!) 59  (!) 59  62  Resp: 15 15  14   Temp:   97.9 F (36.6 C)   TempSrc:   Oral   SpO2: 95% 96%  96%  Weight:      Height:        Intake/Output Summary (Last 24 hours) at 12/23/2022 0956 Last data filed at 12/23/2022 0800 Gross per 24 hour  Intake 221.23 ml  Output --  Net 221.23 ml   Filed Weights   12/22/22 1902  Weight: 74.8 kg     Physical examination:   Constitution:  Alert, cooperative, no distress,  Appears calm and comfortable  Psychiatric:   Normal and stable mood and affect, cognition intact,   HEENT:        Normocephalic, PERRL, otherwise with in Normal limits  Chest:         Chest symmetric Cardio vascular:  S1/S2, RRR, No murmure, No Rubs or Gallops  pulmonary: Clear to auscultation bilaterally, respirations unlabored, negative wheezes / crackles Abdomen: Soft, non-tender, non-distended, bowel sounds,no masses, no organomegaly Muscular skeletal: Limited exam - in bed, able to move all 4 extremities,   Neuro:, Speech within normal limits, mild difficulty with word findings  CNII-XII intact. , normal motor and sensation, reflexes intact  Extremities: No pitting edema lower extremities, +2 pulses  Skin: Dry, warm to touch, negative for any Rashes, No open wounds Wounds: per nursing documentation   ------------------------------------------------------------------------------------------------------------------------------------------    LABs:     Latest Ref Rng & Units 12/23/2022    4:52 AM 12/22/2022    7:34 PM 03/06/2022   11:36 AM  CBC  WBC 4.0 - 10.5 K/uL 7.6  9.0  9.2   Hemoglobin 12.0 - 15.0 g/dL 46.9  62.9  52.8   Hematocrit 36.0 - 46.0 % 45.7  47.1  49.5   Platelets 150 - 400 K/uL 152  242  269       Latest Ref Rng & Units 12/23/2022    4:52 AM 12/22/2022    7:34 PM 08/07/2022    8:45 AM  CMP  Glucose 70 - 99 mg/dL 97  413  96   BUN 8 - 23 mg/dL 14  16  20    Creatinine 0.44 - 1.00 mg/dL 2.44  0.10  2.72   Sodium 135 - 145 mmol/L 136  138  138    Potassium 3.5 - 5.1 mmol/L 3.6  3.5  4.5   Chloride 98 - 111 mmol/L 108  105  103   CO2 22 - 32 mmol/L 21  25  22    Calcium 8.9 - 10.3 mg/dL 8.6  9.1  9.5   Total Protein 6.5 - 8.1 g/dL 6.2  7.0    Total Bilirubin <1.2 mg/dL 0.8  0.6    Alkaline Phos 38 - 126 U/L 56  64    AST 15 - 41 U/L 17  21    ALT 0 - 44 U/L 22  24         Micro Results No results found for this or any previous visit (from the past 240 hour(s)).  Radiology Reports DG CHEST PORT 1 VIEW  Result Date: 12/23/2022 CLINICAL DATA:  MRI screening. EXAM: PORTABLE CHEST 1 VIEW COMPARISON:  None Available. FINDINGS: The heart is enlarged and mediastinal contours are within normal limits. There is mild atherosclerotic calcification of the aorta. No consolidation, effusion, or pneumothorax. There is a fracture of the T3 rib on the left, indeterminate in age. Overlying metallic snaps and leads are noted. No radiopaque foreign body is seen. IMPRESSION: 1. No radiopaque foreign body. 2. No acute cardiopulmonary process. 3. Fracture of the T3 rib on the left, indeterminate in age. 4. Cardiomegaly. Electronically Signed   By: Thornell Sartorius M.D.   On: 12/23/2022 04:07   CT ANGIO HEAD NECK W WO CM  Result Date: 12/23/2022 CLINICAL DATA:  Initial evaluation for neuro deficit, stroke suspected. EXAM: CT ANGIOGRAPHY HEAD AND NECK WITH AND WITHOUT CONTRAST TECHNIQUE: Multidetector CT imaging of the head and neck was performed using the standard protocol during bolus administration of intravenous contrast. Multiplanar CT image reconstructions and MIPs were obtained to evaluate the vascular anatomy. Carotid stenosis measurements (when applicable) are obtained utilizing NASCET criteria, using the distal internal carotid diameter as the denominator. RADIATION DOSE REDUCTION: This exam was performed according to the departmental dose-optimization program which includes automated exposure control, adjustment of the mA and/or kV according to patient  size and/or use of iterative reconstruction technique. CONTRAST:  75mL OMNIPAQUE IOHEXOL 350 MG/ML SOLN COMPARISON:  None Available. FINDINGS: CT HEAD FINDINGS Brain: Generalized age-related cerebral atrophy. Patchy and confluent hypodensity involving the supratentorial cerebral white matter, consistent with chronic small vessel ischemic disease, moderately advanced. No acute intracranial hemorrhage. No acute large vessel territory infarct. No mass lesion or midline shift. No hydrocephalus or extra-axial fluid collection. Vascular: No abnormal hyperdense vessel. Calcified atherosclerosis present at the skull base. Skull: Scalp soft tissues within normal limits.  Calvarium intact. Sinuses/Orbits: Globes and orbital soft tissues within normal limits. Paranasal sinuses are clear. No mastoid effusion. Other: None. Review of the MIP images confirms the above findings CTA NECK FINDINGS Aortic arch: Visualized arch within normal limits for caliber with standard branch pattern. Aortic atherosclerosis. No high-grade stenosis about the origin the great vessels. Right carotid system: Right common and internal carotid arteries are tortuous but patent without dissection. Moderate atheromatous change about the right carotid bulb without hemodynamically significant greater than 50% stenosis. Superimposed penetrating plaque noted at the proximal cervical right ICA (series 10, image 72). Left carotid system: Left common and internal carotid arteries are tortuous but patent without dissection. Mild atheromatous change about the left carotid bulb without hemodynamically significant greater than 50% stenosis. Vertebral arteries: Both vertebral arteries arise from subclavian arteries. Atheromatous change with up to 50% stenosis present at the origin of the right subclavian artery. Atheromatous change about the origins of the vertebral arteries bilaterally with severe stenoses. Right vertebral artery dominant and otherwise patent within  the neck. Left vertebral artery mildly diminutive and becomes increasingly attenuated as it courses cephalad, likely due to the proximal stenosis. Additional short-segment moderate left V2 stenosis at the level of C3. No dissection. Skeleton: No discrete or worrisome osseous lesions. Moderate spondylosis present at C5-6. Other neck: No other acute finding. Upper chest: No other acute finding. Review of the MIP images confirms the above findings  CTA HEAD FINDINGS Anterior circulation: Atheromatous change seen about the carotid siphons with associated up to mild narrowing bilaterally. Both A1 segments patent. Normal anterior communicating artery complex. Anterior cerebral arteries patent without stenosis. No M1 stenosis or occlusion. Distal MCA branches perfused and symmetric. Posterior circulation: Dominant right V4 segment widely patent. Right PICA patent. Left vertebral artery is attenuated but patent to the vertebrobasilar junction. Left PICA not seen. Basilar patent without significant stenosis. Superior cerebral arteries patent bilaterally. Right PCA primarily supplied via the basilar. Left PCA supplied via a hypoplastic left P1 segment and robust left posterior communicating artery. Both PCAs patent to their distal aspects without significant stenosis. Venous sinuses: Patent allowing for timing the contrast bolus. Anatomic variants: As above.  No aneurysm. Review of the MIP images confirms the above findings IMPRESSION: CT HEAD: 1. No acute intracranial abnormality. 2. Age-related cerebral atrophy with moderate chronic small vessel ischemic disease. CTA HEAD AND NECK: 1. Negative CTA for large vessel occlusion or other emergent finding. 2. Severe stenoses about the origins of the vertebral arteries bilaterally. Right vertebral artery dominant. Additional short-segment moderate left V2 stenosis at the level of C3. 3. Moderate atheromatous change about the right carotid bulb without hemodynamically significant  greater than 50% stenosis. Superimposed penetrating plaque at the proximal cervical right ICA. 4. Atheromatous change about the carotid siphons with associated mild multifocal narrowing. 5.  Aortic Atherosclerosis (ICD10-I70.0). Electronically Signed   By: Rise Mu M.D.   On: 12/23/2022 00:00    SIGNED: Kendell Bane, MD, FHM. FAAFP. [facility] - [facility] Time spent - 35 min.  In seeing, evaluating and examining the patient. Reviewing medical records, labs, drawn plan of care. [facility],  Pager (please use [email or web address] to page/ text) Please use Epic Secure Chat for non-urgent communication (7AM-7PM)  If 7PM-7AM, please contact night-coverage [email or web address], 12/23/2022, 9:56 AM

## 2022-12-23 NOTE — ED Provider Notes (Signed)
I was called to the patient's bedside by Dr. Flossie Dibble with the hospitalist service, on the third floor to assist with a stat lumbar puncture as the patient had altered mental status and a tick bite and needed a lumbar puncture to rule out a cerebrospinal fluid infection.  Consent was obtained in written form, please see procedure note below.  .Lumbar Puncture  Date/Time: 12/23/2022 5:06 PM  Performed by: Eber Hong, MD Authorized by: Eber Hong, MD   Consent:    Consent obtained:  Written   Consent given by:  Patient   Risks, benefits, and alternatives were discussed: yes     Risks discussed:  Bleeding, headache, infection, nerve damage, repeat procedure and pain   Alternatives discussed:  No treatment Universal protocol:    Procedure explained and questions answered to patient or proxy's satisfaction: yes     Site/side marked: yes     Patient identity confirmed:  Verbally with patient and arm band Pre-procedure details:    Procedure purpose:  Diagnostic   Preparation: Patient was prepped and draped in usual sterile fashion   Sedation:    Sedation type:  None Anesthesia:    Anesthesia method:  Local infiltration   Local anesthetic:  Lidocaine 1% WITH epi Procedure details:    Lumbar space:  L3-L4 interspace   Patient position:  L lateral decubitus   Needle gauge:  22   Needle type:  Spinal needle - Quincke tip   Needle length (in):  3.5   Ultrasound guidance: no     Number of attempts:  1   Fluid appearance:  Blood-tinged and clear   Tubes of fluid:  4   Total volume (ml):  5 Post-procedure details:    Puncture site:  Adhesive bandage applied   Procedure completion:  Tolerated well, no immediate complications Comments:           Eber Hong, MD 12/23/22 1708

## 2022-12-23 NOTE — Progress Notes (Signed)
   12/23/22 1757  Assess: MEWS Score  Temp 97.7 F (36.5 C)  BP (!) 146/67  MAP (mmHg) 91  Pulse Rate (!) 57  Resp 18  SpO2 98 %  O2 Device Room Air  Assess: MEWS Score  MEWS Temp 0  MEWS Systolic 0  MEWS Pulse 0  MEWS RR 0  MEWS LOC 0  MEWS Score 0  MEWS Score Color Green  Assess: if the MEWS score is Yellow or Red  Were vital signs accurate and taken at a resting state? Yes  Assess: SIRS CRITERIA  SIRS Temperature  0  SIRS Pulse 0  SIRS Respirations  0  SIRS WBC 0  SIRS Score Sum  0

## 2022-12-24 DIAGNOSIS — R404 Transient alteration of awareness: Secondary | ICD-10-CM | POA: Diagnosis not present

## 2022-12-24 DIAGNOSIS — W57XXXD Bitten or stung by nonvenomous insect and other nonvenomous arthropods, subsequent encounter: Secondary | ICD-10-CM | POA: Diagnosis not present

## 2022-12-24 DIAGNOSIS — G459 Transient cerebral ischemic attack, unspecified: Secondary | ICD-10-CM | POA: Diagnosis not present

## 2022-12-24 DIAGNOSIS — S30861D Insect bite (nonvenomous) of abdominal wall, subsequent encounter: Secondary | ICD-10-CM | POA: Diagnosis not present

## 2022-12-24 MED ORDER — HEPARIN SODIUM (PORCINE) 5000 UNIT/ML IJ SOLN
5000.0000 [IU] | Freq: Three times a day (TID) | INTRAMUSCULAR | Status: DC
  Administered 2022-12-25: 5000 [IU] via SUBCUTANEOUS
  Filled 2022-12-24: qty 1

## 2022-12-24 MED ORDER — HALOPERIDOL LACTATE 5 MG/ML IJ SOLN
5.0000 mg | Freq: Once | INTRAMUSCULAR | Status: AC
  Administered 2022-12-24: 5 mg via INTRAVENOUS
  Filled 2022-12-24: qty 1

## 2022-12-24 MED ORDER — TRAZODONE HCL 50 MG PO TABS
50.0000 mg | ORAL_TABLET | Freq: Every day | ORAL | Status: DC
  Administered 2022-12-24: 50 mg via ORAL
  Filled 2022-12-24: qty 1

## 2022-12-24 MED ORDER — DOXYCYCLINE HYCLATE 100 MG PO TABS
100.0000 mg | ORAL_TABLET | Freq: Two times a day (BID) | ORAL | Status: DC
  Administered 2022-12-24 (×2): 100 mg via ORAL
  Filled 2022-12-24 (×2): qty 1

## 2022-12-24 MED ORDER — HALOPERIDOL LACTATE 5 MG/ML IJ SOLN: 5.0000 mg | Freq: Four times a day (QID) | INTRAMUSCULAR | Status: DC | PRN

## 2022-12-24 MED ORDER — ATORVASTATIN CALCIUM 20 MG PO TABS
20.0000 mg | ORAL_TABLET | Freq: Every day | ORAL | Status: DC
  Administered 2022-12-25: 20 mg via ORAL
  Filled 2022-12-24: qty 1

## 2022-12-24 NOTE — Plan of Care (Signed)
  Problem: Coping: Goal: Will verbalize positive feelings about self Outcome: Not Progressing Goal: Will identify appropriate support needs Outcome: Not Progressing   Problem: Activity: Goal: Risk for activity intolerance will decrease Outcome: Not Progressing   Problem: Coping: Goal: Level of anxiety will decrease Outcome: Not Progressing

## 2022-12-24 NOTE — Progress Notes (Signed)
PROGRESS NOTE    Patient: Joanne White                            PCP: Tommie Sams, DO                    DOB: May 02, 1940            DOA: 12/22/2022 ZOX:[id number]             DOS: 12/24/2022, 11:59 AM   LOS: 0 days   Date of Service: The patient was seen and examined on 12/24/2022  Subjective:   The patient was seen and examined this morning. Hemodynamically stable. No issues overnight .  Brief Narrative:    Joanne White is a 82 y.o. female with medical history significant of hypertension, CKD, and more presents to the ED with a chief complaint of confusion.   Patient reports that she was feeling confused sometimes this afternoon.  She thinks her last known well was just after lunch.  Brother had last known her to be well at 20 AM.  When he spoke with her in the afternoon she was confused and he advised her to come into the hospital to get checked out.  Patient reports she knows she was confused.  She reports she would think 1 thing but then thinks something else without finishing the first thought.  This is how she describes it, but she cannot come up with the actual word confused.  This is one of the way she shows she suddenly still confused during the interview.   She reports she was not have any trouble swallowing, walking, seeing, numbness.  The ED provider also did not note any asymmetric physical exam findings.   Patient does not smoke and does not drink.  ED Patient is hypertensive up to 223/90 satting at 97% No leukocytosis Chemistry is unremarkable UA is not indicative UTI UDS is negative CTA shows severe stenosis of the vertebral arteries but no large vessel occlusions EKG shows a heart rate 93, sinus rhythm, QTc 442 Admission requested for TIA    Assessment & Plan:   Principal Problem:   TIA (transient ischemic attack) Active Problems:   HTN (hypertension)   Mixed hyperlipidemia   Acute metabolic encephalopathy   Tick bite     Assessment and  Plan: * TIA (transient ischemic attack) -Intermittent confusion -Back to baseline at presentation -TIA/stroke ruled out -MRI of the brain negative  -CTA head is negative for large vessel occlusion -Stenosis about the origins of the vertebral arteries BL.  -MRI in the AM -Lipid: LDL 112, HDL 61 -Hgb A1C: 5.3, -Neuro checks-transient confusion -PT/OT/SLP eval and treat -passed swallow evaluation, -Monitor on tele  Tick bite Tick bite- Tick was found/identified on the lateral side of abdomen, was removed by nursing staff and while (very small but 2-3 mm in size, black multiple appendages)  -Concern for tick bite disease including Lyme and [place] disease  - ID: Dr. Drue Second was curb sided for consult recommended LP and initiation of doxycycline and Rocephin for now  -Dr. Eber Hong performed lumbar puncture on 12/23/2022  CSF: Clear, glucose 57, RBC 93, WBC 9, lymphocyte 12, monocytes 2, total protein 55 elevated,    Acute metabolic encephalopathy -Intermittent currently at baseline following command, alert oriented x 4 -During the night patient had some confusion-likely sundowning/delirium -Stroke/TIA workup negative -No signs of sepsis -Ruling out tickborne infection  Mixed hyperlipidemia Lipid Panel     Component Value Date/Time   CHOL 187 12/23/2022 0452   CHOL 216 (H) 03/06/2022 1136   TRIG 71 12/23/2022 0452   HDL 61 12/23/2022 0452   HDL 73 03/06/2022 1136   CHOLHDL 3.1 12/23/2022 0452   VLDL 14 12/23/2022 0452   LDLCALC 112 (H) 12/23/2022 0452   LDLCALC 117 (H) 03/06/2022 1136   LABVLDL 26 03/06/2022 1136  -Initiating Lipitor, reducing dose   HTN (hypertension) -Resume home medication of losartan, as needed hydralazine   ----------------------------------------------------------------------------------------------------------------------------------------------- Nutritional status:  The patient's BMI is: Body mass index is 29.23 kg/m. I agree with  the assessment and plan as outlined -------------------------------------------------------------------------------------------------------------------------------- Cultures; Serum/CSF cultures, cytology, checking for Lyme and [facility] spotted disease titers  Antibiotics : As of 12/23/2022 doxycycline/IV Rocephin  ----------------------------------------------------------------------------------------------------------------------------------  DVT prophylaxis:  heparin injection 5,000 Units Start: 12/25/22 0600 SCD's Start: 12/23/22 0143   Code Status:   Code Status: Full Code  Family Communication: Discussed with patient's son at bedside-updated -Advance care planning has been discussed.   Admission status:   Status is: Observation The patient remains OBS appropriate and will d/c before 2 midnights.   Disposition: From  - home             Planning for discharge in 1-2 days: to   Procedures:   No admission procedures for hospital encounter.   Antimicrobials:  Anti-infectives (From admission, onward)    Start     Dose/Rate Route Frequency Ordered Stop   12/24/22 1000  doxycycline (VIBRA-TABS) tablet 100 mg        100 mg Oral Every 12 hours 12/24/22 0731     12/23/22 1800  cefTRIAXone (ROCEPHIN) 1 g in sodium chloride 0.9 % 100 mL IVPB        1 g 200 mL/hr over 30 Minutes Intravenous Every 24 hours 12/23/22 1648     12/23/22 1700  doxycycline (VIBRA-TABS) tablet 100 mg  Status:  Discontinued        100 mg Oral Every 12 hours 12/23/22 1610 12/23/22 1612   12/23/22 1700  doxycycline (VIBRAMYCIN) 200 mg in dextrose 5 % 250 mL IVPB  Status:  Discontinued        200 mg 125 mL/hr over 120 Minutes Intravenous Every 12 hours 12/23/22 1613 12/24/22 0731        Medication:    stroke: early stages of recovery book   Does not apply Once   aspirin EC  81 mg Oral Daily   atorvastatin  80 mg Oral Daily   doxycycline  100 mg Oral Q12H   famotidine  20 mg Oral Daily   [START ON  12/25/2022] heparin  5,000 Units Subcutaneous Q8H   losartan  50 mg Oral Daily    acetaminophen **OR** acetaminophen (TYLENOL) oral liquid 160 mg/5 mL **OR** acetaminophen, hydrALAZINE, senna-docusate   Objective:   Vitals:   12/23/22 2301 12/24/22 0154 12/24/22 0433 12/24/22 0523  BP: (!) 117/49 132/67 (!) 173/70 (!) 130/53  Pulse: (!) 58 (!) 57 97   Resp:  17 20   Temp:  98.2 F (36.8 C) (!) 97.5 F (36.4 C)   TempSrc:   Oral   SpO2:  98% 97%   Weight:      Height:        Intake/Output Summary (Last 24 hours) at 12/24/2022 1159 Last data filed at 12/24/2022 0830 Gross per 24 hour  Intake 710 ml  Output 250 ml  Net 460 ml  Filed Weights   12/22/22 1902  Weight: 74.8 kg     Physical examination:   Constitution:  Alert, cooperative, no distress,  Appears calm and comfortable  Psychiatric:   Normal and stable mood and affect, cognition intact,   HEENT:        Normocephalic, PERRL, otherwise with in Normal limits  Chest:         Chest symmetric Cardio vascular:  S1/S2, RRR, No murmure, No Rubs or Gallops  pulmonary: Clear to auscultation bilaterally, respirations unlabored, negative wheezes / crackles Abdomen: Soft, non-tender, non-distended, bowel sounds,no masses, no organomegaly Muscular skeletal: Limited exam - in bed, able to move all 4 extremities,   Neuro: CNII-XII intact. , normal motor and sensation, reflexes intact  Extremities: No pitting edema lower extremities, +2 pulses  Skin: Dry, warm to touch, negative for any Rashes, No open wounds Wounds: per nursing documentation   ------------------------------------------------------------------------------------------------------------------------------------------    LABs:     Latest Ref Rng & Units 12/23/2022    4:52 AM 12/22/2022    7:34 PM 03/06/2022   11:36 AM  CBC  WBC 4.0 - 10.5 K/uL 7.6  9.0  9.2   Hemoglobin 12.0 - 15.0 g/dL 21.3  08.6  57.8   Hematocrit 36.0 - 46.0 % 45.7  47.1  49.5    Platelets 150 - 400 K/uL 152  242  269       Latest Ref Rng & Units 12/23/2022    4:52 AM 12/22/2022    7:34 PM 08/07/2022    8:45 AM  CMP  Glucose 70 - 99 mg/dL 97  469  96   BUN 8 - 23 mg/dL 14  16  20    Creatinine 0.44 - 1.00 mg/dL 6.29  5.28  4.13   Sodium 135 - 145 mmol/L 136  138  138   Potassium 3.5 - 5.1 mmol/L 3.6  3.5  4.5   Chloride 98 - 111 mmol/L 108  105  103   CO2 22 - 32 mmol/L 21  25  22    Calcium 8.9 - 10.3 mg/dL 8.6  9.1  9.5   Total Protein 6.5 - 8.1 g/dL 6.2  7.0    Total Bilirubin <1.2 mg/dL 0.8  0.6    Alkaline Phos 38 - 126 U/L 56  64    AST 15 - 41 U/L 17  21    ALT 0 - 44 U/L 22  24         Micro Results Recent Results (from the past 240 hour(s))  CSF culture w Gram Stain     Status: None (Preliminary result)   Collection Time: 12/23/22  5:00 PM   Specimen: Lumbar Puncture; Cerebrospinal Fluid  Result Value Ref Range Status   Specimen Description   Final    LP Performed at [facility], [address], [place], [place] [address]    Special Requests   Final    NONE Performed at [facility], [address] [place], [place] [address]    Gram Stain   Final    NO ORGANISMS SEEN Performed at [facility] WBC PRESENT,BOTH PMN AND MONONUCLEAR Performed at [facility], [address] [place], [place] [address]    Culture   Final    NO GROWTH < 24 HOURS Performed at [facility], [address], [place], [place] [address]    Report Status PENDING  Incomplete    Radiology Reports No results found.  SIGNED: Kendell Bane, MD, FHM.  FAAFP. [facility] - [facility] Time spent - 55 min.  In seeing, evaluating and examining the patient. Reviewing medical records, labs, drawn plan of care. [facility],  Pager (please use [email or web address] to page/ text) Please use Epic Secure Chat for non-urgent communication (7AM-7PM)  If 7PM-7AM, please contact night-coverage [email or web address], 12/24/2022, 11:59 AM
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
## 2022-12-24 NOTE — TOC Progression Note (Signed)
Transition of Care [facility]) - Progression Note    Patient Details  Name: Joanne White MRN: [id number] Date of Birth: May 09, 1940  Transition of [facility]) CM/SW Contact  Catalina Gravel, LCSW Phone Number: 12/24/2022, 3:45 PM  Clinical Narrative:    CSW visited pt at bedside to deliver obs. Pt understood and signed. Placed copy with desk for DC packet.      Barriers to Discharge: Continued Medical Work up  Expected Discharge Plan and Services                                               Social Determinants of Health (SDOH) Interventions SDOH Screenings   Food Insecurity: No Food Insecurity (12/23/2022)  Housing: Low Risk  (12/23/2022)  Transportation Needs: No Transportation Needs (12/23/2022)  Utilities: Not At Risk (12/23/2022)  Alcohol Screen: Low Risk  (05/05/2022)  Depression (PHQ2-9): Low Risk  (08/14/2022)  Financial Resource Strain: Low Risk  (05/05/2022)  Physical Activity: Insufficiently Active (05/05/2022)  Social Connections: Moderately Isolated (05/05/2022)  Stress: No Stress Concern Present (05/05/2022)  Tobacco Use: Medium Risk (12/22/2022)    Readmission Risk Interventions     No data to display

## 2022-12-24 NOTE — Care Management Obs Status (Signed)
MEDICARE OBSERVATION STATUS NOTIFICATION   Patient Details  Name: Joanne White MRN: [id number] Date of Birth: November 07, 1940   Medicare Observation Status Notification Given:  Yes    Madai Nuccio Marsh Dolly, LCSW 12/24/2022, 3:39 PM

## 2022-12-25 DIAGNOSIS — R404 Transient alteration of awareness: Secondary | ICD-10-CM | POA: Diagnosis not present

## 2022-12-25 DIAGNOSIS — W57XXXD Bitten or stung by nonvenomous insect and other nonvenomous arthropods, subsequent encounter: Secondary | ICD-10-CM | POA: Diagnosis not present

## 2022-12-25 DIAGNOSIS — S30861D Insect bite (nonvenomous) of abdominal wall, subsequent encounter: Secondary | ICD-10-CM

## 2022-12-25 MED ORDER — ASPIRIN 81 MG PO TBEC: 81.0000 mg | DELAYED_RELEASE_TABLET | Freq: Every day | ORAL | 12 refills | Status: DC

## 2022-12-25 MED ORDER — DOXYCYCLINE HYCLATE 100 MG PO TABS: 100.0000 mg | ORAL_TABLET | Freq: Two times a day (BID) | ORAL | 0 refills | Status: AC

## 2022-12-25 MED ORDER — DOXYCYCLINE HYCLATE 100 MG IV SOLR
100.0000 mg | Freq: Two times a day (BID) | INTRAVENOUS | Status: DC
  Administered 2022-12-25: 100 mg via INTRAVENOUS
  Filled 2022-12-25 (×3): qty 100

## 2022-12-25 MED ORDER — ATORVASTATIN CALCIUM 20 MG PO TABS: 20.0000 mg | ORAL_TABLET | Freq: Every day | ORAL | 1 refills | Status: DC

## 2022-12-25 NOTE — Evaluation (Signed)
Occupational Therapy Evaluation Patient Details Name: Joanne White MRN: [id number] DOB: [birth year] Today's Date: 12/25/2022   History of Present Illness Joanne White is a 82 y.o. female with medical history significant of hypertension, CKD, and more presents to the ED with a chief complaint of confusion.  Patient reports that she was feeling confused sometimes this afternoon.  She thinks her last known well was just after lunch.  Brother had last known her to be well at 78 AM.  When he spoke with her in the afternoon she was confused and he advised her to come into the hospital to get checked out.  Patient reports she knows she was confused.  She reports she would think 1 thing but then thinks something else without finishing the first thought.  This is how she describes it, but she cannot come up with the actual word confused.  This is one of the way she shows she suddenly still confused during the interview.  She also reports "when she got back home "but is not able to say where she was.  It seems like she was talking about when she got home from the hospital but clearly she is still here.  She reports she was not have any trouble swallowing, walking, seeing, numbness.  The ED provider also did not note any asymmetric physical exam findings.  Patient reports she feels fine now and she felt fine before the confusion.  She does realize during the exam when she still is subtly confused.  She says "I think I am getting confused again."  She then says "let me get this straight."  When she stops and thinks about it, she is able to get her fax drink.  Patient has no other complaints at this time.   Clinical Impression   Pt agreeable to OT evaluation. Pt is independent at baseline. Close to baseline today with mild unsteadiness likely due to not getting out of bed recently. Independent with bed mobility and seated ADL's. Pt was left in the chair with family present. Pt is not recommended for further acute  OT services and will be discharged to care of nursing staff for remaining length of stay.           If plan is discharge home, recommend the following: Assist for transportation    Functional Status Assessment  Patient has not had a recent decline in their functional status  Equipment Recommendations  None recommended by OT           Precautions / Restrictions Precautions Precautions: Fall Restrictions Weight Bearing Restrictions: No      Mobility Bed Mobility Overal bed mobility: Independent             General bed mobility comments: HOB slightly elevated to simulate home.    Transfers Overall transfer level: Modified independent                 General transfer comment: Mildly unsteady mod I to supervisioin for ambualtion to toilet.      Balance Overall balance assessment: Mild deficits observed, not formally tested                                         ADL either performed or assessed with clinical judgement   ADL Overall ADL's : Modified independent (Mildly unsteady in standing needing mod I to supervision for standing tasks.)  Vision Baseline Vision/History: 1 Wears glasses Ability to See in Adequate Light: 0 Adequate Patient Visual Report: No change from baseline Vision Assessment?: No apparent visual deficits     Perception Perception: Not tested       Praxis Praxis: WFL       Pertinent Vitals/Pain Pain Assessment Pain Assessment: No/denies pain     Extremity/Trunk Assessment Upper Extremity Assessment Upper Extremity Assessment: Overall WFL for tasks assessed   Lower Extremity Assessment Lower Extremity Assessment: Defer to PT evaluation   Cervical / Trunk Assessment Cervical / Trunk Assessment: Normal   Communication Communication Communication: No apparent difficulties   Cognition Arousal: Alert Behavior During Therapy: WFL for tasks  assessed/performed Overall Cognitive Status: Within Functional Limits for tasks assessed                                                        Home Living Family/patient expects to be discharged to:: Private residence Living Arrangements: Alone Available Help at Discharge: Family;Available PRN/intermittently Type of Home: House Home Access: Ramped entrance;Stairs to enter Entrance Stairs-Number of Steps: 2 Entrance Stairs-Rails: Right;Left;Can reach both Home Layout: One level     Bathroom Shower/Tub: Chief Strategy Officer: Standard Bathroom Accessibility: Yes   Home Equipment: Agricultural consultant (2 wheels);Cane - single point;BSC/3in1;Shower seat - built in;Grab bars - tub/shower   Additional Comments: per PT note      Prior Functioning/Environment Prior Level of Function : Independent/Modified Independent;Driving             Mobility Comments: Tourist information centre manager, drives ADLs Comments: Independent              AM-PAC OT "6 Clicks" Daily Activity     Outcome Measure Help from another person eating meals?: None Help from another person taking care of personal grooming?: None Help from another person toileting, which includes using toliet, bedpan, or urinal?: None Help from another person bathing (including washing, rinsing, drying)?: None Help from another person to put on and taking off regular upper body clothing?: None Help from another person to put on and taking off regular lower body clothing?: None 6 Click Score: 24   End of Session    Activity Tolerance: Patient tolerated treatment well Patient left: in chair;with call bell/phone within reach;with family/visitor present  OT Visit Diagnosis: Other symptoms and signs involving cognitive function                Time: 1610-9604 OT Time Calculation (min): 12 min Charges:  OT General Charges $OT Visit: 1 Visit OT Evaluation $OT Eval Low Complexity: 1 Low  Elizar Alpern OT, MOT   Danie Chandler 12/25/2022, 9:07 AM

## 2022-12-25 NOTE — Discharge Summary (Signed)
Physician Discharge Summary   Patient: Joanne White MRN: [id number] DOB: [birth year]  Admit date:     12/22/2022  Discharge date: 12/25/22  Discharge Physician: Kendell Bane   PCP: Tommie Sams, DO   Recommendations at discharge:    Follow-up with PCP in 1 week Reviewed serum and CSF fluid for tick pathogen serology (including Lyme and [facility]. Spot disease) If positive may be referred to infectious disease team (Dr. Drue Second in [place]) Continue aspirin and statin to reduce risk of TIA or future strokes  Discharge Diagnoses: Principal Problem:   Tick bite Active Problems:   TIA (transient ischemic attack)   HTN (hypertension)   Mixed hyperlipidemia   Acute metabolic encephalopathy  Resolved Problems:   * No resolved hospital problems. *  Hospital Course:  Joanne White is a 82 y.o. female with medical history significant of hypertension, CKD, and more presents to the ED with a chief complaint of confusion.   Patient reports that she was feeling confused sometimes this afternoon.  She thinks her last known well was just after lunch.  Brother had last known her to be well at 105 AM.  When he spoke with her in the afternoon she was confused and he advised her to come into the hospital to get checked out.  Patient reports she knows she was confused.  She reports she would think 1 thing but then thinks something else without finishing the first thought.  This is how she describes it, but she cannot come up with the actual word confused.  This is one of the way she shows she suddenly still confused during the interview.   She reports she was not have any trouble swallowing, walking, seeing, numbness.  The ED provider also did not note any asymmetric physical exam findings.   Patient does not smoke and does not drink.  ED Patient is hypertensive up to 223/90 satting at 97% No leukocytosis Chemistry is unremarkable UA is not indicative UTI UDS is negative CTA shows  severe stenosis of the vertebral arteries but no large vessel occlusions EKG shows a heart rate 93, sinus rhythm, QTc 442 Admission requested for TIA  Tick bite Tick bite- Tick was found/identified on the lateral side of abdomen, was removed by nursing staff and while (very small but 2-3 mm in size, black multiple appendages)   -Concern for tick bite disease including Lyme and [facility] disease   - ID: Dr. Drue Second was curb sided for consult recommended LP and initiation of doxycycline and Rocephin for now   -Dr. Eber Hong performed lumbar puncture on 12/23/2022   CSF: Clear, glucose 57, RBC 93, WBC 9, lymphocyte 12, monocytes 2, total protein 55 elevated,   Patient received IV doxycycline and Rocephin, switched to p.o. doxycycline Patient to follow-up with outpatient with serum and CSF serology for tickborne disease such as Lyme and [facility]   TIA (transient ischemic attack) -Intermittent confusion -Back to baseline at presentation -TIA/stroke ruled out -MRI of the brain negative   -CTA head is negative for large vessel occlusion -Stenosis about the origins of the vertebral arteries BL.  -MRI in the AM -Lipid: LDL 112, HDL 61 -Hgb A1C: 5.3, -Neuro checks-transient confusion   Acute metabolic encephalopathy -Transient confusion-improved -CTA head without acute findings -MRI within normal limits   Mixed hyperlipidemia --LDL 112 Small dose of statin initiated for LDL goal of less than 70 Lipid Panel     Component Value Date/Time   CHOL 187  12/23/2022 0452   CHOL 216 (H) 03/06/2022 1136   TRIG 71 12/23/2022 0452   HDL 61 12/23/2022 0452   HDL 73 03/06/2022 1136   CHOLHDL 3.1 12/23/2022 0452   VLDL 14 12/23/2022 0452   LDLCALC 112 (H) 12/23/2022 0452   LDLCALC 117 (H) 03/06/2022 1136   LABVLDL 26 03/06/2022 1136       HTN (hypertension) Resuming home medication of losartan     Consultants: Curbside ID consult ( Dr. Drue Second) Procedures performed:  Lumbar puncture Disposition: Home Diet recommendation:  Discharge Diet Orders (From admission, onward)     Start     Ordered   12/25/22 0000  Diet - low sodium heart healthy        12/25/22 1051           Cardiac diet DISCHARGE MEDICATION: Allergies as of 12/25/2022       Reactions   Banana    Stomach cramping and pain   Codeine Nausea And Vomiting        Medication List     TAKE these medications    aspirin EC 81 MG tablet Take 1 tablet (81 mg total) by mouth daily. Swallow whole. Start taking on: December 26, 2022   atorvastatin 20 MG tablet Commonly known as: LIPITOR Take 1 tablet (20 mg total) by mouth daily. Start taking on: December 26, 2022   cholecalciferol 25 MCG (1000 UNIT) tablet Commonly known as: VITAMIN D3 Take 1,000 Units by mouth daily.   doxycycline 100 MG tablet Commonly known as: VIBRA-TABS Take 1 tablet (100 mg total) by mouth 2 (two) times daily for 10 days.   Fish Oil 1000 MG Caps Take by mouth daily.   losartan 50 MG tablet Commonly known as: COZAAR Take 1 tablet (50 mg total) by mouth daily.        Discharge Exam: [name] Weights   12/22/22 1902  Weight: 74.8 kg        General:  AAO x 3,  cooperative, no distress;   HEENT:  Normocephalic, PERRL, otherwise with in Normal limits   Neuro:  CNII-XII intact. , normal motor and sensation, reflexes intact   Lungs:   Clear to auscultation BL, Respirations unlabored,  No wheezes / crackles  Cardio:    S1/S2, RRR, No murmure, No Rubs or Gallops   Abdomen:  Soft, non-tender, bowel sounds active all four quadrants, no guarding or peritoneal signs.  Muscular  skeletal:  Limited exam -global generalized weaknesses - in bed, able to move all 4 extremities,   2+ pulses,  symmetric, No pitting edema  Skin:  Dry, warm to touch, negative for any Rashes,  Wounds: Please see nursing documentation          Condition at discharge: good  The results of significant diagnostics  from this hospitalization (including imaging, microbiology, ancillary and laboratory) are listed below for reference.   Imaging Studies: ECHOCARDIOGRAM COMPLETE  Result Date: 12/23/2022    ECHOCARDIOGRAM REPORT   Patient Name:   VERLE PATTAN Date of Exam: 12/23/2022 Medical Rec #:  [id number]       Height:       63.0 in Accession #:    [id number]      Weight:       165.0 lb Date of Birth:  1940-07-01       BSA:          1.782 m Patient Age:    82 years        BP:  161/77 mmHg Patient Gender: F               HR:           50 bpm. [facility] Procedure: 2D Echo, Cardiac Doppler and Color Doppler Indications:    TIA  History:        Patient has no prior history of Echocardiogram examinations. TIA                 and CKD; Risk Factors:Former Smoker, Hypertension and                 Dyslipidemia.  Sonographer:    Dondra Prader RVT RCS Referring Phys: [id number] ASIA B ZIERLE-GHOSH  Sonographer Comments: Suboptimal subcostal window and Technically difficult study due to poor echo windows. Image acquisition challenging due to respiratory motion. Patient unable to breath hold. IMPRESSIONS  1. Left ventricular ejection fraction, by estimation, is 60 to 65%. The left ventricle has normal function. The left ventricle has no regional wall motion abnormalities. Left ventricular diastolic parameters are indeterminate. Elevated left atrial pressure. The E/e' is 17.  2. Right ventricular systolic function is normal. The right ventricular size is normal.  3. The mitral valve is normal in structure. Trivial mitral valve regurgitation. No evidence of mitral stenosis.  4. The aortic valve is tricuspid. Aortic valve regurgitation is not visualized. No aortic stenosis is present. Comparison(s): No prior Echocardiogram. Conclusion(s)/Recommendation(s): No intracardiac source of embolism detected on this transthoracic study. Consider a transesophageal echocardiogram to exclude cardiac source of embolism if  clinically indicated. FINDINGS  Left Ventricle: Left ventricular ejection fraction, by estimation, is 60 to 65%. The left ventricle has normal function. The left ventricle has no regional wall motion abnormalities. The left ventricular internal cavity size was normal in size. There is  borderline left ventricular hypertrophy. Left ventricular diastolic parameters are indeterminate. Elevated left atrial pressure. The E/e' is 29. Right Ventricle: The right ventricular size is normal. Right vetricular wall thickness was not well visualized. Right ventricular systolic function is normal. Left Atrium: Left atrial size was normal in size. Right Atrium: Right atrial size was normal in size. Pericardium: There is no evidence of pericardial effusion. Mitral Valve: The mitral valve is normal in structure. Trivial mitral valve regurgitation. No evidence of mitral valve stenosis. Tricuspid Valve: The tricuspid valve is normal in structure. Tricuspid valve regurgitation is mild . No evidence of tricuspid stenosis. Aortic Valve: The aortic valve is tricuspid. Aortic valve regurgitation is not visualized. No aortic stenosis is present. Aortic valve mean gradient measures 3.0 mmHg. Aortic valve peak gradient measures 5.2 mmHg. Aortic valve area, by VTI measures 2.79 cm. Pulmonic Valve: The pulmonic valve was normal in structure. Pulmonic valve regurgitation is mild. No evidence of pulmonic stenosis. Aorta: The aortic root and ascending aorta are structurally normal, with no evidence of dilitation. Venous: The inferior vena cava was not well visualized. IAS/Shunts: The atrial septum is grossly normal.  LEFT VENTRICLE PLAX 2D LVIDd:         3.50 cm   Diastology LVIDs:         2.50 cm   LV e' medial:    4.22 cm/s LV PW:         1.10 cm   LV E/e' medial:  17.4 LV IVS:        0.90 cm   LV e' lateral:   4.19 cm/s LVOT diam:     2.00 cm   LV E/e' lateral:  17.5 LV SV:         85 LV SV Index:   48 LVOT Area:     3.14 cm  RIGHT VENTRICLE  RV S prime:     7.56 cm/s TAPSE (M-mode): 1.7 cm LEFT ATRIUM           Index        RIGHT ATRIUM           Index LA diam:      3.30 cm 1.85 cm/m   RA Area:     10.10 cm LA Vol (A2C): 40.5 ml 22.73 ml/m  RA Volume:   21.00 ml  11.79 ml/m LA Vol (A4C): 38.0 ml 21.33 ml/m  AORTIC VALVE                    PULMONIC VALVE AV Area (Vmax):    3.17 cm     PV Vmax:          0.83 m/s AV Area (Vmean):   2.83 cm     PV Peak grad:     2.7 mmHg AV Area (VTI):     2.79 cm     PR End Diast Vel: 1.39 msec AV Vmax:           114.00 cm/s AV Vmean:          80.800 cm/s AV VTI:            0.306 m AV Peak Grad:      5.2 mmHg AV Mean Grad:      3.0 mmHg LVOT Vmax:         115.00 cm/s LVOT Vmean:        72.850 cm/s LVOT VTI:          0.272 m LVOT/AV VTI ratio: 0.89  AORTA Ao Root diam: 3.25 cm Ao Asc diam:  3.50 cm MITRAL VALVE                TRICUSPID VALVE MV Area (PHT): 2.60 cm     TR Peak grad:   24.4 mmHg MV Decel Time: 292 msec     TR Vmax:        247.00 cm/s MV E velocity: 73.30 cm/s MV A velocity: 112.00 cm/s  SHUNTS MV E/A ratio:  0.65         Systemic VTI:  0.27 m                             Systemic Diam: 2.00 cm Sunit Tolia Electronically signed by Tessa Lerner Signature Date/Time: 12/23/2022/2:36:39 PM    Final    MR BRAIN WO CONTRAST  Result Date: 12/23/2022 CLINICAL DATA:  82 year old female neurologic deficit. EXAM: MRI HEAD WITHOUT CONTRAST TECHNIQUE: Multiplanar, multiecho pulse sequences of the brain and surrounding structures were obtained without intravenous contrast. COMPARISON:  CT head, CTA head and neck yesterday. FINDINGS: Brain: Cerebral volume is within normal limits for age. No restricted diffusion to suggest acute infarction. No midline shift, mass effect, evidence of mass lesion, ventriculomegaly, extra-axial collection or acute intracranial hemorrhage. Cervicomedullary junction and pituitary are within normal limits. Patchy and confluent, widely scattered cerebral white matter T2 and FLAIR  hyperintensity bilaterally. Mild-to-moderate similar heterogeneity in the pons, bilateral deep gray nuclei. No cortical encephalomalacia identified. No chronic cerebral blood products on SWI. Cerebellum appears negative. Vascular: Major intracranial vascular flow voids preserved except for the distal left vertebral artery, concordant with abnormal  CTA findings yesterday (please see that report) Skull and upper cervical spine: Normal for age visible cervical spine. Visualized bone marrow signal is within normal limits. Sinuses/Orbits: Negative orbits. Minor paranasal sinus mucosal thickening. Other: Visible internal auditory structures appear normal. Mastoids appear clear. Normal stylomastoid foramina. Negative visible scalp and face. IMPRESSION: 1. No acute intracranial abnormality. 2. Moderate for age signal changes in the brain most commonly due to chronic small vessel disease. 3. Abnormal left vertebral artery flow void, concordant with CTA findings yesterday. Electronically Signed   By: Odessa Fleming M.D.   On: 12/23/2022 08:53   DG CHEST PORT 1 VIEW  Result Date: 12/23/2022 CLINICAL DATA:  MRI screening. EXAM: PORTABLE CHEST 1 VIEW COMPARISON:  None Available. FINDINGS: The heart is enlarged and mediastinal contours are within normal limits. There is mild atherosclerotic calcification of the aorta. No consolidation, effusion, or pneumothorax. There is a fracture of the T3 rib on the left, indeterminate in age. Overlying metallic snaps and leads are noted. No radiopaque foreign body is seen. IMPRESSION: 1. No radiopaque foreign body. 2. No acute cardiopulmonary process. 3. Fracture of the T3 rib on the left, indeterminate in age. 4. Cardiomegaly. Electronically Signed   By: Thornell Sartorius M.D.   On: 12/23/2022 04:07   CT ANGIO HEAD NECK W WO CM  Result Date: 12/23/2022 CLINICAL DATA:  Initial evaluation for neuro deficit, stroke suspected. EXAM: CT ANGIOGRAPHY HEAD AND NECK WITH AND WITHOUT CONTRAST TECHNIQUE:  Multidetector CT imaging of the head and neck was performed using the standard protocol during bolus administration of intravenous contrast. Multiplanar CT image reconstructions and MIPs were obtained to evaluate the vascular anatomy. Carotid stenosis measurements (when applicable) are obtained utilizing NASCET criteria, using the distal internal carotid diameter as the denominator. RADIATION DOSE REDUCTION: This exam was performed according to the departmental dose-optimization program which includes automated exposure control, adjustment of the mA and/or kV according to patient size and/or use of iterative reconstruction technique. CONTRAST:  75mL OMNIPAQUE IOHEXOL 350 MG/ML SOLN COMPARISON:  None Available. FINDINGS: CT HEAD FINDINGS Brain: Generalized age-related cerebral atrophy. Patchy and confluent hypodensity involving the supratentorial cerebral white matter, consistent with chronic small vessel ischemic disease, moderately advanced. No acute intracranial hemorrhage. No acute large vessel territory infarct. No mass lesion or midline shift. No hydrocephalus or extra-axial fluid collection. Vascular: No abnormal hyperdense vessel. Calcified atherosclerosis present at the skull base. Skull: Scalp soft tissues within normal limits.  Calvarium intact. Sinuses/Orbits: Globes and orbital soft tissues within normal limits. Paranasal sinuses are clear. No mastoid effusion. Other: None. Review of the MIP images confirms the above findings CTA NECK FINDINGS Aortic arch: Visualized arch within normal limits for caliber with standard branch pattern. Aortic atherosclerosis. No high-grade stenosis about the origin the great vessels. Right carotid system: Right common and internal carotid arteries are tortuous but patent without dissection. Moderate atheromatous change about the right carotid bulb without hemodynamically significant greater than 50% stenosis. Superimposed penetrating plaque noted at the proximal cervical  right ICA (series 10, image 72). Left carotid system: Left common and internal carotid arteries are tortuous but patent without dissection. Mild atheromatous change about the left carotid bulb without hemodynamically significant greater than 50% stenosis. Vertebral arteries: Both vertebral arteries arise from subclavian arteries. Atheromatous change with up to 50% stenosis present at the origin of the right subclavian artery. Atheromatous change about the origins of the vertebral arteries bilaterally with severe stenoses. Right vertebral artery dominant and otherwise patent within the neck. Left  vertebral artery mildly diminutive and becomes increasingly attenuated as it courses cephalad, likely due to the proximal stenosis. Additional short-segment moderate left V2 stenosis at the level of C3. No dissection. Skeleton: No discrete or worrisome osseous lesions. Moderate spondylosis present at C5-6. Other neck: No other acute finding. Upper chest: No other acute finding. Review of the MIP images confirms the above findings CTA HEAD FINDINGS Anterior circulation: Atheromatous change seen about the carotid siphons with associated up to mild narrowing bilaterally. Both A1 segments patent. Normal anterior communicating artery complex. Anterior cerebral arteries patent without stenosis. No M1 stenosis or occlusion. Distal MCA branches perfused and symmetric. Posterior circulation: Dominant right V4 segment widely patent. Right PICA patent. Left vertebral artery is attenuated but patent to the vertebrobasilar junction. Left PICA not seen. Basilar patent without significant stenosis. Superior cerebral arteries patent bilaterally. Right PCA primarily supplied via the basilar. Left PCA supplied via a hypoplastic left P1 segment and robust left posterior communicating artery. Both PCAs patent to their distal aspects without significant stenosis. Venous sinuses: Patent allowing for timing the contrast bolus. Anatomic variants:  As above.  No aneurysm. Review of the MIP images confirms the above findings IMPRESSION: CT HEAD: 1. No acute intracranial abnormality. 2. Age-related cerebral atrophy with moderate chronic small vessel ischemic disease. CTA HEAD AND NECK: 1. Negative CTA for large vessel occlusion or other emergent finding. 2. Severe stenoses about the origins of the vertebral arteries bilaterally. Right vertebral artery dominant. Additional short-segment moderate left V2 stenosis at the level of C3. 3. Moderate atheromatous change about the right carotid bulb without hemodynamically significant greater than 50% stenosis. Superimposed penetrating plaque at the proximal cervical right ICA. 4. Atheromatous change about the carotid siphons with associated mild multifocal narrowing. 5.  Aortic Atherosclerosis (ICD10-I70.0). Electronically Signed   By: Rise Mu M.D.   On: 12/23/2022 00:00    Microbiology: Results for orders placed or performed during the hospital encounter of 12/22/22  CSF culture w Gram Stain     Status: None (Preliminary result)   Collection Time: 12/23/22  5:00 PM   Specimen: Lumbar Puncture; Cerebrospinal Fluid  Result Value Ref Range Status   Specimen Description   Final    LP Performed at [facility], [address], [place], [place] [address]    Special Requests   Final    NONE Performed at [facility], [address] [place], [place] [address]    Gram Stain   Final    NO ORGANISMS SEEN Performed at [facility] WBC PRESENT,BOTH PMN AND MONONUCLEAR Performed at [facility], [address] [place], [place] [address]    Culture   Final    NO GROWTH 2 DAYS Performed at [facility], [address] [place], [place] [address]    Report Status PENDING  Incomplete    Labs: CBC: Recent Labs  Lab 12/22/22 1934 12/23/22 0452  WBC 9.0 7.6  NEUTROABS 6.7  --   HGB 16.5* 15.8*  HCT 47.1* 45.7  MCV 98.3 97.9  PLT 242 152   Basic Metabolic Panel: Recent Labs   Lab 12/22/22 1934 12/23/22 0452  NA 138 136  K 3.5 3.6  CL 105 108  CO2 25 21*  GLUCOSE 150* 97  BUN 16 14  CREATININE 1.17* 1.10*  CALCIUM 9.1 8.6*   Liver Function Tests: Recent Labs  Lab 12/22/22 1934 12/23/22 0452  AST 21 17  ALT 24 22  ALKPHOS 64 56  BILITOT 0.6 0.8  PROT 7.0 6.2*  ALBUMIN 4.1 3.5   CBG: Recent Labs  Lab 12/22/22 1859  GLUCAP 108*    Discharge time spent: greater than 30 minutes.  Signed: Kendell Bane, MD [facility] 12/25/2022

## 2022-12-25 NOTE — Progress Notes (Signed)
Patient discharged home today, transported home by family. Discharge summary went over with patient, patient verbalized understanding. Belongings sent home with patient.  

## 2022-12-27 ENCOUNTER — Ambulatory Visit: Payer: Medicare Other | Admitting: Family Medicine

## 2022-12-27 VITALS — BP 200/98 | HR 60 | Temp 97.5°F | Ht 63.0 in | Wt 161.0 lb

## 2022-12-27 DIAGNOSIS — I160 Hypertensive urgency: Secondary | ICD-10-CM | POA: Diagnosis not present

## 2022-12-27 DIAGNOSIS — G9341 Metabolic encephalopathy: Secondary | ICD-10-CM | POA: Diagnosis not present

## 2022-12-27 LAB — CSF CULTURE W GRAM STAIN
Culture: NO GROWTH
Gram Stain: NONE SEEN

## 2022-12-27 LAB — HSV 1/2 PCR, CSF
HSV-1 DNA: NEGATIVE
HSV-2 DNA: NEGATIVE

## 2022-12-27 LAB — MISC LABCORP TEST (SEND OUT)

## 2022-12-27 LAB — CYTOLOGY - NON PAP

## 2022-12-27 MED ORDER — AMLODIPINE BESYLATE 5 MG PO TABS: 5.0000 mg | ORAL_TABLET | Freq: Every day | ORAL | 3 refills | Status: DC

## 2022-12-27 NOTE — Assessment & Plan Note (Signed)
Now resolved.  Unclear whether this was infectious related or secondary to TIA.  Await final results from the hospital.

## 2022-12-27 NOTE — Patient Instructions (Addendum)
Continue your medications.  I have added Amlodipine.  Follow up next week.

## 2022-12-27 NOTE — Progress Notes (Signed)
Subjective:  Patient ID: Joanne White, female    DOB: Jul 06, 1940  Age: 82 y.o. MRN: [id number]  CC:  Hospital follow up   HPI:  82 year old female presents for hospital follow-up.  Patient admitted from 11/8 to 11/11.  Presented with confusion.  Was hypertensive with no evidence of leukocytosis.  No evidence of UTI.  Concern for TIA.  Tick bite with tick still in place was noted and subsequently was removed by nursing staff.  Concern that this may be tickborne illness.  Infectious disease was consulted.  LP was done.  Patient was placed on empiric doxycycline and Rocephin.  Neuroimaging revealed no evidence of stroke.  It did reveal cerebrovascular disease.  Patient presents today for follow-up.  She is improved.  She is very hypertensive today.  CSF culture negative.  Lyme serologies still pending.  Patient remains on doxycycline.  She is now on aspirin and statin as well.  Patient Active Problem List   Diagnosis Date Noted   TIA (transient ischemic attack) 12/23/2022   Acute metabolic encephalopathy 12/23/2022   Tick bite 12/23/2022   Elevated hemoglobin (HCC) 12/01/2021   Mixed hyperlipidemia 02/14/2021   Allergy to alpha-gal 02/20/2019   Hypertensive urgency    Chronic kidney disease, stage 3a (HCC)     Social Hx   Social History   Socioeconomic History   Marital status: Single    Spouse name: Not on file   Number of children: Not on file   Years of education: Not on file   Highest education level: Not on file  Occupational History   Occupation: retired  Tobacco Use   Smoking status: Former    Current packs/day: 0.00    Types: Cigarettes    Quit date: 03/03/2011    Years since quitting: 11.8   Smokeless tobacco: Never  Vaping Use   Vaping status: Never Used  Substance and Sexual Activity   Alcohol use: Yes    Comment: 1 beer once or twice a week and a rare mixed drink   Drug use: No   Sexual activity: Not Currently  Other Topics Concern   Not on file   Social History Narrative   Not on file   Social Determinants of Health   Financial Resource Strain: Low Risk  (05/05/2022)   Overall Financial Resource Strain (CARDIA)    Difficulty of Paying Living Expenses: Not hard at all  Food Insecurity: No Food Insecurity (12/23/2022)   Hunger Vital Sign    Worried About Running Out of Food in the Last Year: Never true    Ran Out of Food in the Last Year: Never true  Transportation Needs: No Transportation Needs (12/23/2022)   PRAPARE - Administrator, Civil Service (Medical): No    Lack of Transportation (Non-Medical): No  Physical Activity: Insufficiently Active (05/05/2022)   Exercise Vital Sign    Days of Exercise per Week: 3 days    Minutes of Exercise per Session: 30 min  Stress: No Stress Concern Present (05/05/2022)   [employer] of Occupational Health - Occupational Stress Questionnaire    Feeling of Stress : Not at all  Social Connections: Moderately Isolated (05/05/2022)   Social Connection and Isolation Panel [NHANES]    Frequency of Communication with Friends and Family: More than three times a week    Frequency of Social Gatherings with Friends and Family: Three times a week    Attends Religious Services: 1 to 4 times per year    Active  Member of Clubs or Organizations: No    Attends Engineer, structural: Never    Marital Status: Never married    Review of Systems Per HPI  Objective:  BP (!) 200/98   Pulse 60   Temp (!) 97.5 F (36.4 C)   Ht 5\' 3"  (1.6 m)   Wt 161 lb (73 kg)   SpO2 99%   BMI 28.52 kg/m      12/27/2022   10:46 AM 12/27/2022   10:11 AM 12/25/2022    8:45 AM  BP/Weight  Systolic BP 200 212 154  Diastolic BP 98 94 70  Wt. (Lbs)  161   BMI  28.52 kg/m2     Physical Exam Vitals and nursing note reviewed.  Constitutional:      General: She is not in acute distress.    Appearance: Normal appearance.  HENT:     Head: Normocephalic and atraumatic.  Cardiovascular:      Rate and Rhythm: Normal rate and regular rhythm.  Pulmonary:     Effort: Pulmonary effort is normal.     Breath sounds: Normal breath sounds. No wheezing or rales.  Neurological:     Mental Status: She is alert. Mental status is at baseline.  Psychiatric:        Mood and Affect: Mood normal.        Behavior: Behavior normal.     Lab Results  Component Value Date   WBC 7.6 12/23/2022   HGB 15.8 (H) 12/23/2022   HCT 45.7 12/23/2022   PLT 152 12/23/2022   GLUCOSE 97 12/23/2022   CHOL 187 12/23/2022   TRIG 71 12/23/2022   HDL 61 12/23/2022   LDLCALC 112 (H) 12/23/2022   ALT 22 12/23/2022   AST 17 12/23/2022   NA 136 12/23/2022   K 3.6 12/23/2022   CL 108 12/23/2022   CREATININE 1.10 (H) 12/23/2022   BUN 14 12/23/2022   CO2 21 (L) 12/23/2022   TSH 3.34 09/05/2018   INR 1.0 12/22/2022   HGBA1C 5.3 12/23/2022     Assessment & Plan:   Problem List Items Addressed This Visit       Cardiovascular and Mediastinum   Hypertensive urgency - Primary    Uncontrolled, worsening.  BP markedly elevated here today.  Continue losartan.  Adding amlodipine.  Follow-up in 1 week.      Relevant Medications   amLODipine (NORVASC) 5 MG tablet     Nervous and Auditory   Acute metabolic encephalopathy    Now resolved.  Unclear whether this was infectious related or secondary to TIA.  Await final results from the hospital.       Meds ordered this encounter  Medications   amLODipine (NORVASC) 5 MG tablet    Sig: Take 1 tablet (5 mg total) by mouth daily.    Dispense:  90 tablet    Refill:  3    Follow-up:  Return in about 1 week (around 01/03/2023) for HTN follow up.  Everlene Other DO [facility]

## 2022-12-27 NOTE — Assessment & Plan Note (Addendum)
Uncontrolled, worsening.  BP markedly elevated here today.  Continue losartan.  Adding amlodipine.  Follow-up in 1 week.

## 2022-12-29 LAB — LYME DISEASE DNA BY PCR(BORRELIA BURG): Lyme Disease(B.burgdorferi)PCR: NEGATIVE

## 2023-01-01 ENCOUNTER — Telehealth: Payer: Self-pay | Admitting: Family Medicine

## 2023-01-01 LAB — LYME DISEASE SEROLOGY W/REFLEX: Lyme Total Antibody EIA: NEGATIVE

## 2023-01-01 LAB — SPOTTED FEVER GROUP ANTIBODIES
Spotted Fever Group IgG: <1:64 {titer}
Spotted Fever Group IgM: <1:64 {titer}

## 2023-01-01 NOTE — Telephone Encounter (Signed)
Everlene Other G, DO     Testing negative.

## 2023-01-01 NOTE — Telephone Encounter (Signed)
Patient notified and verbalized understanding. 

## 2023-01-01 NOTE — Telephone Encounter (Signed)
Patient needs to know the results from a test she had done regarding a tick bite she has a appt with the infectious control dr and needed the results before her appt she would like a callback today.

## 2023-01-03 ENCOUNTER — Ambulatory Visit: Payer: Medicare Other | Admitting: Family Medicine

## 2023-01-03 ENCOUNTER — Encounter: Payer: Self-pay | Admitting: Family Medicine

## 2023-01-03 VITALS — BP 177/78 | HR 65 | Ht 63.0 in | Wt 156.0 lb

## 2023-01-03 DIAGNOSIS — I10 Essential (primary) hypertension: Secondary | ICD-10-CM | POA: Diagnosis not present

## 2023-01-03 MED ORDER — AMLODIPINE BESYLATE 10 MG PO TABS: 10.0000 mg | ORAL_TABLET | Freq: Every day | ORAL | 1 refills | Status: DC

## 2023-01-03 NOTE — Patient Instructions (Signed)
Increase Amlodipine to 10 mg daily.Follow up in 2 weeks.

## 2023-01-04 DIAGNOSIS — I10 Essential (primary) hypertension: Secondary | ICD-10-CM | POA: Insufficient documentation

## 2023-01-04 NOTE — Assessment & Plan Note (Signed)
BP remains uncontrolled.  Increasing amlodipine.

## 2023-01-04 NOTE — Progress Notes (Signed)
Subjective:  Patient ID: Joanne White, female    DOB: November 04, 1940  Age: 82 y.o. MRN: [id number]  CC:   Chief Complaint  Patient presents with   Hypertension    HPI:  82 year old female presents for follow-up regarding hypertension.  Patient's blood pressure continues to be elevated.  Home readings are better controlled than our readings here but still not at goal.  She is compliant with amlodipine 5 mg daily and losartan 50 mg daily.  Patient inquiring about her results from her hospitalization.  Essentially all of her labs were unrevealing.  Lyme negative.  CSF culture negative.  RMSF testing negative.  No malignant cells noted in CSF fluid.  HSV testing was negative as well.  Patient Active Problem List   Diagnosis Date Noted   Essential hypertension 01/04/2023   TIA (transient ischemic attack) 12/23/2022   Acute metabolic encephalopathy 12/23/2022   Tick bite 12/23/2022   Elevated hemoglobin (HCC) 12/01/2021   Mixed hyperlipidemia 02/14/2021   Allergy to alpha-gal 02/20/2019   Chronic kidney disease, stage 3a (HCC)     Social Hx   Social History   Socioeconomic History   Marital status: Single    Spouse name: Not on file   Number of children: Not on file   Years of education: Not on file   Highest education level: Not on file  Occupational History   Occupation: retired  Tobacco Use   Smoking status: Former    Current packs/day: 0.00    Types: Cigarettes    Quit date: 03/03/2011    Years since quitting: 11.8   Smokeless tobacco: Never  Vaping Use   Vaping status: Never Used  Substance and Sexual Activity   Alcohol use: Yes    Comment: 1 beer once or twice a week and a rare mixed drink   Drug use: No   Sexual activity: Not Currently  Other Topics Concern   Not on file  Social History Narrative   Not on file   Social Determinants of Health   Financial Resource Strain: Low Risk  (05/05/2022)   Overall Financial Resource Strain (CARDIA)    Difficulty of  Paying Living Expenses: Not hard at all  Food Insecurity: No Food Insecurity (12/23/2022)   Hunger Vital Sign    Worried About Running Out of Food in the Last Year: Never true    Ran Out of Food in the Last Year: Never true  Transportation Needs: No Transportation Needs (12/23/2022)   PRAPARE - Administrator, Civil Service (Medical): No    Lack of Transportation (Non-Medical): No  Physical Activity: Insufficiently Active (05/05/2022)   Exercise Vital Sign    Days of Exercise per Week: 3 days    Minutes of Exercise per Session: 30 min  Stress: No Stress Concern Present (05/05/2022)   [employer] of Occupational Health - Occupational Stress Questionnaire    Feeling of Stress : Not at all  Social Connections: Moderately Isolated (05/05/2022)   Social Connection and Isolation Panel [NHANES]    Frequency of Communication with Friends and Family: More than three times a week    Frequency of Social Gatherings with Friends and Family: Three times a week    Attends Religious Services: 1 to 4 times per year    Active Member of Clubs or Organizations: No    Attends Banker Meetings: Never    Marital Status: Never married    Review of Systems Per HPI  Objective:  BP Marland Kitchen)  177/78   Pulse 65   Ht 5\' 3"  (1.6 m)   Wt 156 lb (70.8 kg)   SpO2 99%   BMI 27.63 kg/m      01/03/2023   11:28 AM 01/03/2023   11:09 AM 12/27/2022   10:46 AM  BP/Weight  Systolic BP 177 195 200  Diastolic BP 78 79 98  Wt. (Lbs)  156   BMI  27.63 kg/m2     Physical Exam Vitals and nursing note reviewed.  Constitutional:      General: She is not in acute distress.    Appearance: Normal appearance.  HENT:     Head: Normocephalic and atraumatic.  Cardiovascular:     Rate and Rhythm: Normal rate and regular rhythm.  Pulmonary:     Effort: Pulmonary effort is normal.     Breath sounds: Normal breath sounds.  Neurological:     Mental Status: She is alert. Mental status is at  baseline.  Psychiatric:        Mood and Affect: Mood normal.        Behavior: Behavior normal.     Lab Results  Component Value Date   WBC 7.6 12/23/2022   HGB 15.8 (H) 12/23/2022   HCT 45.7 12/23/2022   PLT 152 12/23/2022   GLUCOSE 97 12/23/2022   CHOL 187 12/23/2022   TRIG 71 12/23/2022   HDL 61 12/23/2022   LDLCALC 112 (H) 12/23/2022   ALT 22 12/23/2022   AST 17 12/23/2022   NA 136 12/23/2022   K 3.6 12/23/2022   CL 108 12/23/2022   CREATININE 1.10 (H) 12/23/2022   BUN 14 12/23/2022   CO2 21 (L) 12/23/2022   TSH 3.34 09/05/2018   INR 1.0 12/22/2022   HGBA1C 5.3 12/23/2022     Assessment & Plan:   Problem List Items Addressed This Visit       Cardiovascular and Mediastinum   Essential hypertension - Primary    BP remains uncontrolled.  Increasing amlodipine.      Relevant Medications   amLODipine (NORVASC) 10 MG tablet    Meds ordered this encounter  Medications   amLODipine (NORVASC) 10 MG tablet    Sig: Take 1 tablet (10 mg total) by mouth daily.    Dispense:  90 tablet    Refill:  1    Follow-up:  Return in about 2 weeks (around 01/17/2023) for HTN follow up.  Everlene Other DO [facility]

## 2023-01-15 ENCOUNTER — Ambulatory Visit: Payer: Medicare Other | Admitting: Infectious Diseases

## 2023-01-17 DIAGNOSIS — D3132 Benign neoplasm of left choroid: Secondary | ICD-10-CM | POA: Diagnosis not present

## 2023-01-18 ENCOUNTER — Ambulatory Visit: Payer: Medicare Other | Admitting: Family Medicine

## 2023-01-18 VITALS — BP 152/62 | HR 71 | Temp 97.5°F | Ht 63.0 in | Wt 156.0 lb

## 2023-01-18 DIAGNOSIS — I10 Essential (primary) hypertension: Secondary | ICD-10-CM

## 2023-01-18 NOTE — Assessment & Plan Note (Signed)
Mildly elevated here today but stable home BP readings.  Continue amlodipine and losartan.  Follow-up in 6 months.

## 2023-01-18 NOTE — Patient Instructions (Signed)
Continue your medications.  Follow up in 6 months.  Take care  Dr. Avelino Herren  

## 2023-01-18 NOTE — Progress Notes (Signed)
Subjective:  Patient ID: Joanne White, female    DOB: Jul 08, 1940  Age: 82 y.o. MRN: [id number]  CC:   Chief Complaint  Patient presents with   Hypertension    HPI:  82 year old female presents for follow-up regarding hypertension.  BP mildly elevated here today and on repeat.  However, blood pressure reading at home this morning was 137/69.  She is feeling well.  Endorses compliance with amlodipine and losartan.  No chest pain or shortness of breath.  Feeling well.  Patient Active Problem List   Diagnosis Date Noted   Essential hypertension 01/04/2023   TIA (transient ischemic attack) 12/23/2022   Acute metabolic encephalopathy 12/23/2022   Elevated hemoglobin (HCC) 12/01/2021   Mixed hyperlipidemia 02/14/2021   Allergy to alpha-gal 02/20/2019   Chronic kidney disease, stage 3a (HCC)     Social Hx   Social History   Socioeconomic History   Marital status: Single    Spouse name: Not on file   Number of children: Not on file   Years of education: Not on file   Highest education level: Not on file  Occupational History   Occupation: retired  Tobacco Use   Smoking status: Former    Current packs/day: 0.00    Types: Cigarettes    Quit date: 03/03/2011    Years since quitting: 11.8   Smokeless tobacco: Never  Vaping Use   Vaping status: Never Used  Substance and Sexual Activity   Alcohol use: Yes    Comment: 1 beer once or twice a week and a rare mixed drink   Drug use: No   Sexual activity: Not Currently  Other Topics Concern   Not on file  Social History Narrative   Not on file   Social Determinants of Health   Financial Resource Strain: Low Risk  (05/05/2022)   Overall Financial Resource Strain (CARDIA)    Difficulty of Paying Living Expenses: Not hard at all  Food Insecurity: No Food Insecurity (12/23/2022)   Hunger Vital Sign    Worried About Running Out of Food in the Last Year: Never true    Ran Out of Food in the Last Year: Never true   Transportation Needs: No Transportation Needs (12/23/2022)   PRAPARE - Administrator, Civil Service (Medical): No    Lack of Transportation (Non-Medical): No  Physical Activity: Insufficiently Active (05/05/2022)   Exercise Vital Sign    Days of Exercise per Week: 3 days    Minutes of Exercise per Session: 30 min  Stress: No Stress Concern Present (05/05/2022)   [employer] of Occupational Health - Occupational Stress Questionnaire    Feeling of Stress : Not at all  Social Connections: Moderately Isolated (05/05/2022)   Social Connection and Isolation Panel [NHANES]    Frequency of Communication with Friends and Family: More than three times a week    Frequency of Social Gatherings with Friends and Family: Three times a week    Attends Religious Services: 1 to 4 times per year    Active Member of Clubs or Organizations: No    Attends Banker Meetings: Never    Marital Status: Never married    Review of Systems Per HPI  Objective:  BP (!) 152/62   Pulse 71   Temp (!) 97.5 F (36.4 C)   Ht 5\' 3"  (1.6 m)   Wt 156 lb (70.8 kg)   SpO2 99%   BMI 27.63 kg/m      01/18/2023  8:46 AM 01/18/2023    8:35 AM 01/03/2023   11:28 AM  BP/Weight  Systolic BP 152 152 177  Diastolic BP 62 68 78  Wt. (Lbs)  156   BMI  27.63 kg/m2     Physical Exam Vitals and nursing note reviewed.  Constitutional:      General: She is not in acute distress.    Appearance: Normal appearance.  HENT:     Head: Normocephalic and atraumatic.  Cardiovascular:     Rate and Rhythm: Normal rate and regular rhythm.  Pulmonary:     Effort: Pulmonary effort is normal.     Breath sounds: Normal breath sounds.  Neurological:     Mental Status: She is alert.  Psychiatric:        Mood and Affect: Mood normal.        Behavior: Behavior normal.     Lab Results  Component Value Date   WBC 7.6 12/23/2022   HGB 15.8 (H) 12/23/2022   HCT 45.7 12/23/2022   PLT 152 12/23/2022    GLUCOSE 97 12/23/2022   CHOL 187 12/23/2022   TRIG 71 12/23/2022   HDL 61 12/23/2022   LDLCALC 112 (H) 12/23/2022   ALT 22 12/23/2022   AST 17 12/23/2022   NA 136 12/23/2022   K 3.6 12/23/2022   CL 108 12/23/2022   CREATININE 1.10 (H) 12/23/2022   BUN 14 12/23/2022   CO2 21 (L) 12/23/2022   TSH 3.34 09/05/2018   INR 1.0 12/22/2022   HGBA1C 5.3 12/23/2022     Assessment & Plan:   Problem List Items Addressed This Visit       Cardiovascular and Mediastinum   Essential hypertension - Primary    Mildly elevated here today but stable home BP readings.  Continue amlodipine and losartan.  Follow-up in 6 months.       Follow-up:  6 months  Maralyn Witherell Adriana Simas DO [facility]

## 2023-01-26 ENCOUNTER — Other Ambulatory Visit: Payer: Self-pay | Admitting: Family Medicine

## 2023-02-12 ENCOUNTER — Other Ambulatory Visit: Payer: Self-pay | Admitting: Family Medicine

## 2023-02-12 ENCOUNTER — Telehealth: Payer: Self-pay

## 2023-02-12 MED ORDER — AMLODIPINE BESYLATE 5 MG PO TABS: 7.5000 mg | ORAL_TABLET | Freq: Every day | ORAL | 3 refills | Status: DC

## 2023-02-12 NOTE — Telephone Encounter (Signed)
Left message to return call -Patient needs to be notified Dr Adriana Simas switched her to Amlodipine 10mg  one tablet a day for her blood pressure and the script has been sent to [facility] pharmacy

## 2023-02-12 NOTE — Telephone Encounter (Signed)
Patient states she can't take 10mg  of Norvasc as prescribed and been doing 1.5 tabs of the 5mg  for 7.5mg  total and needs a new script for Norvasc 5mg  1.5 tabs a day

## 2023-02-12 NOTE — Telephone Encounter (Signed)
Need Refill for 90 day supply need on directions to take 1 1/2 tablets to equal to 7.5 she is assuming you want her to continue this medication.   [facility]

## 2023-02-13 NOTE — Telephone Encounter (Signed)
Cook, Jayce G, DO   ? ?Rx sent.   ? ?

## 2023-02-13 NOTE — Telephone Encounter (Signed)
Patient notified

## 2023-02-15 ENCOUNTER — Ambulatory Visit: Payer: Medicare Other | Admitting: Family Medicine

## 2023-02-19 NOTE — Telephone Encounter (Signed)
 Copied from CRM [id number]. Topic: Clinical - Prescription Issue >> Feb 19, 2023  8:25 AM Joanne White wrote: Reason for CRM: Patient was prescribed Atorvastain 20mg  in the hospital and she wants to know if she should still be taking it//She only has a few left

## 2023-02-20 ENCOUNTER — Other Ambulatory Visit: Payer: Self-pay | Admitting: Family Medicine

## 2023-02-20 MED ORDER — ATORVASTATIN CALCIUM 20 MG PO TABS: 20.0000 mg | ORAL_TABLET | Freq: Every day | ORAL | 2 refills | Status: DC

## 2023-02-20 NOTE — Telephone Encounter (Signed)
 Copied from CRM [id number]. Topic: Clinical - Medication Refill >> Feb 20, 2023  8:47 AM Antonio DEL wrote: Most Recent Primary Care Visit:  Provider: BLUFORD JACQULYN MATSU  Department: [facility]  Visit Type: OFFICE VISIT  Date: 01/18/2023  Medication: atorvastatin  (LIPITOR) 20 MG tablet  Has the patient contacted their pharmacy? Yes, she doesn't have refills  (Agent: If no, request that the patient contact the pharmacy for the refill. If patient does not wish to contact the pharmacy document the reason why and proceed with request.) (Agent: If yes, when and what did the pharmacy advise?)  Is this the correct pharmacy for this prescription? Yes If no, delete pharmacy and type the correct one.  This is the patient's preferred pharmacy:  [facility] [place], [place] - [id number] [place] [address] Phone: [phone] Fax: [phone]   Has the prescription been filled recently?   Is the patient out of the medication?   Has the patient been seen for an appointment in the last year OR does the patient have an upcoming appointment?   Can we respond through MyChart?   Agent: Please be advised that Rx refills may take up to 3 business days. We ask that you follow-up with your pharmacy.

## 2023-03-12 ENCOUNTER — Other Ambulatory Visit: Payer: Self-pay | Admitting: Family Medicine

## 2023-03-12 DIAGNOSIS — E782 Mixed hyperlipidemia: Secondary | ICD-10-CM

## 2023-04-07 ENCOUNTER — Other Ambulatory Visit: Payer: Self-pay | Admitting: Family Medicine

## 2023-05-11 NOTE — Progress Notes (Signed)
 This encounter was created in error - please disregard.

## 2023-07-05 ENCOUNTER — Ambulatory Visit: Payer: Self-pay | Admitting: Family Medicine

## 2023-07-05 NOTE — Telephone Encounter (Signed)
 Chief Complaint: Ankle swelling x1 week  Pertinent Negatives: Patient denies the swelling interfering with ability to walk, pain, difficulty breathing  Disposition:  [x] Appointment (In office)  Additional Notes: Patient states her ankles are swollen from the amlodipine . Pt scheduled for an appointment tomorrow in office.    Copied from CRM [id number]. Topic: Clinical - Red Word Triage >> Jul 05, 2023  4:51 PM Chuck Crater wrote: Red Word that prompted transfer to Nurse Triage: Patient ankles are swelling again from taking her amLODipine  (NORVASC ) 5 MG tablet. Reason for Disposition  MILD or MODERATE ankle swelling (e.g., can't move joint normally, can't do usual activities) (Exceptions: Itchy, localized swelling; swelling is chronic.)  Answer Assessment - Initial Assessment Questions Chief Complaint: Ankle swelling (bilateral) x1 week  Pertinent Negatives: Patient denies the swelling interfering with ability to walk, pain, difficulty breathing  Mild swelling  Protocols used: Ankle Swelling-A-AH

## 2023-07-06 ENCOUNTER — Ambulatory Visit: Admitting: Family Medicine

## 2023-07-19 ENCOUNTER — Encounter: Payer: Self-pay | Admitting: Family Medicine

## 2023-07-19 ENCOUNTER — Ambulatory Visit: Payer: Medicare Other | Admitting: Family Medicine

## 2023-07-19 ENCOUNTER — Ambulatory Visit: Admitting: Family Medicine

## 2023-07-19 VITALS — BP 132/70 | HR 67 | Temp 97.9°F | Ht 63.0 in | Wt 148.0 lb

## 2023-07-19 DIAGNOSIS — N1831 Chronic kidney disease, stage 3a: Secondary | ICD-10-CM

## 2023-07-19 DIAGNOSIS — I10 Essential (primary) hypertension: Secondary | ICD-10-CM | POA: Diagnosis not present

## 2023-07-19 DIAGNOSIS — E782 Mixed hyperlipidemia: Secondary | ICD-10-CM

## 2023-07-19 MED ORDER — LOSARTAN POTASSIUM 50 MG PO TABS: 50.0000 mg | ORAL_TABLET | Freq: Every day | ORAL | 3 refills | Status: AC

## 2023-07-19 NOTE — Assessment & Plan Note (Signed)
Lipid panel today to assess.  Continue statin. 

## 2023-07-19 NOTE — Assessment & Plan Note (Signed)
 Stable. Continue Norvasc  and Losartan . Losartan  refilled.

## 2023-07-19 NOTE — Patient Instructions (Signed)
 Labs today.  Follow up in 6 months.

## 2023-07-19 NOTE — Assessment & Plan Note (Signed)
 Stable. Continue ARB. Metabolic panel today.

## 2023-07-19 NOTE — Progress Notes (Signed)
 Subjective:  Patient ID: Joanne White, female    DOB: 12/08/1940  Age: 83 y.o. MRN: [id number]  CC:   Chief Complaint  Patient presents with   follow up htn, ckd    HPI:  83 year old female presents for follow-up.  Patient reports that she is doing well.  Blood pressure well-controlled.  She has had some recent swelling in her lower extremities but this has resolved.  She is compliant with amlodipine  and losartan .  Patient needs laboratory studies.  Patient due for DEXA scan.  She declines today.  Patient Active Problem List   Diagnosis Date Noted   Essential hypertension 01/04/2023   TIA (transient ischemic attack) 12/23/2022   Elevated hemoglobin (HCC) 12/01/2021   Mixed hyperlipidemia 02/14/2021   Allergy to alpha-gal 02/20/2019   Chronic kidney disease, stage 3a (HCC)     Social Hx   Social History   Socioeconomic History   Marital status: Single    Spouse name: Not on file   Number of children: Not on file   Years of education: Not on file   Highest education level: Not on file  Occupational History   Occupation: retired  Tobacco Use   Smoking status: Former    Current packs/day: 0.00    Types: Cigarettes    Quit date: 03/03/2011    Years since quitting: 12.3   Smokeless tobacco: Never  Vaping Use   Vaping status: Never Used  Substance and Sexual Activity   Alcohol use: Yes    Comment: 1 beer once or twice a week and a rare mixed drink   Drug use: No   Sexual activity: Not Currently  Other Topics Concern   Not on file  Social History Narrative   Not on file   Social Drivers of Health   Financial Resource Strain: Low Risk  (05/05/2022)   Overall Financial Resource Strain (CARDIA)    Difficulty of Paying Living Expenses: Not hard at all  Food Insecurity: No Food Insecurity (12/23/2022)   Hunger Vital Sign    Worried About Running Out of Food in the Last Year: Never true    Ran Out of Food in the Last Year: Never true  Transportation Needs: No  Transportation Needs (12/23/2022)   PRAPARE - Administrator, Civil Service (Medical): No    Lack of Transportation (Non-Medical): No  Physical Activity: Insufficiently Active (05/05/2022)   Exercise Vital Sign    Days of Exercise per Week: 3 days    Minutes of Exercise per Session: 30 min  Stress: No Stress Concern Present (05/05/2022)   [employer] of Occupational Health - Occupational Stress Questionnaire    Feeling of Stress : Not at all  Social Connections: Moderately Isolated (05/05/2022)   Social Connection and Isolation Panel [NHANES]    Frequency of Communication with Friends and Family: More than three times a week    Frequency of Social Gatherings with Friends and Family: Three times a week    Attends Religious Services: 1 to 4 times per year    Active Member of Clubs or Organizations: No    Attends Banker Meetings: Never    Marital Status: Never married    Review of Systems Per HPI  Objective:  BP 132/70   Pulse 67   Temp 97.9 F (36.6 C)   Ht 5\' 3"  (1.6 m)   Wt 148 lb (67.1 kg)   SpO2 99%   BMI 26.22 kg/m      07/19/2023  10:21 AM 01/18/2023    8:46 AM 01/18/2023    8:35 AM  BP/Weight  Systolic BP 132 152 152  Diastolic BP 70 62 68  Wt. (Lbs) 148  156  BMI 26.22 kg/m2  27.63 kg/m2    Physical Exam Vitals and nursing note reviewed.  Constitutional:      General: She is not in acute distress.    Appearance: Normal appearance.  HENT:     Head: Normocephalic and atraumatic.  Cardiovascular:     Rate and Rhythm: Normal rate and regular rhythm.  Pulmonary:     Effort: Pulmonary effort is normal.     Breath sounds: Normal breath sounds. No wheezing, rhonchi or rales.  Neurological:     Mental Status: She is alert.  Psychiatric:        Mood and Affect: Mood normal.        Behavior: Behavior normal.     Lab Results  Component Value Date   WBC 7.6 12/23/2022   HGB 15.8 (H) 12/23/2022   HCT 45.7 12/23/2022   PLT 152  12/23/2022   GLUCOSE 97 12/23/2022   CHOL 187 12/23/2022   TRIG 71 12/23/2022   HDL 61 12/23/2022   LDLCALC 112 (H) 12/23/2022   ALT 22 12/23/2022   AST 17 12/23/2022   NA 136 12/23/2022   K 3.6 12/23/2022   CL 108 12/23/2022   CREATININE 1.10 (H) 12/23/2022   BUN 14 12/23/2022   CO2 21 (L) 12/23/2022   TSH 3.34 09/05/2018   INR 1.0 12/22/2022   HGBA1C 5.3 12/23/2022     Assessment & Plan:  Essential hypertension Assessment & Plan: Stable. Continue Norvasc  and Losartan . Losartan  refilled.  Orders: -     Losartan  Potassium; Take 1 tablet (50 mg total) by mouth daily.  Dispense: 90 tablet; Refill: 3  Chronic kidney disease, stage 3a (HCC) Assessment & Plan: Stable. Continue ARB. Metabolic panel today.  Orders: -     Losartan  Potassium; Take 1 tablet (50 mg total) by mouth daily.  Dispense: 90 tablet; Refill: 3 -     CBC -     CMP14+EGFR -     Microalbumin / creatinine urine ratio  Mixed hyperlipidemia Assessment & Plan: Lipid panel today to assess. Continue statin.  Orders: -     Lipid panel    Follow-up:  6 months  Davinity Fanara Debrah Fan DO [facility]

## 2023-07-20 LAB — LIPID PANEL
Chol/HDL Ratio: 2 ratio (ref 0.0–4.4)
Cholesterol, Total: 158 mg/dL (ref 100–199)
HDL: 78 mg/dL (ref >39)
LDL Chol Calc (NIH): 60 mg/dL (ref 0–99)
Triglycerides: 113 mg/dL (ref 0–149)
VLDL Cholesterol Cal: 20 mg/dL (ref 5–40)

## 2023-07-20 LAB — CMP14+EGFR
ALT: 14 IU/L (ref 0–32)
AST: 18 IU/L (ref 0–40)
Albumin: 4.6 g/dL (ref 3.7–4.7)
Alkaline Phosphatase: 102 IU/L (ref 44–121)
BUN/Creatinine Ratio: 18 (ref 12–28)
BUN: 18 mg/dL (ref 8–27)
Bilirubin Total: 0.7 mg/dL (ref 0.0–1.2)
CO2: 24 mmol/L (ref 20–29)
Calcium: 9.9 mg/dL (ref 8.7–10.3)
Chloride: 103 mmol/L (ref 96–106)
Creatinine, Ser: 1.02 mg/dL — ABNORMAL HIGH (ref 0.57–1.00)
Globulin, Total: 2.5 g/dL (ref 1.5–4.5)
Glucose: 84 mg/dL (ref 70–99)
Potassium: 4.4 mmol/L (ref 3.5–5.2)
Sodium: 141 mmol/L (ref 134–144)
Total Protein: 7.1 g/dL (ref 6.0–8.5)
eGFR: 55 mL/min/{1.73_m2} — ABNORMAL LOW (ref >59)

## 2023-07-20 LAB — MICROALBUMIN / CREATININE URINE RATIO
Creatinine, Urine: 27.3 mg/dL
Microalb/Creat Ratio: <11 mg/g{creat} (ref 0–29)
Microalbumin, Urine: <3 ug/mL

## 2023-07-20 LAB — CBC
Hematocrit: 45.3 % (ref 34.0–46.6)
Hemoglobin: 15.6 g/dL (ref 11.1–15.9)
MCH: 34.7 pg — ABNORMAL HIGH (ref 26.6–33.0)
MCHC: 34.4 g/dL (ref 31.5–35.7)
MCV: 101 fL — ABNORMAL HIGH (ref 79–97)
Platelets: 263 10*3/uL (ref 150–450)
RBC: 4.5 x10E6/uL (ref 3.77–5.28)
RDW: 12.3 % (ref 11.7–15.4)
WBC: 8.8 10*3/uL (ref 3.4–10.8)

## 2023-07-22 ENCOUNTER — Ambulatory Visit: Payer: Self-pay | Admitting: Family Medicine

## 2023-07-22 DIAGNOSIS — R899 Unspecified abnormal finding in specimens from other organs, systems and tissues: Secondary | ICD-10-CM

## 2023-08-07 DIAGNOSIS — R899 Unspecified abnormal finding in specimens from other organs, systems and tissues: Secondary | ICD-10-CM | POA: Diagnosis not present

## 2023-08-08 LAB — FOLATE: Folate: 14.1 ng/mL (ref >3.0)

## 2023-08-08 LAB — VITAMIN B12: Vitamin B-12: 266 pg/mL (ref 232–1245)

## 2023-10-01 ENCOUNTER — Encounter: Payer: Self-pay | Admitting: Family Medicine

## 2023-10-01 ENCOUNTER — Ambulatory Visit (INDEPENDENT_AMBULATORY_CARE_PROVIDER_SITE_OTHER): Admitting: Family Medicine

## 2023-10-01 VITALS — BP 132/68 | HR 63 | Temp 97.2°F | Ht 63.0 in | Wt 148.0 lb

## 2023-10-01 DIAGNOSIS — R21 Rash and other nonspecific skin eruption: Secondary | ICD-10-CM | POA: Diagnosis not present

## 2023-10-01 MED ORDER — TRIAMCINOLONE ACETONIDE 40 MG/ML IJ SUSP
40.0000 mg | Freq: Once | INTRAMUSCULAR | Status: AC
  Administered 2023-10-01: 40 mg via INTRAMUSCULAR

## 2023-10-01 NOTE — Progress Notes (Signed)
 Subjective:  Patient ID: Joanne White, female    DOB: 1940-09-13  Age: 83 y.o. MRN: [id number]  CC:   Chief Complaint  Patient presents with   bug bites on lower legs    [place]    HPI:  83 year old female presents for evaluation of the above.  [place]  [place] she noticed that her lower legs were burning and were uncomfortable.  Patient noted numerous raised areas.  Associated itching.  Patient suspects that she was bitten by an insect of some sort.  No relieving factors.  No other complaints.  Patient Active Problem List   Diagnosis Date Noted   Rash 10/01/2023   Essential hypertension 01/04/2023   TIA (transient ischemic attack) 12/23/2022   Elevated hemoglobin (HCC) 12/01/2021   Mixed hyperlipidemia 02/14/2021   Allergy to alpha-gal 02/20/2019   Chronic kidney disease, stage 3a (HCC)     Social Hx   Social History   Socioeconomic History   Marital status: Single    Spouse name: Not on file   Number of children: Not on file   Years of education: Not on file   Highest education level: Not on file  Occupational History   Occupation: retired  Tobacco Use   Smoking status: Former    Current packs/day: 0.00    Types: Cigarettes    Quit date: 03/03/2011    Years since quitting: 12.5   Smokeless tobacco: Never  Vaping Use   Vaping status: Never Used  Substance and Sexual Activity   Alcohol use: Yes    Comment: 1 beer once or twice a week and a rare mixed drink   Drug use: No   Sexual activity: Not Currently  Other Topics Concern   Not on file  Social History Narrative   Not on file   Social Drivers of Health   Financial Resource Strain: Low Risk  (05/05/2022)   Overall Financial Resource Strain (CARDIA)    Difficulty of Paying Living Expenses: Not hard at all  Food Insecurity: No Food Insecurity (12/23/2022)   Hunger Vital Sign    Worried About Running  Out of Food in the Last Year: Never true    Ran Out of Food in the Last Year: Never true  Transportation Needs: No Transportation Needs (12/23/2022)   PRAPARE - Administrator, Civil Service (Medical): No    Lack of Transportation (Non-Medical): No  Physical Activity: Insufficiently Active (05/05/2022)   Exercise Vital Sign    Days of Exercise per Week: 3 days    Minutes of Exercise per Session: 30 min  Stress: No Stress Concern Present (05/05/2022)   [employer] of Occupational Health - Occupational Stress Questionnaire    Feeling of Stress : Not at all  Social Connections: Moderately Isolated (05/05/2022)   Social Connection and Isolation Panel    Frequency of Communication with Friends and Family: More than three times a week    Frequency of Social Gatherings with Friends and Family: Three times a week    Attends Religious Services: 1 to 4 times per year    Active Member of Clubs or Organizations: No    Attends Banker Meetings: Never    Marital Status: Never married    Review of Systems Per HPI  Objective:  BP 132/68   Pulse 63   Temp (!) 97.2 F (36.2 C)  Ht 5' 3 (1.6 m)   Wt 148 lb (67.1 kg)   SpO2 99%   BMI 26.22 kg/m      10/01/2023   11:31 AM 10/01/2023   11:08 AM 07/19/2023   10:21 AM  BP/Weight  Systolic BP 132 160 132  Diastolic BP 68 75 70  Wt. (Lbs)  148 148  BMI  26.22 kg/m2 26.22 kg/m2    Physical Exam Vitals and nursing note reviewed.  Constitutional:      General: She is not in acute distress.    Appearance: Normal appearance.  Pulmonary:     Effort: Pulmonary effort is normal. No respiratory distress.  Skin:    Comments: Numerous erythematous papules noted on the left lower extremity.  Several also noted on the right lower extremity.  Neurological:     Mental Status: She is alert.  Psychiatric:        Mood and Affect: Mood normal.        Behavior: Behavior normal.     Lab Results  Component Value Date   WBC  8.8 07/19/2023   HGB 15.6 07/19/2023   HCT 45.3 07/19/2023   PLT 263 07/19/2023   GLUCOSE 84 07/19/2023   CHOL 158 07/19/2023   TRIG 113 07/19/2023   HDL 78 07/19/2023   LDLCALC 60 07/19/2023   ALT 14 07/19/2023   AST 18 07/19/2023   NA 141 07/19/2023   K 4.4 07/19/2023   CL 103 07/19/2023   CREATININE 1.02 (H) 07/19/2023   BUN 18 07/19/2023   CO2 24 07/19/2023   TSH 3.34 09/05/2018   INR 1.0 12/22/2022   HGBA1C 5.3 12/23/2022     Assessment & Plan:  Rash Assessment & Plan: Secondary to numerous bug bites.  Discussed oral corticosteroids.  Patient declined.  Patient elected for injectable corticosteroids.  IM triamcinolone  given today.  Orders: -     Triamcinolone  Acetonide    Follow-up:  Return if symptoms worsen or fail to improve.  Jacqulyn Ahle DO [facility]

## 2023-10-01 NOTE — Patient Instructions (Signed)
Call with concerns. ° °Take care ° °Dr. Ezmeralda Stefanick  °

## 2023-10-01 NOTE — Assessment & Plan Note (Signed)
 Secondary to numerous bug bites.  Discussed oral corticosteroids.  Patient declined.  Patient elected for injectable corticosteroids.  IM triamcinolone  given today.

## 2023-12-07 ENCOUNTER — Ambulatory Visit

## 2024-01-18 ENCOUNTER — Ambulatory Visit: Admitting: Family Medicine

## 2024-01-23 ENCOUNTER — Ambulatory Visit: Admitting: Family Medicine

## 2024-01-23 VITALS — BP 122/84 | HR 67 | Temp 97.4°F | Ht 63.0 in | Wt 148.0 lb

## 2024-01-23 DIAGNOSIS — I10 Essential (primary) hypertension: Secondary | ICD-10-CM

## 2024-01-23 DIAGNOSIS — E782 Mixed hyperlipidemia: Secondary | ICD-10-CM

## 2024-01-23 DIAGNOSIS — N1831 Chronic kidney disease, stage 3a: Secondary | ICD-10-CM

## 2024-01-23 MED ORDER — AMLODIPINE BESYLATE 5 MG PO TABS: 7.5000 mg | ORAL_TABLET | Freq: Every day | ORAL | 3 refills | Status: AC

## 2024-01-23 MED ORDER — ATORVASTATIN CALCIUM 20 MG PO TABS: 20.0000 mg | ORAL_TABLET | Freq: Every day | ORAL | 3 refills | Status: AC

## 2024-01-23 MED ORDER — ASPIRIN 81 MG PO TBEC: 81.0000 mg | DELAYED_RELEASE_TABLET | Freq: Every day | ORAL | 12 refills | Status: DC

## 2024-01-23 MED ORDER — ASPIRIN 81 MG PO TBEC: 81.0000 mg | DELAYED_RELEASE_TABLET | Freq: Every day | ORAL | 3 refills | Status: AC

## 2024-01-23 NOTE — Assessment & Plan Note (Signed)
Lipids at goal on Lipitor.  Continue.

## 2024-01-23 NOTE — Progress Notes (Signed)
 Subjective:  Patient ID: Joanne White, female    DOB: Nov 26, 1940  Age: 83 y.o. MRN: [id number]  CC:   Chief Complaint  Patient presents with   Follow-up    Patient is here for a follow up.  Wants to go over labs from last December    HPI:  83 year old female presents for follow-up.  Patient states that she is doing well.  She is already received her flu vaccine in September.  Blood pressure is well-controlled.  This is quite good that she is normally hypertensive.  She is compliant with amlodipine  and losartan .  Patient is LDL at goal on atorvastatin .  She is tolerating.  Patient is feeling well.  She has no complaints or concerns at this time.  Patient Active Problem List   Diagnosis Date Noted   Essential hypertension 01/04/2023   TIA (transient ischemic attack) 12/23/2022   Mixed hyperlipidemia 02/14/2021   Allergy to alpha-gal 02/20/2019   Chronic kidney disease, stage 3a (HCC)     Social Hx   Social History   Socioeconomic History   Marital status: Single    Spouse name: Not on file   Number of children: Not on file   Years of education: Not on file   Highest education level: Not on file  Occupational History   Occupation: retired  Tobacco Use   Smoking status: Former    Current packs/day: 0.00    Types: Cigarettes    Quit date: 03/03/2011    Years since quitting: 12.9   Smokeless tobacco: Never  Vaping Use   Vaping status: Never Used  Substance and Sexual Activity   Alcohol use: Yes    Comment: 1 beer once or twice a week and a rare mixed drink   Drug use: No   Sexual activity: Not Currently  Other Topics Concern   Not on file  Social History Narrative   Not on file   Social Drivers of Health   Financial Resource Strain: Low Risk  (05/05/2022)   Overall Financial Resource Strain (CARDIA)    Difficulty of Paying Living Expenses: Not hard at all  Food Insecurity: No Food Insecurity (12/23/2022)   Hunger Vital Sign    Worried About Running Out  of Food in the Last Year: Never true    Ran Out of Food in the Last Year: Never true  Transportation Needs: No Transportation Needs (12/23/2022)   PRAPARE - Administrator, Civil Service (Medical): No    Lack of Transportation (Non-Medical): No  Physical Activity: Insufficiently Active (05/05/2022)   Exercise Vital Sign    Days of Exercise per Week: 3 days    Minutes of Exercise per Session: 30 min  Stress: No Stress Concern Present (05/05/2022)   [employer] of Occupational Health - Occupational Stress Questionnaire    Feeling of Stress : Not at all  Social Connections: Moderately Isolated (05/05/2022)   Social Connection and Isolation Panel    Frequency of Communication with Friends and Family: More than three times a week    Frequency of Social Gatherings with Friends and Family: Three times a week    Attends Religious Services: 1 to 4 times per year    Active Member of Clubs or Organizations: No    Attends Banker Meetings: Never    Marital Status: Never married    Review of Systems Per HPI  Objective:  BP 122/84 (BP Location: Left Arm, Patient Position: Sitting)   Pulse 67  Temp (!) 97.4 F (36.3 C)   Ht 5' 3 (1.6 m)   Wt 148 lb (67.1 kg)   SpO2 99%   BMI 26.22 kg/m      01/23/2024    9:40 AM 10/01/2023   11:31 AM 10/01/2023   11:08 AM  BP/Weight  Systolic BP 122 132 160  Diastolic BP 84 68 75  Wt. (Lbs) 148  148  BMI 26.22 kg/m2  26.22 kg/m2    Physical Exam Vitals and nursing note reviewed.  Constitutional:      General: She is not in acute distress.    Appearance: Normal appearance.  HENT:     Head: Normocephalic and atraumatic.  Eyes:     General:        Right eye: No discharge.        Left eye: No discharge.     Conjunctiva/sclera: Conjunctivae normal.  Cardiovascular:     Rate and Rhythm: Normal rate and regular rhythm.  Pulmonary:     Effort: Pulmonary effort is normal.     Breath sounds: Normal breath sounds. No  wheezing, rhonchi or rales.  Neurological:     Mental Status: She is alert.  Psychiatric:        Mood and Affect: Mood normal.        Behavior: Behavior normal.     Lab Results  Component Value Date   WBC 8.8 07/19/2023   HGB 15.6 07/19/2023   HCT 45.3 07/19/2023   PLT 263 07/19/2023   GLUCOSE 84 07/19/2023   CHOL 158 07/19/2023   TRIG 113 07/19/2023   HDL 78 07/19/2023   LDLCALC 60 07/19/2023   ALT 14 07/19/2023   AST 18 07/19/2023   NA 141 07/19/2023   K 4.4 07/19/2023   CL 103 07/19/2023   CREATININE 1.02 (H) 07/19/2023   BUN 18 07/19/2023   CO2 24 07/19/2023   TSH 3.34 09/05/2018   INR 1.0 12/22/2022   HGBA1C 5.3 12/23/2022     Assessment & Plan:  Essential hypertension Assessment & Plan: BP well-controlled.  Continue amlodipine  and losartan .  Orders: -     amLODIPine  Besylate; Take 1.5 tablets (7.5 mg total) by mouth daily.  Dispense: 135 tablet; Refill: 3  Chronic kidney disease, stage 3a (HCC) Assessment & Plan: Stable.  Last GFR 55.  On losartan .   Mixed hyperlipidemia Assessment & Plan: Lipids at goal on Lipitor.  Continue.  Orders: -     Atorvastatin  Calcium ; Take 1 tablet (20 mg total) by mouth daily.  Dispense: 90 tablet; Refill: 3  Other orders -     Aspirin ; Take 1 tablet (81 mg total) by mouth daily. Swallow whole.  Dispense: 90 tablet; Refill: 3    Follow-up: 6 months  Aileena Iglesia Bluford DO [facility]

## 2024-01-23 NOTE — Patient Instructions (Signed)
You're doing well    Follow-up in 6 months

## 2024-01-23 NOTE — Assessment & Plan Note (Signed)
-   BP well controlled. - Continue amlodipine  and losartan .

## 2024-01-23 NOTE — Assessment & Plan Note (Signed)
 Stable.  Last GFR 55.  On losartan .

## 2024-04-10 ENCOUNTER — Ambulatory Visit

## 2024-07-23 ENCOUNTER — Ambulatory Visit: Admitting: Family Medicine
